# Patient Record
Sex: Male | Born: 1950 | Race: White | Hispanic: No | Marital: Married | Smoking: Never smoker
Health system: Southern US, Community
[De-identification: ages and names within clinical notes are randomized; demographics above are authoritative.]

## PROBLEM LIST (undated history)

## (undated) DIAGNOSIS — I480 Paroxysmal atrial fibrillation: Secondary | ICD-10-CM

## (undated) DIAGNOSIS — E119 Type 2 diabetes mellitus without complications: Secondary | ICD-10-CM

## (undated) DIAGNOSIS — Z9889 Other specified postprocedural states: Secondary | ICD-10-CM

## (undated) DIAGNOSIS — M545 Low back pain, unspecified: Secondary | ICD-10-CM

## (undated) DIAGNOSIS — G4700 Insomnia, unspecified: Secondary | ICD-10-CM

## (undated) DIAGNOSIS — S83519A Sprain of anterior cruciate ligament of unspecified knee, initial encounter: Secondary | ICD-10-CM

## (undated) DIAGNOSIS — K429 Umbilical hernia without obstruction or gangrene: Secondary | ICD-10-CM

## (undated) DIAGNOSIS — E669 Obesity, unspecified: Secondary | ICD-10-CM

## (undated) DIAGNOSIS — E7800 Pure hypercholesterolemia, unspecified: Secondary | ICD-10-CM

## (undated) DIAGNOSIS — E785 Hyperlipidemia, unspecified: Secondary | ICD-10-CM

## (undated) DIAGNOSIS — M25511 Pain in right shoulder: Secondary | ICD-10-CM

## (undated) DIAGNOSIS — Z9989 Dependence on other enabling machines and devices: Secondary | ICD-10-CM

## (undated) DIAGNOSIS — I4892 Unspecified atrial flutter: Secondary | ICD-10-CM

## (undated) DIAGNOSIS — S8290XA Unspecified fracture of unspecified lower leg, initial encounter for closed fracture: Secondary | ICD-10-CM

## (undated) DIAGNOSIS — I10 Essential (primary) hypertension: Secondary | ICD-10-CM

## (undated) DIAGNOSIS — I82409 Acute embolism and thrombosis of unspecified deep veins of unspecified lower extremity: Secondary | ICD-10-CM

## (undated) DIAGNOSIS — Z8679 Personal history of other diseases of the circulatory system: Secondary | ICD-10-CM

## (undated) DIAGNOSIS — G4733 Obstructive sleep apnea (adult) (pediatric): Secondary | ICD-10-CM

## (undated) DIAGNOSIS — I4891 Unspecified atrial fibrillation: Secondary | ICD-10-CM

## (undated) HISTORY — DX: Dependence on other enabling machines and devices: Z99.89

## (undated) HISTORY — DX: Obstructive sleep apnea (adult) (pediatric): G47.33

## (undated) HISTORY — DX: Acute embolism and thrombosis of unspecified deep veins of unspecified lower extremity: I82.409

## (undated) HISTORY — DX: Low back pain: M54.5

## (undated) HISTORY — DX: Hyperlipidemia, unspecified: E78.5

## (undated) HISTORY — DX: Paroxysmal atrial fibrillation: I48.0

## (undated) HISTORY — DX: Sprain of anterior cruciate ligament of unspecified knee, initial encounter: S83.519A

## (undated) HISTORY — DX: Pain in right shoulder: M25.511

## (undated) HISTORY — DX: Unspecified fracture of unspecified lower leg, initial encounter for closed fracture: S82.90XA

## (undated) HISTORY — DX: Pure hypercholesterolemia, unspecified: E78.00

## (undated) HISTORY — DX: Unspecified atrial fibrillation: I48.91

## (undated) HISTORY — DX: Personal history of other diseases of the circulatory system: Z86.79

## (undated) HISTORY — DX: Insomnia, unspecified: G47.00

## (undated) HISTORY — DX: Essential (primary) hypertension: I10

## (undated) HISTORY — DX: Other specified postprocedural states: Z98.890

## (undated) HISTORY — DX: Obesity, unspecified: E66.9

## (undated) HISTORY — DX: Low back pain, unspecified: M54.50

## (undated) HISTORY — DX: Umbilical hernia without obstruction or gangrene: K42.9

## (undated) HISTORY — DX: Unspecified atrial flutter: I48.92

---
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
## 1994-03-15 HISTORY — PX: OTHER SURGICAL HISTORY: SHX169

## 1995-03-16 HISTORY — PX: OTHER SURGICAL HISTORY: SHX169

## 2000-09-16 ENCOUNTER — Inpatient Hospital Stay (HOSPITAL_COMMUNITY): Admission: AD | Admit: 2000-09-16 | Discharge: 2000-09-19 | Payer: Self-pay | Admitting: Interventional Cardiology

## 2001-12-26 ENCOUNTER — Ambulatory Visit (HOSPITAL_COMMUNITY): Admission: RE | Admit: 2001-12-26 | Discharge: 2001-12-26 | Payer: Self-pay | Admitting: Interventional Cardiology

## 2001-12-26 HISTORY — PX: CARDIAC CATHETERIZATION: SHX172

## 2002-01-13 HISTORY — PX: OTHER SURGICAL HISTORY: SHX169

## 2002-02-01 ENCOUNTER — Ambulatory Visit (HOSPITAL_COMMUNITY): Admission: RE | Admit: 2002-02-01 | Discharge: 2002-02-02 | Payer: Self-pay | Admitting: Internal Medicine

## 2002-02-04 ENCOUNTER — Emergency Department (HOSPITAL_COMMUNITY): Admission: EM | Admit: 2002-02-04 | Discharge: 2002-02-04 | Payer: Self-pay | Admitting: Emergency Medicine

## 2006-03-15 DIAGNOSIS — Z8679 Personal history of other diseases of the circulatory system: Secondary | ICD-10-CM

## 2006-03-15 DIAGNOSIS — Z9889 Other specified postprocedural states: Secondary | ICD-10-CM

## 2006-03-15 HISTORY — DX: Personal history of other diseases of the circulatory system: Z86.79

## 2006-03-15 HISTORY — DX: Other specified postprocedural states: Z98.890

## 2010-03-15 DIAGNOSIS — I4891 Unspecified atrial fibrillation: Secondary | ICD-10-CM

## 2010-03-15 HISTORY — DX: Unspecified atrial fibrillation: I48.91

## 2010-04-04 ENCOUNTER — Encounter: Payer: Self-pay | Admitting: Interventional Cardiology

## 2010-12-21 ENCOUNTER — Emergency Department (HOSPITAL_COMMUNITY): Payer: BC Managed Care – PPO

## 2010-12-21 ENCOUNTER — Emergency Department (HOSPITAL_COMMUNITY)
Admission: EM | Admit: 2010-12-21 | Discharge: 2010-12-21 | Disposition: A | Discharge status: Home / Self Care | Payer: BC Managed Care – PPO | Attending: Emergency Medicine | Admitting: Emergency Medicine

## 2010-12-21 DIAGNOSIS — I10 Essential (primary) hypertension: Secondary | ICD-10-CM | POA: Insufficient documentation

## 2010-12-21 DIAGNOSIS — I4891 Unspecified atrial fibrillation: Secondary | ICD-10-CM | POA: Insufficient documentation

## 2010-12-21 DIAGNOSIS — E7800 Pure hypercholesterolemia, unspecified: Secondary | ICD-10-CM | POA: Insufficient documentation

## 2010-12-21 DIAGNOSIS — R002 Palpitations: Secondary | ICD-10-CM | POA: Insufficient documentation

## 2010-12-21 LAB — CBC
HCT: 42.8 % (ref 39.0–52.0)
MCHC: 35.3 g/dL (ref 30.0–36.0)
Platelets: 180 10*3/uL (ref 150–400)
RDW: 13.4 % (ref 11.5–15.5)
WBC: 6.9 10*3/uL (ref 4.0–10.5)

## 2010-12-21 LAB — DIFFERENTIAL
Basophils Absolute: 0.1 10*3/uL (ref 0.0–0.1)
Basophils Relative: 1 % (ref 0–1)
Eosinophils Absolute: 0.7 10*3/uL (ref 0.0–0.7)
Eosinophils Relative: 10 % — ABNORMAL HIGH (ref 0–5)
Monocytes Absolute: 0.6 10*3/uL (ref 0.1–1.0)

## 2010-12-21 LAB — PROTIME-INR: INR: 1 (ref 0.00–1.49)

## 2010-12-21 LAB — D-DIMER, QUANTITATIVE: D-Dimer, Quant: 0.28 ug/mL-FEU (ref 0.00–0.48)

## 2010-12-21 LAB — APTT: aPTT: 27 seconds (ref 24–37)

## 2010-12-21 LAB — BASIC METABOLIC PANEL
Calcium: 9.6 mg/dL (ref 8.4–10.5)
Chloride: 103 mEq/L (ref 96–112)
Creatinine, Ser: 1 mg/dL (ref 0.50–1.35)
GFR calc Af Amer: >90 mL/min (ref >90)
GFR calc non Af Amer: 81 mL/min — ABNORMAL LOW (ref >90)

## 2010-12-21 LAB — POCT I-STAT TROPONIN I

## 2010-12-30 ENCOUNTER — Institutional Professional Consult (permissible substitution): Payer: BC Managed Care – PPO | Admitting: Internal Medicine

## 2011-01-20 ENCOUNTER — Other Ambulatory Visit: Payer: Self-pay

## 2011-01-21 ENCOUNTER — Ambulatory Visit (INDEPENDENT_AMBULATORY_CARE_PROVIDER_SITE_OTHER): Payer: BC Managed Care – PPO | Admitting: Internal Medicine

## 2011-01-21 ENCOUNTER — Encounter: Payer: Self-pay | Admitting: Internal Medicine

## 2011-01-21 DIAGNOSIS — I4891 Unspecified atrial fibrillation: Secondary | ICD-10-CM | POA: Insufficient documentation

## 2011-01-21 DIAGNOSIS — I10 Essential (primary) hypertension: Secondary | ICD-10-CM

## 2011-01-21 MED ORDER — FLECAINIDE ACETATE 100 MG PO TABS: ORAL_TABLET | ORAL | Status: DC

## 2011-01-21 NOTE — Assessment & Plan Note (Signed)
Today we discussed the treatment options regarding his atrial fibrillation. One option will be just to continue his current medical therapy. A second option would be to try flecainide. This could be delivered either on a daily basis taking 100 mg twice a day or as a pill in the pocket taking 200 mg when he goes into atrial fibrillation. We discussed the advantages and disadvantages of each approach. For now he will try the pill in the pocket treatment with flecainide 200 mg taken as needed and the patient has gone into atrial fibrillation for more than 30 minutes. Ultimately, if he ends up taking flecainide several times a week, and I will recommend regular twice daily therapy.

## 2011-01-21 NOTE — Patient Instructions (Signed)
Your physician has recommended you make the following change in your medication:  1) Take Flecainide 100mg  two tablets as needed.  Your physician recommends that you schedule a follow-up appointment in: 3-4 months with Dr. Ladona Ridgel.

## 2011-01-21 NOTE — Progress Notes (Signed)
HPI Carlos Gutierrez is referred today by Dr. Katrinka Blazing for evaluation of atrial fibrillation. The patient is a 60 year old man who has a remote history of atrial flutter. In 2003, he underwent catheter ablation of typical atrial flutter restoring sinus rhythm. He had done well since then until approximately 2 months ago when he noted recurrent palpitations and was found to have atrial fibrillation. The patient has had 4 episodes. These typically last about a half a day. There is no syncope. No chest pain or shortness of breath. When I asked the patient how he feels and how he knows whether he is in atrial fibrillation or not, he states that he just does not feel quite right.  No Known Allergies   Current Outpatient Prescriptions  Medication Sig Dispense Refill  . aspirin 325 MG tablet Take 325 mg by mouth daily.        [name] atorvastatin (LIPITOR) 20 MG tablet Take 20 mg by mouth daily.        [name] loratadine (CLARITIN) 10 MG tablet Take 10 mg by mouth as needed.        . Misc Natural Products (ENERGY SUPPORT) CAPS Take 1 capsule by mouth daily.        . Multiple Vitamins-Minerals (MULTIVITAMIN WITH MINERALS) tablet Take 1 tablet by mouth daily.        . NON FORMULARY CPAP-as directed       . olmesartan-hydrochlorothiazide (BENICAR HCT) 20-12.5 MG per tablet Take 1 tablet by mouth daily.        [name] zolpidem (AMBIEN CR) 12.5 MG CR tablet Take 12.5 mg by mouth at bedtime as needed.        . flecainide (TAMBOCOR) 100 MG tablet Take two tablets as needed  60 tablet  1     Past Medical History  Diagnosis Date  . Dyslipidemia   . Concussion 1966    History of 4  . Broken leg 1968  . HTN (hypertension)   . Anxiety   . Status post ablation of atrial flutter 2008  . Lower back pain   . Obstructive sleep apnea on CPAP   . Atrial fibrillation 2012    ROS:   All systems reviewed and negative except as noted in the HPI.   Past Surgical History  Procedure Date  . Right knee surgery 1996    ACL, meniscus  problems  . Coronary angiography 1997    40% stenosis  . Ablation for atrial flutter 01/2002     Family History  Problem Relation Age of Onset  . Heart disease Father 2  . Hypertension Mother 52  . Diabetes Brother 42  . Hypertension Father   . Heart failure Father   . Diabetes Mother   . Hypertension Brother 2     History   Social History  . Marital Status: Married    Spouse Name: N/A    Number of Children: 2  . Years of Education: N/A   Occupational History  . product manager Other   Social History Main Topics  . Smoking status: Never Smoker   . Smokeless tobacco: Not on file  . Alcohol Use: 3.5 - 5.0 oz/week    7-10 drink(s) per week     Glasses of wine per week  . Drug Use: No  . Sexually Active: Not on file   Other Topics Concern  . Not on file   Social History Narrative   Caffeine: yes, coffee 2-3 cups daily, tea-rare. Exercise-yes, 2-3X weekly, jog/run. 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 Marital Status: Married. Children: 2 children-grown        BP 132/72  Pulse 54  Ht 6\' 1"  (1.854 m)  Wt 229 lb 12.8 oz (104.237 kg)  BMI 30.32 kg/m2  Physical Exam:  Well appearing NAD HEENT: Unremarkable Neck:  No JVD, no thyromegally Lymphatics:  No adenopathy Back:  No CVA tenderness Lungs:  Clear with no wheezes, rales, or rhonchi. HEART:  Regular bradycardic rhythm, no murmurs, no rubs, no clicks Abd:  soft, positive bowel sounds, no organomegally, no rebound, no guarding Ext:  2 plus pulses, no edema, no cyanosis, no clubbing Skin:  No rashes no nodules Neuro:  CN II through XII intact, motor grossly intact  EKG Sinus bradycardia.  Assess/Plan:

## 2011-01-21 NOTE — Assessment & Plan Note (Signed)
His blood pressure appears to be well-controlled. I've asked that he refrain from drinking more than one alcoholic beverage per day. He will maintain a low-sodium diet.

## 2011-04-16 ENCOUNTER — Ambulatory Visit (INDEPENDENT_AMBULATORY_CARE_PROVIDER_SITE_OTHER): Payer: BC Managed Care – PPO | Admitting: Internal Medicine

## 2011-04-16 ENCOUNTER — Encounter: Payer: Self-pay | Admitting: Internal Medicine

## 2011-04-16 DIAGNOSIS — I10 Essential (primary) hypertension: Secondary | ICD-10-CM

## 2011-04-16 DIAGNOSIS — I4891 Unspecified atrial fibrillation: Secondary | ICD-10-CM

## 2011-04-16 NOTE — Progress Notes (Signed)
HPI [name] returns today for followup. He is a very pleasant middle-age man with a history of atrial flutter status post catheter ablation. 10 years after ablation, he has developed paroxysmal atrial fibrillation. I saw the patient last several months ago at that time his episodes of A. fib that were short lived and infrequent. I recommended that he take flecainide as needed. Since then he has taken flecainide 3 times. He has got a rhythm for 6-8 hours but always returns. No syncope, chest pain, or shortness of breath. No Known Allergies   Current Outpatient Prescriptions  Medication Sig Dispense Refill  . aspirin 325 MG tablet Take 325 mg by mouth daily.        [name] atorvastatin (LIPITOR) 20 MG tablet Take 20 mg by mouth daily.        . flecainide (TAMBOCOR) 100 MG tablet Take two tablets as needed  60 tablet  1  . loratadine (CLARITIN) 10 MG tablet Take 10 mg by mouth as needed.        . Misc Natural Products (ENERGY SUPPORT) CAPS Take 1 capsule by mouth daily.        . Multiple Vitamins-Minerals (MULTIVITAMIN WITH MINERALS) tablet Take 1 tablet by mouth daily.        . NON FORMULARY CPAP-as directed       . olmesartan-hydrochlorothiazide (BENICAR HCT) 20-12.5 MG per tablet Take 1 tablet by mouth daily.        [name] zolpidem (AMBIEN CR) 12.5 MG CR tablet Take 12.5 mg by mouth at bedtime as needed.           Past Medical History  Diagnosis Date  . Dyslipidemia   . Concussion 1966    History of 4  . Broken leg 1968  . HTN (hypertension)   . Anxiety   . Status post ablation of atrial flutter 2008  . Lower back pain   . Obstructive sleep apnea on CPAP   . Atrial fibrillation 2012    ROS:   All systems reviewed and negative except as noted in the HPI.   Past Surgical History  Procedure Date  . Right knee surgery 1996    ACL, meniscus problems  . Coronary angiography 1997    40% stenosis  . Ablation for atrial flutter 01/2002  . Cardiac catheterization 12/26/01     Family  History  Problem Relation Age of Onset  . Heart disease Father 45  . Hypertension Mother 12  . Diabetes Brother 90  . Hypertension Father   . Heart failure Father   . Diabetes Mother   . Hypertension Brother 76     History   Social History  . Marital Status: Married    Spouse Name: N/A    Number of Children: 2  . Years of Education: N/A   Occupational History  . product manager Other   Social History Main Topics  . Smoking status: Never Smoker   . Smokeless tobacco: Not on file  . Alcohol Use: 3.5 - 5.0 oz/week    7-10 drink(s) per week     Glasses of wine per week  . Drug Use: No  . Sexually Active: Not on file   Other Topics Concern  . Not on file   Social History Narrative   Caffeine: yes, coffee 2-3 cups daily, tea-rare. Exercise-yes, 2-3X weekly, jog/run. 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 Marital Status: Married. Children: 2 children-grown  BP 114/74  Pulse 52  Ht 6\' 1"  (1.854 m)  Wt 105.235 kg (232 lb)  BMI 30.61 kg/m2  Physical Exam:  Well appearing middle-aged man, NAD HEENT: Unremarkable Neck:  No JVD, no thyromegally Lungs:  Clear with no wheezes, rales, or rhonchi. HEART:  Regular rate rhythm, no murmurs, no rubs, no clicks Abd:  soft, positive bowel sounds, no organomegally, no rebound, no guarding Ext:  2 plus pulses, no edema, no cyanosis, no clubbing Skin:  No rashes no nodules Neuro:  CN II through XII intact, motor grossly intact  Assess/Plan:

## 2011-04-16 NOTE — Patient Instructions (Signed)
Your physician wants you to follow-up in: 6 months with Dr Taylor You will receive a reminder letter in the mail two months in advance. If you don't receive a letter, please call our office to schedule the follow-up appointment.  

## 2011-04-16 NOTE — Assessment & Plan Note (Signed)
His atrial fibrillation appears to be well-controlled. He will continue with his approach using flecainide and a pill in the pocket. I'll see him back in several months.

## 2011-04-16 NOTE — Assessment & Plan Note (Signed)
His blood pressure is well controlled. He will continue his current medical therapy. 

## 2012-07-25 ENCOUNTER — Other Ambulatory Visit: Payer: Self-pay | Admitting: *Deleted

## 2012-07-25 DIAGNOSIS — I4891 Unspecified atrial fibrillation: Secondary | ICD-10-CM

## 2012-07-25 MED ORDER — FLECAINIDE ACETATE 100 MG PO TABS: ORAL_TABLET | ORAL | Status: DC

## 2012-07-25 NOTE — Telephone Encounter (Signed)
Fax Received. Refill Completed. Carlos Gutierrez (R.M.A)   

## 2012-08-23 ENCOUNTER — Other Ambulatory Visit: Payer: Self-pay

## 2012-08-23 DIAGNOSIS — R229 Localized swelling, mass and lump, unspecified: Secondary | ICD-10-CM

## 2012-08-25 ENCOUNTER — Ambulatory Visit
Admission: RE | Admit: 2012-08-25 | Discharge: 2012-08-25 | Disposition: A | Discharge status: Home / Self Care | Payer: BC Managed Care – PPO | Source: Ambulatory Visit

## 2012-08-25 DIAGNOSIS — R229 Localized swelling, mass and lump, unspecified: Secondary | ICD-10-CM

## 2012-09-21 ENCOUNTER — Ambulatory Visit: Payer: BC Managed Care – PPO | Admitting: Internal Medicine

## 2012-09-27 ENCOUNTER — Encounter: Payer: Self-pay | Admitting: Internal Medicine

## 2013-01-18 ENCOUNTER — Other Ambulatory Visit: Payer: Self-pay | Admitting: Interventional Cardiology

## 2013-03-23 ENCOUNTER — Encounter: Payer: Self-pay | Admitting: Internal Medicine

## 2013-03-23 ENCOUNTER — Ambulatory Visit (INDEPENDENT_AMBULATORY_CARE_PROVIDER_SITE_OTHER): Payer: BC Managed Care – PPO | Admitting: Internal Medicine

## 2013-03-23 VITALS — BP 132/84 | HR 66 | Ht 73.0 in | Wt 233.1 lb

## 2013-03-23 DIAGNOSIS — I4891 Unspecified atrial fibrillation: Secondary | ICD-10-CM

## 2013-03-23 MED ORDER — FLECAINIDE ACETATE 100 MG PO TABS: ORAL_TABLET | ORAL | Status: DC

## 2013-03-23 NOTE — Progress Notes (Signed)
HPI Carlos Gutierrez returns today for followup. He is a very pleasant middle-age man with a history of atrial flutter, status post catheter ablation, who then developed atrial fibrillation approximately 10 years after his ablation. Over the last couple of years, he has taken flecainide on an as-needed basis. He's had no episodes of symptomatic atrial fibrillation for the last 2-3 months. Prior to that he had a couple episodes per week, which would always resolve with 200 mg of flecainide. He is otherwise been stable. He denies chest pain, shortness of breath, or syncope. He exercises a regular basis. No peripheral edema. He does admit to occasional indiscretion with alcohol. This usually results in recurrent atrial fibrillation.  No Known Allergies   Current Outpatient Prescriptions  Medication Sig Dispense Refill  . aspirin 325 MG tablet Take 325 mg by mouth daily.        [name] atorvastatin (LIPITOR) 20 MG tablet Take 20 mg by mouth daily.        [name] BENICAR HCT 20-12.5 MG per tablet TAKE 1 TABLET DAILY  90 tablet  2  . flecainide (TAMBOCOR) 100 MG tablet Take two tablets as needed  60 tablet  0  . loratadine (CLARITIN) 10 MG tablet Take 10 mg by mouth as needed.        . Misc Natural Products (ENERGY SUPPORT) CAPS Take 1 capsule by mouth daily.        . Multiple Vitamins-Minerals (MULTIVITAMIN WITH MINERALS) tablet Take 1 tablet by mouth daily.        . NON FORMULARY CPAP-as directed        No current facility-administered medications for this visit.     Past Medical History  Diagnosis Date  . Dyslipidemia   . Concussion 1966    History of 4  . Broken leg 1968  . HTN (hypertension)   . Anxiety   . Status post ablation of atrial flutter 2008  . Lower back pain   . Obstructive sleep apnea on CPAP   . Atrial fibrillation 2012    ROS:   All systems reviewed and negative except as noted in the HPI.   Past Surgical History  Procedure Laterality Date  . Right knee surgery  1996   ACL, meniscus problems  . Coronary angiography  1997    40% stenosis  . Ablation for atrial flutter  01/2002  . Cardiac catheterization  12/26/01     Family History  Problem Relation Age of Onset  . Heart disease Father 38  . Hypertension Mother 9  . Diabetes Brother 44  . Hypertension Father   . Heart failure Father   . Diabetes Mother   . Hypertension Brother 24     History   Social History  . Marital Status: Married    Spouse Name: N/A    Number of Children: 2  . Years of Education: N/A   Occupational History  . product manager Other   Social History Main Topics  . Smoking status: Never Smoker   . Smokeless tobacco: Not on file  . Alcohol Use: 3.5 - 5 oz/week    7-10 drink(s) per week     Comment: Glasses of wine per week  . Drug Use: No  . Sexual Activity: Not on file   Other Topics Concern  . Not on file   Social History Narrative   Caffeine: yes, coffee 2-3 cups daily, tea-rare.    Exercise-yes, 2-3X weekly, jog/run.    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    Marital Status: Married.    Children: 2 children-grown                 BP 132/84  Pulse 66  Ht 6\' 1"  (1.854 m)  Wt 233 lb 1.9 oz (105.743 kg)  BMI 30.76 kg/m2  Physical Exam:  Well appearing 63 year old man, NAD HEENT: Unremarkable Neck:  No JVD, no thyromegally Back:  No CVA tenderness Lungs:  Clear with no wheezes, rales, or rhonchi. HEART:  Regular rate rhythm, no murmurs, no rubs, no clicks Abd:  soft, positive bowel sounds, no organomegally, no rebound, no guarding Ext:  2 plus pulses, no edema, no cyanosis, no clubbing Skin:  No rashes no nodules Neuro:  CN II through XII intact, motor grossly intact  EKG - normal sinus rhythm   Assess/Plan:

## 2013-03-23 NOTE — Patient Instructions (Signed)
Your physician wants you to follow-up in: [place] will receive a reminder letter in the mail two months in advance. If you don't receive a letter, please call our office to schedule the follow-up appointment.

## 2013-03-23 NOTE — Assessment & Plan Note (Signed)
He appears to be maintaining sinus rhythm fairly nicely. He is encouraged to reduce his alcohol consumption. He'll continue taking flecainide as a pill in the pocket. If his symptoms of atrial fibrillation increase in frequency, I would expect that we will need to change our strategy so that he is taking flecainide twice daily and I discussed this with the patient.

## 2013-03-28 ENCOUNTER — Ambulatory Visit: Payer: BC Managed Care – PPO | Admitting: Cardiology

## 2013-03-30 ENCOUNTER — Ambulatory Visit: Payer: BC Managed Care – PPO | Admitting: Cardiology

## 2013-04-13 ENCOUNTER — Ambulatory Visit (INDEPENDENT_AMBULATORY_CARE_PROVIDER_SITE_OTHER): Payer: BC Managed Care – PPO | Admitting: Cardiology

## 2013-04-13 ENCOUNTER — Encounter: Payer: Self-pay | Admitting: Cardiology

## 2013-04-13 VITALS — BP 134/80 | Ht 73.0 in | Wt 229.8 lb

## 2013-04-13 DIAGNOSIS — G4700 Insomnia, unspecified: Secondary | ICD-10-CM

## 2013-04-13 DIAGNOSIS — Z9989 Dependence on other enabling machines and devices: Principal | ICD-10-CM

## 2013-04-13 DIAGNOSIS — G4733 Obstructive sleep apnea (adult) (pediatric): Secondary | ICD-10-CM | POA: Insufficient documentation

## 2013-04-13 DIAGNOSIS — E669 Obesity, unspecified: Secondary | ICD-10-CM

## 2013-04-13 DIAGNOSIS — I10 Essential (primary) hypertension: Secondary | ICD-10-CM

## 2013-04-13 MED ORDER — ESZOPICLONE 3 MG PO TABS: 3.0000 mg | ORAL_TABLET | ORAL | Status: DC | PRN

## 2013-04-13 MED ORDER — ESZOPICLONE 3 MG PO TABS: 3.0000 mg | ORAL_TABLET | Freq: Every day | ORAL | Status: DC

## 2013-04-13 NOTE — Patient Instructions (Signed)
Your physician has recommended you make the following change in your medication: 1. Start Lunesta 3 MG QHS PRN  Your physician wants you to follow-up in: 6 Months with Dr Mallie Snooks will receive a reminder letter in the mail two months in advance. If you don't receive a letter, please call our office to schedule the follow-up appointment.

## 2013-04-13 NOTE — Progress Notes (Signed)
[place], [address] [place], [place]  [address] Phone: [phone] Fax:  [phone]  Date:  04/13/2013   ID:  Carlos Gutierrez, Carlos Gutierrez Jul 16, 1950, MRN [id number]  PCP:  No primary provider on file.  Cardiologist:  Fransico Him, MD     History of Present Illness: Carlos Gutierrez is a 63 y.o. male with a history of OSA, obesity and HTN presents today for followup.  He is doing well.  He tolerates his CPAP therapy without problems.  He does not feel rested when he gets up in the am.  He wakes up every night at 3 and then at 4 and has problems going back to sleep.  He thinks it is due to having a lot on his mind.  He has no daytime sleepiness.  He tolerates his nasal mask and feels the pressure is adequate.   Wt Readings from Last 3 Encounters:  04/13/13 229 lb 12.8 oz (104.237 kg)  03/23/13 233 lb 1.9 oz (105.743 kg)  04/16/11 232 lb (105.235 kg)     Past Medical History  Diagnosis Date  . Dyslipidemia   . Concussion 1966    History of 4  . Broken leg 1968  . Anxiety   . Status post ablation of atrial flutter 2008  . Lower back pain   . Obstructive sleep apnea on CPAP   . Atrial fibrillation 2012    s/p ablation  . HTN (hypertension)   . Obesity (BMI 30-39.9)     Current Outpatient Prescriptions  Medication Sig Dispense Refill  . aspirin 325 MG tablet Take 325 mg by mouth daily.        [name] atorvastatin (LIPITOR) 20 MG tablet Take 20 mg by mouth daily.        [name] BENICAR HCT 20-12.5 MG per tablet TAKE 1 TABLET DAILY  90 tablet  2  . flecainide (TAMBOCOR) 100 MG tablet Take two tablets as needed  60 tablet  12  . loratadine (CLARITIN) 10 MG tablet Take 10 mg by mouth as needed.        . Misc Natural Products (ENERGY SUPPORT) CAPS Take 1 capsule by mouth daily.        . Multiple Vitamins-Minerals (MULTIVITAMIN WITH MINERALS) tablet Take 1 tablet by mouth daily.        . NON FORMULARY CPAP-as directed        No current facility-administered medications for this visit.     Allergies:   No Known Allergies  Social History:  The patient  reports that he has never smoked. He does not have any smokeless tobacco history on file. He reports that he drinks about 3.5 ounces of alcohol per week. He reports that he does not use illicit drugs.   Family History:  The patient's family history includes Diabetes in his mother; Diabetes (age of onset: 76) in his brother; Heart disease (age of onset: 46) in his father; Heart failure in his father; Hypertension in his father; Hypertension (age of onset: 28) in his brother; Hypertension (age of onset: 74) in his mother.   ROS:  Please see the history of present illness.      All other systems reviewed and negative.   PHYSICAL EXAM: VS:  BP 134/80  Ht 6\' 1"  (1.854 m)  Wt 229 lb 12.8 oz (104.237 kg)  BMI 30.33 kg/m2 Well nourished, well developed, in no acute distress HEENT: normal Neck: no JVD Cardiac:  normal S1, S2; RRR; no murmur Lungs:  clear to auscultation bilaterally, no wheezing, rhonchi or rales Abd: soft, nontender, no hepatomegaly Ext: no edema Skin: warm and dry Neuro:  CNs 2-12 intact, no focal abnormalities noted       ASSESSMENT AND PLAN:  1. OSA on CPAP and tolerating well - his download today showed an AHI of 2/hr on 0cm H2O and 73% compliance in using more than 4 hours nightly. 2. HTN - well controlled  - continue Benicar HCT 3. Obesity 4. Insomnia - he has problems at night awakening in the middle of  The night.  He has tried [place] and it does not work very well for him and feels hung over.  I have recommended that we try Lunesta 3mg  qhs PRN.  I will start out with dispensing 5 tablets and if it helps will call in another prescription.   He is going on a trip to [place] soon so I have recommended that he try taking the [place] before he goes and if it helps I will give him a script for more tablets.    Followup with me in 6 months  Signed, Fransico Him, MD 04/13/2013 8:46 AM

## 2013-04-30 ENCOUNTER — Encounter: Payer: Self-pay | Admitting: Cardiology

## 2013-08-21 ENCOUNTER — Encounter: Payer: Self-pay | Admitting: Interventional Cardiology

## 2013-09-12 ENCOUNTER — Telehealth: Payer: Self-pay | Admitting: Interventional Cardiology

## 2013-09-12 NOTE — Telephone Encounter (Signed)
New message    Wife calling stating they found a deer tick on him yesterday . They called the PCP on yesterday they said risk was low will not prescribe  antbiotic  until patient feels bad. Wife is very upset .   Wife aware that Dr Tamala Julian is on vacation.

## 2013-09-17 NOTE — Telephone Encounter (Signed)
[email or web address] if further assist needed.

## 2013-09-24 ENCOUNTER — Ambulatory Visit: Payer: BC Managed Care – PPO | Admitting: Interventional Cardiology

## 2013-09-28 ENCOUNTER — Ambulatory Visit (INDEPENDENT_AMBULATORY_CARE_PROVIDER_SITE_OTHER): Payer: BC Managed Care – PPO | Admitting: Interventional Cardiology

## 2013-09-28 ENCOUNTER — Encounter: Payer: Self-pay | Admitting: Interventional Cardiology

## 2013-09-28 ENCOUNTER — Encounter: Payer: Self-pay | Admitting: *Deleted

## 2013-09-28 VITALS — BP 129/69 | HR 60 | Ht 73.0 in | Wt 233.0 lb

## 2013-09-28 DIAGNOSIS — I10 Essential (primary) hypertension: Secondary | ICD-10-CM

## 2013-09-28 DIAGNOSIS — I480 Paroxysmal atrial fibrillation: Secondary | ICD-10-CM

## 2013-09-28 DIAGNOSIS — Z79899 Other long term (current) drug therapy: Secondary | ICD-10-CM

## 2013-09-28 DIAGNOSIS — I483 Typical atrial flutter: Secondary | ICD-10-CM

## 2013-09-28 DIAGNOSIS — G4733 Obstructive sleep apnea (adult) (pediatric): Secondary | ICD-10-CM

## 2013-09-28 DIAGNOSIS — I4892 Unspecified atrial flutter: Secondary | ICD-10-CM

## 2013-09-28 DIAGNOSIS — Z9989 Dependence on other enabling machines and devices: Secondary | ICD-10-CM

## 2013-09-28 DIAGNOSIS — I4891 Unspecified atrial fibrillation: Secondary | ICD-10-CM

## 2013-09-28 NOTE — Patient Instructions (Signed)
Your physician recommends that you continue on your current medications as directed. Please refer to the Current Medication list given to you today.  Your physician wants you to follow-up in: 1 year. You will receive a reminder letter in the mail two months in advance. If you don't receive a letter, please call our office to schedule the follow-up appointment.  

## 2013-09-28 NOTE — Progress Notes (Signed)
Patient ID: DENNIE MOLTZ, male   DOB: 07/01/1950, 63 y.o.   MRN: [id number]    [address], [address] [place], [place]  [address] Phone: [phone] Fax:  [phone]  Date:  09/28/2013   ID:  Ean, Gettel 05-29-1950, MRN [id number]  PCP:  No primary provider on file.   ASSESSMENT:  1. Atrial fib/atrial flutter, stable without recurrences or complaint 2. Blood pressure is under control 3. Obstructive sleep apnea, treated 4. Flecainide therapy without complications and no recent administration  PLAN:  1. Encouraged him increase in aerobic activity 2. Weight loss 3. Clinical followup in one year   SUBJECTIVE: PRANIT OWENSBY is a 63 y.o. male who has had no specific cardiac events since the last office visit. He denies neurological complaints. Continues to take an aspirin daily. No orthopnea or PND. There is no peripheral edema or vascular complaints.   Wt Readings from Last 3 Encounters:  09/28/13 233 lb (105.688 kg)  04/13/13 229 lb 12.8 oz (104.237 kg)  03/23/13 233 lb 1.9 oz (105.743 kg)     Past Medical History  Diagnosis Date  . Dyslipidemia   . Concussion 1966    History of 4  . Broken leg 1968  . Anxiety   . Status post ablation of atrial flutter 2008  . Low back pain   . Obstructive sleep apnea on CPAP     Mild  . Atrial fibrillation 2012    Rare occurrences. On ASA & Flecainide prn  . Essential hypertension, benign   . Obesity (BMI 30-39.9)   . Atrial flutter     s/p ablation 2008. On Flecainide prn.  . Right shoulder pain     Intermittent  . Pure hypercholesterolemia     On Lipitor   . PAF (paroxysmal atrial fibrillation)     Rare occurrences. On ASA & Flecainide prn    Current Outpatient Prescriptions  Medication Sig Dispense Refill  . aspirin 325 MG tablet Take 325 mg by mouth daily.        [name] atorvastatin (LIPITOR) 20 MG tablet Take 20 mg by mouth daily.        [name] BENICAR HCT 20-12.5 MG per tablet TAKE 1 TABLET DAILY  90  tablet  2  . Eszopiclone 3 MG TABS Take 1 tablet (3 mg total) by mouth as needed. Take immediately before bedtime  5 tablet  0  . flecainide (TAMBOCOR) 100 MG tablet Take two tablets as needed  60 tablet  12  . loratadine (CLARITIN) 10 MG tablet Take 10 mg by mouth as needed.        . Misc Natural Products (ENERGY SUPPORT) CAPS Take 1 capsule by mouth daily.        . Multiple Vitamins-Minerals (MULTIVITAMIN WITH MINERALS) tablet Take 1 tablet by mouth daily.        . NON FORMULARY CPAP-as directed        No current facility-administered medications for this visit.    Allergies:   No Known Allergies  Social History:  The patient  reports that he has never smoked. He does not have any smokeless tobacco history on file. He reports that he drinks about 4.2 - 6 ounces of alcohol per week. He reports that he does not use illicit drugs.   ROS:  Please see the history of present illness. He denies palpitations. No blood in his urine or stool.   All other systems reviewed and negative.   OBJECTIVE:  VS:  BP 129/69  Pulse 60  Ht 6\' 1"  (1.854 m)  Wt 233 lb (105.688 kg)  BMI 30.75 kg/m2 Well nourished, well developed, in no acute distress, has gained weight since the last office visit HEENT: normal Neck: JVD flat. Carotid bruit absent  Cardiac:  normal S1, S2; RRR; no murmur Lungs:  clear to auscultation bilaterally, no wheezing, rhonchi or rales Abd: soft, nontender, no hepatomegaly Ext: Edema absent. Pulses 2+ and symmetric Skin: warm and dry Neuro:  CNs 2-12 intact, no focal abnormalities noted  EKG:  None       Signed, Illene Labrador III, MD 09/28/2013 8:16 AM

## 2013-10-22 ENCOUNTER — Other Ambulatory Visit: Payer: Self-pay | Admitting: Interventional Cardiology

## 2014-04-23 ENCOUNTER — Other Ambulatory Visit: Payer: Self-pay | Admitting: Interventional Cardiology

## 2014-08-25 ENCOUNTER — Emergency Department (HOSPITAL_COMMUNITY): Payer: BLUE CROSS/BLUE SHIELD

## 2014-08-25 ENCOUNTER — Encounter (HOSPITAL_COMMUNITY): Payer: Self-pay

## 2014-08-25 ENCOUNTER — Emergency Department (HOSPITAL_COMMUNITY)
Admission: EM | Admit: 2014-08-25 | Discharge: 2014-08-25 | Disposition: A | Discharge status: Home / Self Care | Payer: BLUE CROSS/BLUE SHIELD | Attending: Emergency Medicine | Admitting: Emergency Medicine

## 2014-08-25 DIAGNOSIS — Z8659 Personal history of other mental and behavioral disorders: Secondary | ICD-10-CM | POA: Insufficient documentation

## 2014-08-25 DIAGNOSIS — Z87828 Personal history of other (healed) physical injury and trauma: Secondary | ICD-10-CM | POA: Insufficient documentation

## 2014-08-25 DIAGNOSIS — G4733 Obstructive sleep apnea (adult) (pediatric): Secondary | ICD-10-CM | POA: Insufficient documentation

## 2014-08-25 DIAGNOSIS — R0789 Other chest pain: Secondary | ICD-10-CM | POA: Insufficient documentation

## 2014-08-25 DIAGNOSIS — Z8781 Personal history of (healed) traumatic fracture: Secondary | ICD-10-CM | POA: Insufficient documentation

## 2014-08-25 DIAGNOSIS — I480 Paroxysmal atrial fibrillation: Secondary | ICD-10-CM | POA: Diagnosis not present

## 2014-08-25 DIAGNOSIS — Z79899 Other long term (current) drug therapy: Secondary | ICD-10-CM | POA: Diagnosis not present

## 2014-08-25 DIAGNOSIS — E669 Obesity, unspecified: Secondary | ICD-10-CM | POA: Diagnosis not present

## 2014-08-25 DIAGNOSIS — N289 Disorder of kidney and ureter, unspecified: Secondary | ICD-10-CM | POA: Diagnosis not present

## 2014-08-25 DIAGNOSIS — Z7982 Long term (current) use of aspirin: Secondary | ICD-10-CM | POA: Diagnosis not present

## 2014-08-25 DIAGNOSIS — R079 Chest pain, unspecified: Secondary | ICD-10-CM

## 2014-08-25 DIAGNOSIS — Z9981 Dependence on supplemental oxygen: Secondary | ICD-10-CM | POA: Diagnosis not present

## 2014-08-25 DIAGNOSIS — I10 Essential (primary) hypertension: Secondary | ICD-10-CM | POA: Insufficient documentation

## 2014-08-25 DIAGNOSIS — Z9889 Other specified postprocedural states: Secondary | ICD-10-CM | POA: Diagnosis not present

## 2014-08-25 DIAGNOSIS — E785 Hyperlipidemia, unspecified: Secondary | ICD-10-CM | POA: Insufficient documentation

## 2014-08-25 LAB — BASIC METABOLIC PANEL
Anion gap: 9 (ref 5–15)
BUN: 15 mg/dL (ref 6–20)
CALCIUM: 9.9 mg/dL (ref 8.9–10.3)
CO2: 24 mmol/L (ref 22–32)
Chloride: 106 mmol/L (ref 101–111)
Creatinine, Ser: 1.26 mg/dL — ABNORMAL HIGH (ref 0.61–1.24)
GFR calc Af Amer: >60 mL/min (ref >60)
GFR, EST NON AFRICAN AMERICAN: 59 mL/min — AB (ref >60)
GLUCOSE: 152 mg/dL — AB (ref 65–99)
Potassium: 3.9 mmol/L (ref 3.5–5.1)
Sodium: 139 mmol/L (ref 135–145)

## 2014-08-25 LAB — CBC
HCT: 46.3 % (ref 39.0–52.0)
HEMOGLOBIN: 15.9 g/dL (ref 13.0–17.0)
MCH: 30.3 pg (ref 26.0–34.0)
MCHC: 34.3 g/dL (ref 30.0–36.0)
MCV: 88.2 fL (ref 78.0–100.0)
PLATELETS: 184 10*3/uL (ref 150–400)
RBC: 5.25 MIL/uL (ref 4.22–5.81)
RDW: 13.8 % (ref 11.5–15.5)
WBC: 6.2 10*3/uL (ref 4.0–10.5)

## 2014-08-25 LAB — I-STAT TROPONIN, ED: TROPONIN I, POC: 0 ng/mL (ref 0.00–0.08)

## 2014-08-25 MED ORDER — PANTOPRAZOLE SODIUM 40 MG PO TBEC
40.0000 mg | DELAYED_RELEASE_TABLET | Freq: Once | ORAL | Status: AC
  Administered 2014-08-25: 40 mg via ORAL
  Filled 2014-08-25: qty 1

## 2014-08-25 MED ORDER — PANTOPRAZOLE SODIUM 40 MG PO TBEC: 40.0000 mg | DELAYED_RELEASE_TABLET | Freq: Every day | ORAL | Status: DC

## 2014-08-25 MED ORDER — GI COCKTAIL ~~LOC~~
30.0000 mL | Freq: Once | ORAL | Status: AC
  Administered 2014-08-25: 30 mL via ORAL
  Filled 2014-08-25: qty 30

## 2014-08-25 NOTE — ED Provider Notes (Signed)
CSN: [id number]     Arrival date & time 08/25/14  1435 History   First MD Initiated Contact with Patient 08/25/14 1528     Chief Complaint  Patient presents with  . Chest Pain     (Consider location/radiation/quality/duration/timing/severity/associated sxs/prior Treatment) Patient is a 64 y.o. male presenting with chest pain. The history is provided by the patient.  Chest Pain He states that he took 2 vitamin pills this morning and the knot stuck. He had taken them with orange juice and he did spit up the arm shoes. After that, he had a glass of water which he also spit up. However, as the day has gone on, he was able to eat. However, he has had a sense of a dull achy pain in his chest with some symptoms of indigestion. Nothing makes it better, nothing makes it worse. He rates his pain at 5/10. There is no associated dyspnea or diaphoresis. There is mild nausea but no vomiting. On several occasions, he has had coughing episodes where is brought up phlegm but no actual vomiting. He relates that he sat she had similar episodes of indigestion/chest pain over the last 4 months. They come on without any particular pattern relating to body position or exertion. He has had occasional nocturnal symptoms as well. He relates that he has had his tress test as recently as 2 years ago which was normal. He is being treated for paroxysmal atrial fibrillation for which he takes flecainide on an as-needed basis. Of note, he has taken 2 doses of aspirin 325 mg today.  Past Medical History  Diagnosis Date  . Dyslipidemia   . Concussion 1966    History of 4  . Broken leg 1968  . Anxiety   . Status post ablation of atrial flutter 2008  . Low back pain   . Obstructive sleep apnea on CPAP     Mild  . Atrial fibrillation 2012    Rare occurrences. On ASA & Flecainide prn  . Essential hypertension, benign   . Obesity (BMI 30-39.9)   . Atrial flutter     s/p ablation 2008. On Flecainide prn.  . Right shoulder  pain     Intermittent  . Pure hypercholesterolemia     On Lipitor   . PAF (paroxysmal atrial fibrillation)     Rare occurrences. On ASA & Flecainide prn   Past Surgical History  Procedure Laterality Date  . Right knee surgery  1996    ACL, meniscus problems  . Coronary angiography  1997    40% stenosis  . Ablation for atrial flutter  01/2002  . Cardiac catheterization  12/26/01   Family History  Problem Relation Age of Onset  . Heart disease Father 27  . Hypertension Mother 73  . Diabetes Brother 69  . Hypertension Father   . Heart failure Father   . Diabetes Mother   . Hypertension Brother 54   History  Substance Use Topics  . Smoking status: Never Smoker   . Smokeless tobacco: Not on file  . Alcohol Use: 4.2 - 6.0 oz/week    7-10 Glasses of wine per week    Review of Systems  Cardiovascular: Positive for chest pain.  All other systems reviewed and are negative.     Allergies  Review of patient's allergies indicates no known allergies.  Home Medications   Prior to Admission medications   Medication Sig Start Date End Date Taking? Authorizing Provider  aspirin 325 MG tablet Take 325 mg  by mouth daily.      Historical Provider, MD  atorvastatin (LIPITOR) 20 MG tablet Take 20 mg by mouth daily.      Historical Provider, MD  BENICAR HCT 20-12.5 MG per tablet TAKE 1 TABLET DAILY 04/25/14   Belva Crome, MD  Eszopiclone 3 MG TABS Take 1 tablet (3 mg total) by mouth as needed. Take immediately before bedtime 04/13/13   Sueanne Margarita, MD  flecainide (TAMBOCOR) 100 MG tablet Take two tablets as needed 03/23/13   Evans Lance, MD  loratadine (CLARITIN) 10 MG tablet Take 10 mg by mouth as needed.      Historical Provider, MD  Misc Natural Products (ENERGY SUPPORT) CAPS Take 1 capsule by mouth daily.      Historical Provider, MD  Multiple Vitamins-Minerals (MULTIVITAMIN WITH MINERALS) tablet Take 1 tablet by mouth daily.      Historical Provider, MD  NON FORMULARY CPAP-as  directed     Historical Provider, MD   BP 136/78 mmHg  Pulse 65  Temp(Src) 98.6 F (37 C) (Oral)  Resp 16  Ht 6' (1.829 m)  Wt 233 lb 14.4 oz (106.096 kg)  BMI 31.72 kg/m2  SpO2 96% Physical Exam  Nursing note and vitals reviewed.  64 year old male, resting comfortably and in no acute distress. Vital signs are normal. Oxygen saturation is 96%, which is normal. Head is normocephalic and atraumatic. PERRLA, EOMI. Oropharynx is clear. Neck is nontender and supple without adenopathy or JVD. Back is nontender and there is no CVA tenderness. Lungs are clear without rales, wheezes, or rhonchi. Chest is nontender. Heart has regular rate and rhythm without murmur. Abdomen is soft, flat, with mild epigastric tenderness. There are no masses or hepatosplenomegaly and peristalsis is normoactive. Extremities have no cyanosis or edema, full range of motion is present. Skin is warm and dry without rash. Neurologic: Mental status is normal, cranial nerves are intact, there are no motor or sensory deficits.  ED Course  Procedures (including critical care time) Labs Review Results for orders placed or performed during the hospital encounter of 08/25/14  CBC  Result Value Ref Range   WBC 6.2 4.0 - 10.5 K/uL   RBC 5.25 4.22 - 5.81 MIL/uL   Hemoglobin 15.9 13.0 - 17.0 g/dL   HCT 46.3 39.0 - 52.0 %   MCV 88.2 78.0 - 100.0 fL   MCH 30.3 26.0 - 34.0 pg   MCHC 34.3 30.0 - 36.0 g/dL   RDW 13.8 11.5 - 15.5 %   Platelets 184 150 - 400 K/uL  Basic metabolic panel  Result Value Ref Range   Sodium 139 135 - 145 mmol/L   Potassium 3.9 3.5 - 5.1 mmol/L   Chloride 106 101 - 111 mmol/L   CO2 24 22 - 32 mmol/L   Glucose, Bld 152 (H) 65 - 99 mg/dL   BUN 15 6 - 20 mg/dL   Creatinine, Ser 1.26 (H) 0.61 - 1.24 mg/dL   Calcium 9.9 8.9 - 10.3 mg/dL   GFR calc non Af Amer 59 (L) >60 mL/min   GFR calc Af Amer >60 >60 mL/min   Anion gap 9 5 - 15  I-stat troponin, ED  (not at [facility], [facility])  Result Value Ref  Range   Troponin i, poc 0.00 0.00 - 0.08 ng/mL   Comment 3           Imaging Review Dg Chest 2 View  08/25/2014   CLINICAL DATA:  64 year old male with  left-sided and central chest pain, with some radiation to left arm and tingling in the hand. Nausea and dizziness intermittently over the past 1 month, worsening today.  EXAM: CHEST  2 VIEW  COMPARISON:  Chest x-ray 12/21/2010.  FINDINGS: Lung volumes are normal. No consolidative airspace disease. No pleural effusions. No pneumothorax. No pulmonary nodule or mass noted. Pulmonary vasculature and the cardiomediastinal silhouette are within normal limits.  IMPRESSION: No radiographic evidence of acute cardiopulmonary disease.   Electronically Signed   By: Vinnie Langton M.D.   On: 08/25/2014 15:18     EKG Interpretation   Date/Time:  Sunday August 25 2014 14:42:25 EDT Ventricular Rate:  62 PR Interval:  222 QRS Duration: 114 QT Interval:  420 QTC Calculation: 426 R Axis:   57 Text Interpretation:  Sinus rhythm with 1st degree A-V block Otherwise  normal ECG When compared with ECG of 12/21/2010, Sinus rhythm with first  degree AV block has replaced Atrial fibrillation Confirmed by [facility]  MD,  Kerrion Kemppainen ([id number]) on 08/25/2014 3:30:05 PM      MDM   Final diagnoses:  Chest pain, unspecified chest pain type  Renal insufficiency    Chest discomfort of uncertain cause. I suspect that he has some degree of acid reflux. Old records are reviewed and he is being followed by cardiology for his paroxysmal atrial fibrillation and also obstructive sleep apnea. Creatinine is slightly increased over baseline but there has been 3-1/2 years since the last recorded creatinine. Borderline hyperglycemia is also noted. He probably should have a repeat stress test at some point, but today I will give him a therapeutic trial of a GI cocktail.  He had good relief of symptoms with GI cocktail. He is discharged with a prescription for pantoprazole. Is to follow up with  his cardiologist in the next week. Advised GI evaluation if cardiologist feels he has no underlying heart problems. Follow-up with PCP regarding borderline elevation of blood glucose and creatinine.  Delora Fuel, MD 71/69/67 [id number]

## 2014-08-25 NOTE — ED Notes (Signed)
Pt reports took 2 large vitamins this morning, felt like they got stuck, vomited phlegm.  After that started having left sided chest and arm pain.  Lasting 20 min to 1 hr.  Pt reports has had intermittant chest pain x 1 1/2 months but today worse.  Pt reports headache and indigestion.  Took ASA 325 mg @ 7am and another one at 2pm.

## 2014-08-25 NOTE — Discharge Instructions (Signed)
Make an appointment with your cardiologist. If you get a clean bill of health from him, then follow up with a gastroenterologist.  Pantoprazole tablets What is this medicine? PANTOPRAZOLE (pan TOE pra zole) prevents the production of acid in the stomach. It is used to treat gastroesophageal reflux disease (GERD), inflammation of the esophagus, and Zollinger-Ellison syndrome. This medicine may be used for other purposes; ask your health care provider or pharmacist if you have questions. COMMON BRAND NAME(S): Protonix What should I tell my health care provider before I take this medicine? They need to know if you have any of these conditions: -liver disease -low levels of magnesium in the blood -an unusual or allergic reaction to omeprazole, lansoprazole, pantoprazole, rabeprazole, other medicines, foods, dyes, or preservatives -pregnant or trying to get pregnant -breast-feeding How should I use this medicine? Take this medicine by mouth. Swallow the tablets whole with a drink of water. Follow the directions on the prescription label. Do not crush, break, or chew. Take your medicine at regular intervals. Do not take your medicine more often than directed. Talk to your pediatrician regarding the use of this medicine in children. While this drug may be prescribed for children as young as 5 years for selected conditions, precautions do apply. Overdosage: If you think you have taken too much of this medicine contact a poison control center or emergency room at once. NOTE: This medicine is only for you. Do not share this medicine with others. What if I miss a dose? If you miss a dose, take it as soon as you can. If it is almost time for your next dose, take only that dose. Do not take double or extra doses. What may interact with this medicine? Do not take this medicine with any of the following medications: -atazanavir -nelfinavir This medicine may also interact with the following  medications: -ampicillin -delavirdine -digoxin -diuretics -iron salts -medicines for fungal infections like ketoconazole, itraconazole and voriconazole -warfarin This list may not describe all possible interactions. Give your health care provider a list of all the medicines, herbs, non-prescription drugs, or dietary supplements you use. Also tell them if you smoke, drink alcohol, or use illegal drugs. Some items may interact with your medicine. What should I watch for while using this medicine? It can take several days before your stomach pain gets better. Check with your doctor or health care professional if your condition does not start to get better, or if it gets worse. You may need blood work done while you are taking this medicine. What side effects may I notice from receiving this medicine? Side effects that you should report to your doctor or health care professional as soon as possible: -allergic reactions like skin rash, itching or hives, swelling of the face, lips, or tongue -bone, muscle or joint pain -breathing problems -chest pain or chest tightness -dark yellow or brown urine -dizziness -fast, irregular heartbeat -feeling faint or lightheaded -fever or sore throat -muscle spasm -palpitations -redness, blistering, peeling or loosening of the skin, including inside the mouth -seizures -tremors -unusual bleeding or bruising -unusually weak or tired -yellowing of the eyes or skin Side effects that usually do not require medical attention (Report these to your doctor or health care professional if they continue or are bothersome.): -constipation -diarrhea -dry mouth -headache -nausea This list may not describe all possible side effects. Call your doctor for medical advice about side effects. You may report side effects to FDA at 1-800-FDA-1088. Where should I keep my medicine?  Keep out of the reach of children. Store at room temperature between 15 and 30 degrees C  (59 and 86 degrees F). Protect from light and moisture. Throw away any unused medicine after the expiration date. NOTE: This sheet is a summary. It may not cover all possible information. If you have questions about this medicine, talk to your doctor, pharmacist, or health care provider.  2015, Elsevier/Gold Standard. (2011-12-29 16:40:16)

## 2014-08-28 ENCOUNTER — Ambulatory Visit (INDEPENDENT_AMBULATORY_CARE_PROVIDER_SITE_OTHER): Payer: BLUE CROSS/BLUE SHIELD | Admitting: Internal Medicine

## 2014-08-28 ENCOUNTER — Encounter: Payer: Self-pay | Admitting: Internal Medicine

## 2014-08-28 VITALS — BP 124/80 | HR 60 | Ht 73.0 in | Wt 236.0 lb

## 2014-08-28 DIAGNOSIS — Z9989 Dependence on other enabling machines and devices: Secondary | ICD-10-CM

## 2014-08-28 DIAGNOSIS — I480 Paroxysmal atrial fibrillation: Secondary | ICD-10-CM | POA: Diagnosis not present

## 2014-08-28 DIAGNOSIS — G4733 Obstructive sleep apnea (adult) (pediatric): Secondary | ICD-10-CM | POA: Diagnosis not present

## 2014-08-28 DIAGNOSIS — I10 Essential (primary) hypertension: Secondary | ICD-10-CM

## 2014-08-28 DIAGNOSIS — R079 Chest pain, unspecified: Secondary | ICD-10-CM | POA: Diagnosis not present

## 2014-08-28 MED ORDER — FLECAINIDE ACETATE 100 MG PO TABS: ORAL_TABLET | ORAL | Status: DC

## 2014-08-28 NOTE — Progress Notes (Signed)
HPI Carlos Gutierrez returns today for followup. He is a very pleasant middle-age man with a history of atrial flutter, status post catheter ablation, who then developed atrial fibrillation approximately 10 years after his ablation. Over the last couple of years, he has taken flecainide on an as-needed basis. He has one or two episodes of atrial fib a month. They last for 2-4 hours after he has taken his flecainide. He is otherwise been stable. He denies chest pain, shortness of breath, or syncope. He exercises a regular basis. No peripheral edema. No Known Allergies   Current Outpatient Prescriptions  Medication Sig Dispense Refill  . aspirin 325 MG tablet Take 325 mg by mouth daily.      [name] atorvastatin (LIPITOR) 20 MG tablet Take 20 mg by mouth daily with supper.     [name] BENICAR HCT 20-12.5 MG per tablet TAKE 1 TABLET DAILY 90 tablet 1  . cetirizine (ZYRTEC) 10 MG tablet Take 10 mg by mouth at bedtime as needed for allergies.    . flecainide (TAMBOCOR) 100 MG tablet Take two tablets (200 mg) as directed 60 tablet 6  . Multiple Vitamins-Minerals (MULTIVITAMIN WITH MINERALS) tablet Take 1 tablet by mouth daily.      . pantoprazole (PROTONIX) 40 MG tablet Take 1 tablet (40 mg total) by mouth daily. 30 tablet 0  . NON FORMULARY CPAP-as directed      No current facility-administered medications for this visit.     Past Medical History  Diagnosis Date  . Dyslipidemia   . Concussion 1966    History of 4  . Broken leg 1968  . Anxiety   . Status post ablation of atrial flutter 2008  . Low back pain   . Obstructive sleep apnea on CPAP     Mild  . Atrial fibrillation 2012    Rare occurrences. On ASA & Flecainide prn  . Essential hypertension, benign   . Obesity (BMI 30-39.9)   . Atrial flutter     s/p ablation 2008. On Flecainide prn.  . Right shoulder pain     Intermittent  . Pure hypercholesterolemia     On Lipitor   . PAF (paroxysmal atrial fibrillation)     Rare occurrences. On  ASA & Flecainide prn    ROS:   All systems reviewed and negative except as noted in the HPI.   Past Surgical History  Procedure Laterality Date  . Right knee surgery  1996    ACL, meniscus problems  . Coronary angiography  1997    40% stenosis  . Ablation for atrial flutter  01/2002  . Cardiac catheterization  12/26/01     Family History  Problem Relation Age of Onset  . Heart disease Father 94  . Hypertension Mother 80  . Diabetes Brother 42  . Hypertension Father   . Heart failure Father   . Diabetes Mother   . Hypertension Brother 50     History   Social History  . Marital Status: Married    Spouse Name: N/A  . Number of Children: 2  . Years of Education: N/A   Occupational History  . product manager Other   Social History Main Topics  . Smoking status: Never Smoker   . Smokeless tobacco: Not on file  . Alcohol Use: 4.2 - 6.0 oz/week    7-10 Glasses of wine per week  . Drug Use: No  . Sexual Activity: Not on file   Other Topics Concern  .  Not on file   Social History Narrative   Caffeine: yes, coffee 2-3 cups daily, tea-rare.    Exercise-yes, 2-3X weekly, jog/run.    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    Marital Status: Married.    Children: 2 children-grown                 BP 124/80 mmHg  Pulse 60  Ht 6\' 1"  (1.854 m)  Wt 236 lb (107.049 kg)  BMI 31.14 kg/m2  Physical Exam:  Well appearing 64 year old man, NAD HEENT: Unremarkable Neck:  6 cm JVD, no thyromegally Back:  No CVA tenderness Lungs:  Clear with no wheezes, rales, or rhonchi. HEART:  Regular rate rhythm, no murmurs, no rubs, no clicks Abd:  soft, positive bowel sounds, no organomegally, no rebound, no guarding Ext:  2 plus pulses, no edema, no cyanosis, no clubbing Skin:  No rashes no nodules Neuro:  CN II through XII intact, motor grossly intact  EKG - normal sinus rhythm   Assess/Plan:

## 2014-08-28 NOTE — Assessment & Plan Note (Signed)
His blood pressure is well controlled. No change in meds.  

## 2014-08-28 NOTE — Assessment & Plan Note (Signed)
Today we discussed treatment options. He has inquired about ablation of atrial fib and PPM. I reviewed the indications for both. He will undergo watchful waiting for now. If his atrial fib worsens, then catheter ablation would be a consideration.

## 2014-08-28 NOTE — Assessment & Plan Note (Signed)
His symptoms occurred after coughing and have resolved. He also has acid indigestion. We discussed the symptoms of angina. He will undergo watchful waiting. With strenuous exertion, he has no chest pressure whatsover.

## 2014-08-28 NOTE — Assessment & Plan Note (Signed)
He is wearing his cpap nightly. No change for now. He has followup with Dr. Radford Pax.

## 2014-08-28 NOTE — Patient Instructions (Signed)
Medication Instructions: - no changes - a refill for your flecainide has been sent to [facility] today  Labwork: - none  Procedures/Testing: - none  Follow-Up: - Your physician wants you to follow-up in: 1 year with Dr. Lovena Le. You will receive a reminder letter in the mail two months in advance. If you don't receive a letter, please call our office to schedule the follow-up appointment.  Any Additional Special Instructions Will Be Listed Below (If Applicable). - none

## 2014-10-03 ENCOUNTER — Other Ambulatory Visit: Payer: Self-pay | Admitting: Interventional Cardiology

## 2014-10-07 ENCOUNTER — Encounter: Payer: Self-pay | Admitting: Physician Assistant

## 2014-11-11 ENCOUNTER — Encounter: Payer: Self-pay | Admitting: Physician Assistant

## 2014-12-09 ENCOUNTER — Encounter: Payer: Self-pay | Admitting: Cardiology

## 2014-12-16 ENCOUNTER — Encounter: Payer: Self-pay | Admitting: Physician Assistant

## 2014-12-16 ENCOUNTER — Ambulatory Visit (INDEPENDENT_AMBULATORY_CARE_PROVIDER_SITE_OTHER): Payer: BLUE CROSS/BLUE SHIELD | Admitting: Physician Assistant

## 2014-12-16 VITALS — BP 130/80 | HR 64 | Ht 73.0 in | Wt 236.0 lb

## 2014-12-16 DIAGNOSIS — I4892 Unspecified atrial flutter: Secondary | ICD-10-CM

## 2014-12-16 DIAGNOSIS — R079 Chest pain, unspecified: Secondary | ICD-10-CM

## 2014-12-16 DIAGNOSIS — G4733 Obstructive sleep apnea (adult) (pediatric): Secondary | ICD-10-CM

## 2014-12-16 DIAGNOSIS — Z9989 Dependence on other enabling machines and devices: Secondary | ICD-10-CM

## 2014-12-16 DIAGNOSIS — I10 Essential (primary) hypertension: Secondary | ICD-10-CM

## 2014-12-16 DIAGNOSIS — I480 Paroxysmal atrial fibrillation: Secondary | ICD-10-CM

## 2014-12-16 NOTE — Assessment & Plan Note (Signed)
Patient was in the ER with chest pain in June and felt to be GI related. Training for a half marathon down has no symptoms with running. Call if any further symptoms.

## 2014-12-16 NOTE — Progress Notes (Signed)
Cardiology Office Note   Date:  12/16/2014   ID:  Norrin, Shreffler 11/25/1950, MRN [id number]  PCP:  London Pepper, MD  Cardiologist: Dr. Daneen Schick EPS: Dr. Lovena Le Sleep Apnea: Dr. Radford Pax  Chief Complaint: Atrial fibrillation    History of Present Illness: Carlos Gutierrez is a 64 y.o. male who presents for yearly follow-up. He has history of atrial flutter status post catheter ablation then developed atrial fibrillation 10 years later. He takes flecainide on an as-needed basis. He has episodes of atrial fibrillation 2-3 times a month that last 4-6 hours. He can usually continue his activities during this time. Occasionally he'll have some dizziness and laid down. He did have an emergency room visit 08/25/14 with chest pain that was felt to be coming from acid reflux. He got good relief from a GI cocktail. He was placed on Protonix and took it for month and had no problems.  He occasionally has sharp shooting chest pains that are short lived and yesterday while driving his tractor he had a dull ache that was short-lived. He has no associated dyspnea, palpitations, chest pressure, or presyncope. He is currently training for a half marathon and running 4 miles daily without any symptoms. His father died of an MI at 61 and grandfather also had an MI in his 14s. He just had full blood work including lipids done by his company that he will forward to [place]. Patient had  A stress test approximately 4 years ago that was normal.    Past Medical History  Diagnosis Date  . Dyslipidemia   . Concussion 1966    History of 4  . Broken leg 1968  . Anxiety   . Status post ablation of atrial flutter 2008  . Low back pain   . Obstructive sleep apnea on CPAP     Mild  . Atrial fibrillation ([place]) 2012    Rare occurrences. On ASA & Flecainide prn  . Essential hypertension, benign   . Obesity (BMI 30-39.9)   . Atrial flutter ([place])     s/p ablation 2008. On Flecainide prn.  . Right shoulder pain    Intermittent  . Pure hypercholesterolemia     On Lipitor   . PAF (paroxysmal atrial fibrillation) ([place])     Rare occurrences. On ASA & Flecainide prn    Past Surgical History  Procedure Laterality Date  . Right knee surgery  1996    ACL, meniscus problems  . Coronary angiography  1997    40% stenosis  . Ablation for atrial flutter  01/2002  . Cardiac catheterization  12/26/01     Current Outpatient Prescriptions  Medication Sig Dispense Refill  . aspirin 325 MG tablet Take 325 mg by mouth daily.      [name] atorvastatin (LIPITOR) 20 MG tablet Take 20 mg by mouth daily with supper.     [name] BENICAR HCT 20-12.5 MG per tablet TAKE 1 TABLET DAILY 90 tablet 3  . cetirizine (ZYRTEC) 10 MG tablet Take 10 mg by mouth at bedtime as needed for allergies.    . flecainide (TAMBOCOR) 100 MG tablet Take two tablets (200 mg) as directed 60 tablet 6  . Multiple Vitamins-Minerals (MULTIVITAMIN WITH MINERALS) tablet Take 1 tablet by mouth daily.      . NON FORMULARY CPAP-as directed      No current facility-administered medications for this visit.    Allergies:   Review of patient's allergies indicates no known allergies.    Social History:  The patient  reports that he has never smoked. He does not have any smokeless tobacco history on file. He reports that he drinks about 4.2 - 6.0 oz of alcohol per week. He reports that he does not use illicit drugs.   Family History:  The patient's   family history includes Diabetes in his mother; Diabetes (age of onset: 46) in his brother; Heart attack in his father and paternal grandfather; Heart disease (age of onset: 79) in his father; Heart failure in his father; Hypertension in his father; Hypertension (age of onset: 44) in his brother; Hypertension (age of onset: 32) in his mother. There is no history of Stroke.    ROS:  Please see the history of present illness.   Otherwise, review of systems are positive for none.   All other systems are reviewed and  negative.    PHYSICAL EXAM: VS:  BP 130/80 mmHg  Pulse 64  Ht 6\' 1"  (1.854 m)  Wt 236 lb (107.049 kg)  BMI 31.14 kg/m2 , BMI Body mass index is 31.14 kg/(m^2). GEN: Well nourished, well developed, in no acute distress Neck: no JVD, HJR, carotid bruits, or masses Cardiac:  RRR; no murmurs,gallop, rubs, thrill or heave,  Respiratory:  clear to auscultation bilaterally, normal work of breathing GI: soft, nontender, nondistended, + BS MS: no deformity or atrophy Extremities: without cyanosis, clubbing, edema, good distal pulses bilaterally.  Skin: warm and dry, no rash Neuro:  Strength and sensation are intact    EKG:  EKG is not ordered today. EKG was just done in June when he saw Dr. Lovena Le and was stable   Recent Labs: 08/25/2014: BUN 15; Creatinine, Ser 1.26*; Hemoglobin 15.9; Platelets 184; Potassium 3.9; Sodium 139    Lipid Panel No results found for: CHOL, TRIG, HDL, CHOLHDL, VLDL, LDLCALC, LDLDIRECT    Wt Readings from Last 3 Encounters:  12/16/14 236 lb (107.049 kg)  08/28/14 236 lb (107.049 kg)  08/25/14 233 lb 14.4 oz (106.096 kg)      Other studies Reviewed: Additional studies/ records that were reviewed today include and review of the records demonstrates:  Labs reviewed from his ER visit in June 2016 and were stable.  ASSESSMENT AND PLAN:  Atrial fibrillation Patient has 2-3 episodes a month that last 4-6 hours and go away with when necessary flecainide. He is spoken to Dr. Lovena Le about ablation but wants to continue when necessary flecainide at this time. Follow-up with Dr. Lovena Le in June 2017, Dr. Tamala Julian in 6 months.  Atrial flutter Status post ablation  HTN (hypertension) Blood pressure stable  Chest pain with low risk for cardiac etiology Patient was in the ER with chest pain in June and felt to be GI related. Training for a half marathon down has no symptoms with running. Call if any further symptoms.  Obstructive sleep apnea on CPAP Doing well  on CPap. Follow-up with Dr. Radford Pax.    Sumner Boast, PA-C  12/16/2014 12:54 PM    [facility] [address], [place], [place]  [address] Phone: [phone]; Fax: [phone]

## 2014-12-16 NOTE — Patient Instructions (Signed)
Medication Instructions:   Your physician recommends that you continue on your current medications as directed. Please refer to the Current Medication list given to you today.    Labwork: NONE ORDER TODAY      Testing/Procedures:  NONE ORDER TODAY    Follow-Up:  Your physician wants you to follow-up in:  IN [place] will receive a reminder letter in the mail two months in advance. If you don't receive a letter, please call our office to schedule the follow-up appointment.      Any Other Special Instructions Will Be Listed Below (If Applicable).

## 2014-12-16 NOTE — Assessment & Plan Note (Signed)
Status post ablation. 

## 2014-12-16 NOTE — Assessment & Plan Note (Signed)
Blood pressure stable ? ?

## 2014-12-16 NOTE — Assessment & Plan Note (Signed)
Doing well on CPap. Follow-up with Dr. Radford Pax.

## 2014-12-16 NOTE — Assessment & Plan Note (Signed)
Patient has 2-3 episodes a month that last 4-6 hours and go away with when necessary flecainide. He is spoken to Dr. Lovena Le about ablation but wants to continue when necessary flecainide at this time. Follow-up with Dr. Lovena Le in June 2017, Dr. Tamala Julian in 6 months.

## 2015-01-15 ENCOUNTER — Other Ambulatory Visit: Payer: Self-pay | Admitting: Cardiology

## 2015-01-17 ENCOUNTER — Telehealth: Payer: Self-pay | Admitting: Cardiology

## 2015-01-17 NOTE — Telephone Encounter (Signed)
Pt calling again requesting a refill on Eszopiclone (Lunesta)  3 mg take 1 tablet as needed at bedtime. Medication was D/C on 04/13/2013. I explained to the pt that the medication was no longer on his med list. Pt wanted to know if Dr. Radford Pax would prescribe this medication again. Please advise

## 2015-01-22 MED ORDER — ESZOPICLONE 3 MG PO TABS: 3.0000 mg | ORAL_TABLET | Freq: Every day | ORAL | Status: DC | PRN

## 2015-01-22 MED ORDER — ESZOPICLONE 3 MG PO TABS: 3.0000 mg | ORAL_TABLET | Freq: Every day | ORAL | Status: DC

## 2015-01-22 NOTE — Telephone Encounter (Signed)
Pt's medication for Eszopiclone 3 mg tablets was phone in to [facility], spoke with Maudie Mercury, Pharmacist and gave a verbal order to dispense 15 tablets, with 2 refills, taking 1 tablet by mouth at bedtime as needed, take immediately before bedtime, Per Dr. Theodosia Blender phone note on 01/17/15. Called pt to inform him as well. I advised the pt that if he has any other problems, questions or concerns to call the office. Pt verbalized understanding.

## 2015-01-22 NOTE — Telephone Encounter (Signed)
OK to refill with #15 tablets with 2 refills

## 2015-04-07 ENCOUNTER — Other Ambulatory Visit: Payer: Self-pay | Admitting: Surgery

## 2015-04-07 DIAGNOSIS — K429 Umbilical hernia without obstruction or gangrene: Secondary | ICD-10-CM | POA: Insufficient documentation

## 2015-04-07 NOTE — H&P (Signed)
Carlos Gutierrez. Carlos Gutierrez 04/07/2015 8:31 AM Location: [facility] Patient #: [id number] DOB: 06-23-1950 Married / Language: English / Race: White Male  History of Present Illness Carlos Hector MD; 04/07/2015 9:31 AM) The patient is a 65 year old male who presents with an umbilical hernia. Note for "Umbilical hernia": Patient sent by Dr. London Pepper for surgical consultation for concern of umbilical hernia.  65 year old male. History of atrial fibrillation controlled with antiarrhythmics. On aspirin only. Found to have umbilical hernia on annual exam. Surgical consultation requested. Patient noticed a lump a few months ago. Wife concerned as well. It is not terribly bothersome but he feels it often. Is has not gone larger. However he is concerned given his travel schedule and in physical activity that it will get worse. He like to have repaired while it is small and before he starts traveling again. He runs rather regularly. He's never abdominal surgeries. No history of skin infections. No history of fall or trauma. Movement a few times a day. Normal colonoscopy 10 years ago. Last seen by [facility] cardiology in the fall with pretty encouraging evaluation.   Other Problems Carlos Gutierrez, CMA; 04/07/2015 8:32 AM) Atrial Fibrillation High blood pressure Sleep Apnea Umbilical Hernia Repair  Past Surgical History Carlos Gutierrez, CMA; 04/07/2015 8:32 AM) Colon Polyp Removal - Colonoscopy Knee Surgery Right. Oral Surgery  Diagnostic Studies History Carlos Gutierrez, [place]; 04/07/2015 8:32 AM) Colonoscopy 5-10 years ago  Allergies Carlos Gutierrez, CMA; 04/07/2015 8:32 AM) No Known Drug Allergies 04/07/2015  Medication History Carlos Gutierrez, CMA; 04/07/2015 8:35 AM) Flecainide Acetate (100MG  Tablet, Oral) Active. Benicar HCT (20-12.5MG  Tablet, Oral) Active. Atorvastatin Calcium (20MG  Tablet, Oral) Active. Lunesta (3MG  Tablet, Oral) Active. Pantoprazole  Sodium (40MG  Tablet DR, Oral) Active. Claritin (10MG  Tablet, Oral) Active. Aspirin (81MG  Tablet, Oral) Active. Medications Reconciled  Social History Carlos Gutierrez, [place]; 04/07/2015 8:32 AM) Alcohol use Moderate alcohol use. Caffeine use Coffee, Tea. No drug use Tobacco use Never smoker.  Family History Carlos Gutierrez, [place]; 04/07/2015 8:32 AM) Alcohol Abuse Brother. Arthritis Mother. Colon Cancer Family Members In General. Diabetes Mellitus Brother, Father. Heart Disease Family Members In General, Father. Heart disease in male family member before age 47 Hypertension Brother, Father, Mother. Respiratory Condition Mother.     Review of Systems Carlos Gutierrez CMA; 04/07/2015 8:32 AM) General Not Present- Appetite Loss, Chills, Fatigue, Fever, Night Sweats, Weight Gain and Weight Loss. Skin Not Present- Change in Wart/Mole, Dryness, Hives, Jaundice, New Lesions, Non-Healing Wounds, Rash and Ulcer. HEENT Present- Wears glasses/contact lenses. Not Present- Earache, Hearing Loss, Hoarseness, Nose Bleed, Oral Ulcers, Ringing in the Ears, Seasonal Allergies, Sinus Pain, Sore Throat, Visual Disturbances and Yellow Eyes. Respiratory Not Present- Bloody sputum, Chronic Cough, Difficulty Breathing, Snoring and Wheezing. Breast Not Present- Breast Mass, Breast Pain, Nipple Discharge and Skin Changes. Cardiovascular Not Present- Chest Pain, Difficulty Breathing Lying Down, Leg Cramps, Palpitations, Rapid Heart Rate, Shortness of Breath and Swelling of Extremities. Gastrointestinal Not Present- Abdominal Pain, Bloating, Bloody Stool, Change in Bowel Habits, Chronic diarrhea, Constipation, Difficulty Swallowing, Excessive gas, Gets full quickly at meals, Hemorrhoids, Indigestion, Nausea, Rectal Pain and Vomiting. Male Genitourinary Not Present- Blood in Urine, Change in Urinary Stream, Frequency, Impotence, Nocturia, Painful Urination, Urgency and Urine Leakage. Musculoskeletal Not Present-  Back Pain, Joint Pain, Joint Stiffness, Muscle Pain, Muscle Weakness and Swelling of Extremities. Neurological Not Present- Decreased Memory, Fainting, Headaches, Numbness, Seizures, Tingling, Tremor, Trouble walking and Weakness. Psychiatric Not Present- Anxiety, Bipolar, Change in Sleep Pattern, Depression,  Fearful and Frequent crying. Endocrine Not Present- Cold Intolerance, Excessive Hunger, Hair Changes, Heat Intolerance, Hot flashes and New Diabetes. Hematology Not Present- Easy Bruising, Excessive bleeding, Gland problems, HIV and Persistent Infections.  Vitals Carlos Gutierrez CMA; 04/07/2015 8:36 AM) 04/07/2015 8:35 AM Weight: 230 lb Height: 73in Body Surface Area: 2.28 m Body Mass Index: 30.34 kg/m  Temp.: 98.39F(Temporal)  Pulse: 64 (Regular)  BP: 130/70 (Sitting, Left Arm, Standard)      Physical Exam Carlos Hector MD; 04/07/2015 9:31 AM)  General Mental Status-Alert. General Appearance-Not in acute distress, Not Sickly. Orientation-Oriented X3. Hydration-Well hydrated. Voice-Normal.  Integumentary Global Assessment Upon inspection and palpation of skin surfaces of the - Axillae: non-tender, no inflammation or ulceration, no drainage. and Distribution of scalp and body hair is normal. General Characteristics Temperature - normal warmth is noted.  Head and Neck Head-normocephalic, atraumatic with no lesions or palpable masses. Face Global Assessment - atraumatic, no absence of expression. Neck Global Assessment - no abnormal movements, no bruit auscultated on the right, no bruit auscultated on the left, no decreased range of motion, non-tender. Trachea-midline. Thyroid Gland Characteristics - non-tender.  Eye Eyeball - Left-Extraocular movements intact, No Nystagmus. Eyeball - Right-Extraocular movements intact, No Nystagmus. Cornea - Left-No Hazy. Cornea - Right-No Hazy. Sclera/Conjunctiva - Left-No scleral icterus, No  Discharge. Sclera/Conjunctiva - Right-No scleral icterus, No Discharge. Pupil - Left-Direct reaction to light normal. Pupil - Right-Direct reaction to light normal.  ENMT Ears Pinna - Left - no drainage observed, no generalized tenderness observed. Right - no drainage observed, no generalized tenderness observed. Nose and Sinuses External Inspection of the Nose - no destructive lesion observed. Inspection of the nares - Left - quiet respiration. Right - quiet respiration. Mouth and Throat Lips - Upper Lip - no fissures observed, no pallor noted. Lower Lip - no fissures observed, no pallor noted. Nasopharynx - no discharge present. Oral Cavity/Oropharynx - Tongue - no dryness observed. Oral Mucosa - no cyanosis observed. Hypopharynx - no evidence of airway distress observed.  Chest and Lung Exam Inspection Movements - Normal and Symmetrical. Accessory muscles - No use of accessory muscles in breathing. Palpation Palpation of the chest reveals - Non-tender. Auscultation Breath sounds - Normal and Clear.  Cardiovascular Auscultation Rhythm - Regular. Murmurs & Other Heart Sounds - Auscultation of the heart reveals - No Murmurs and No Systolic Clicks.  Abdomen Inspection Inspection of the abdomen reveals - No Visible peristalsis and No Abnormal pulsations. Umbilicus - No Bleeding, No Urine drainage. Palpation/Percussion Palpation and Percussion of the abdomen reveal - Soft, Non Tender, No Rebound tenderness, No Rigidity (guarding) and No Cutaneous hyperesthesia. Note: Moderate supra umbilical diastases recti. 2 x 1.5 cm hernia in the upper end of the umbilical stalk. Soft and reducible.  Male Genitourinary Sexual Maturity Tanner 5 - Adult hair pattern and Adult penile size and shape. Note: Normal external genitalia. Epididymi, testes, and spermatic cords normal without any masses. No inguinal hernias.  Peripheral Vascular Upper Extremity Inspection - Left - No Cyanotic  nailbeds, Not Ischemic. Right - No Cyanotic nailbeds, Not Ischemic.  Neurologic Neurologic evaluation reveals -normal attention span and ability to concentrate, able to name objects and repeat phrases. Appropriate fund of knowledge , normal sensation and normal coordination. Mental Status Affect - not angry, not paranoid. Cranial Nerves-Normal Bilaterally. Gait-Normal.  Neuropsychiatric Mental status exam performed with findings of-able to articulate well with normal speech/language, rate, volume and coherence, thought content normal with ability to perform basic computations and apply abstract  reasoning and no evidence of hallucinations, delusions, obsessions or homicidal/suicidal ideation.  Musculoskeletal Global Assessment Spine, Ribs and Pelvis - no instability, subluxation or laxity. Right Upper Extremity - no instability, subluxation or laxity.  Lymphatic Head & Neck  General Head & Neck Lymphatics: Bilateral - Description - No Localized lymphadenopathy. Axillary  General Axillary Region: Bilateral - Description - No Localized lymphadenopathy. Femoral & Inguinal  Generalized Femoral & Inguinal Lymphatics: Left - Description - No Localized lymphadenopathy. Right - Description - No Localized lymphadenopathy.    Assessment & Plan Carlos Hector MD; 04/07/2015 9:27 AM)  DIASTASIS RECTI (M62.08) Impression: Moderate sized but not symptomatic.  UMBILICAL HERNIA WITHOUT OBSTRUCTION AND WITHOUT GANGRENE (K42.9) Impression: Small but symptomatic umbilical hernia and a very active large male. He wishes to be aggressive and have it repaired. He travels a lot and also is very active. Given his active state, odds are it will become more bothersome in the future.  Risks of surgery to be minimal if we do this while it is small and he is active. Given the presence of diastases recti in his size, think a mesh underlay repair was performed more durable repair. He is interested in  that.  There are issues potentially of going back into atrial fibrillation so cardiac clearance would be a good idea. Hopefully just a letter since he was seen in October. He wishes to be aggressive and proceed prior to doing his 10K on April 1 and traveling to [place] in May. We will try to fit him in soon  [place] [id number]) Impression: Patient interested in surgical repair. Laparoscopic under repair with mesh given diastases, intense physical activity, above-average size.  Current Plans You are being scheduled for surgery - Our schedulers will call you.  You should hear from our office's scheduling department within 5 working days about the location, date, and time of surgery. We try to make accommodations for patient's preferences in scheduling surgery, but sometimes the OR schedule or the surgeon's schedule prevents [place] from making those accommodations.  If you have not heard from our office [phone]) in 5 working days, call the office and ask for your surgeon's nurse.  If you have other questions about your diagnosis, plan, or surgery, call the office and ask for your surgeon's nurse.  Written instructions provided Pt Education - Pamphlet Given - Laparoscopic Hernia Repair: discussed with patient and provided information. The anatomy & physiology of the abdominal wall was discussed. The pathophysiology of hernias was discussed. Natural history risks without surgery including progeressive enlargement, pain, incarceration, & strangulation was discussed. Contributors to complications such as smoking, obesity, diabetes, prior surgery, etc were discussed.  I feel the risks of no intervention will lead to serious problems that outweigh the operative risks; therefore, I recommended surgery to reduce and repair the hernia. I explained laparoscopic techniques with possible need for an open approach. I noted the probable use of mesh to patch and/or buttress the  hernia repair  Risks such as bleeding, infection, abscess, need for further treatment, heart attack, death, and other risks were discussed. I noted a good likelihood this will help address the problem. Goals of post-operative recovery were discussed as well. Possibility that this will not correct all symptoms was explained. I stressed the importance of low-impact activity, aggressive pain control, avoiding constipation, & not pushing through pain to minimize risk of post-operative chronic pain or injury. Possibility of reherniation especially with smoking, obesity, diabetes, immunosuppression, and other health conditions  was discussed. We will work to minimize complications.  An educational handout further explaining the pathology & treatment options was given as well. Questions were answered. The patient expresses understanding & wishes to proceed with surgery.  Active male with paroxysmal atrial flutter status post ablation. Still's gets intermittent episodes control with flecainide. Would like cardiac clearance to make sure to minimize his perioperative risks  I recommended obtaining preoperative cardiac clearance. I am concerned about the health of the patient and the ability to tolerate the operation. Therefore, we will request clearance by cardiology to better assess operative risk & see if a reevaluation, further workup, etc is needed. Also recommendations on how medications such as for anticoagulation and blood pressure should be managed/held/restarted after surgery.

## 2015-04-16 HISTORY — PX: UMBILICAL HERNIA REPAIR: SHX196

## 2015-04-24 ENCOUNTER — Telehealth: Payer: Self-pay

## 2015-04-24 NOTE — Telephone Encounter (Signed)
Requesting surgical clearance:   1. Type of surgery: laparoscopic ventral wall hernia repair  2. Surgeon: Dr. Johney Maine  3. Surgical date: pending clearance - wanting to do as soon as possible  4. Medications that need to be held: none  5. CAD: No     6. I will defer to: [facility] Attn: Illene Regulus, [place] Fax [phone] phone

## 2015-04-24 NOTE — Telephone Encounter (Signed)
Cleared for upcoming Lap Ventral Hernia repair. Has chronic atrial flutter with moderate rate control. Stop coumadin 5 days prior to procedure.

## 2015-04-24 NOTE — Telephone Encounter (Signed)
Pt clearance paperwork faxed to [facility].

## 2015-05-01 ENCOUNTER — Telehealth: Payer: Self-pay

## 2015-05-01 NOTE — Telephone Encounter (Signed)
Cardiac clearance placed in MR nurse fax box to be faxed to [place]

## 2015-05-19 ENCOUNTER — Encounter: Payer: Self-pay | Admitting: Internal Medicine

## 2015-10-16 ENCOUNTER — Other Ambulatory Visit: Payer: Self-pay | Admitting: Interventional Cardiology

## 2016-01-05 ENCOUNTER — Other Ambulatory Visit: Payer: Self-pay | Admitting: Interventional Cardiology

## 2016-01-06 ENCOUNTER — Other Ambulatory Visit: Payer: Self-pay

## 2016-01-06 MED ORDER — OLMESARTAN MEDOXOMIL-HCTZ 20-12.5 MG PO TABS: 1.0000 | ORAL_TABLET | Freq: Every day | ORAL | 0 refills | Status: DC

## 2016-02-03 DIAGNOSIS — Z1211 Encounter for screening for malignant neoplasm of colon: Secondary | ICD-10-CM | POA: Diagnosis not present

## 2016-02-03 DIAGNOSIS — Z800 Family history of malignant neoplasm of digestive organs: Secondary | ICD-10-CM | POA: Diagnosis not present

## 2016-02-25 ENCOUNTER — Encounter: Payer: Self-pay | Admitting: Physician Assistant

## 2016-02-25 ENCOUNTER — Ambulatory Visit (INDEPENDENT_AMBULATORY_CARE_PROVIDER_SITE_OTHER): Payer: BLUE CROSS/BLUE SHIELD | Admitting: Physician Assistant

## 2016-02-25 ENCOUNTER — Other Ambulatory Visit: Payer: Self-pay | Admitting: Gastroenterology

## 2016-02-25 ENCOUNTER — Telehealth: Payer: Self-pay | Admitting: Physician Assistant

## 2016-02-25 VITALS — BP 130/80 | HR 62 | Ht 73.0 in | Wt 238.4 lb

## 2016-02-25 DIAGNOSIS — Z9989 Dependence on other enabling machines and devices: Secondary | ICD-10-CM

## 2016-02-25 DIAGNOSIS — I10 Essential (primary) hypertension: Secondary | ICD-10-CM | POA: Diagnosis not present

## 2016-02-25 DIAGNOSIS — E7800 Pure hypercholesterolemia, unspecified: Secondary | ICD-10-CM

## 2016-02-25 DIAGNOSIS — G4733 Obstructive sleep apnea (adult) (pediatric): Secondary | ICD-10-CM | POA: Diagnosis not present

## 2016-02-25 DIAGNOSIS — I480 Paroxysmal atrial fibrillation: Secondary | ICD-10-CM

## 2016-02-25 MED ORDER — FLECAINIDE ACETATE 100 MG PO TABS: ORAL_TABLET | ORAL | 6 refills | Status: DC

## 2016-02-25 NOTE — Patient Instructions (Addendum)
Medication Instructions:  A REFILL WAS SENT IN FOR FLECAINIDE 100 MG TABLET WITH THE DIRECTIONS TO TAKE 2 TABS AS DIRECTED  Labwork: NONE  Testing/Procedures: NONE  Follow-Up: DR. Lovena Le 6 MONTHS ; OUR OFFICE WILL SEND OUT A REMINDER LETTER A COUPLE OF MONTHS EARLIER TO HAVE YOU CALL AND MAKE AN APPT WITH DR. Lovena Le.   Any Other Special Instructions Will Be Listed Below (If Applicable).  If you need a refill on your cardiac medications before your next appointment, please call your pharmacy.

## 2016-02-25 NOTE — Progress Notes (Signed)
Why does he need to see Lovena Le in 6 months if not interested in ablation? He jus needs general cardiology f/u.

## 2016-02-25 NOTE — Telephone Encounter (Signed)
Please tell Christen Butter that I spoke with Dr. Daneen Schick and he wants Mr. Azeez to see him in 6 mos for routine Cardiology follow up. Please change 6 mos follow up to be with Dr. Daneen Schick. Thanks, Richardson Dopp, PA-C   02/25/2016 7:57 PM

## 2016-02-25 NOTE — Progress Notes (Signed)
Cardiology Office Note:    Date:  02/25/2016   ID:  Carlos, Gutierrez 1950-08-28, MRN [id number]  PCP:  London Pepper, MD  Cardiologist:  Dr. Daneen Schick   Electrophysiologist:  Dr. Cristopher Peru  Sleep: Dr. Fransico Him   Referring MD: London Pepper, MD   Chief Complaint  Patient presents with  . Follow-up    AFib    History of Present Illness:    Carlos Gutierrez is a 65 y.o. male with a hx of AFlutter s/p RFCA, paroxysmal AF, HL, HTN, OSA.  He takes Flecainide prn for recurrent AF ("Pill in pocket").  Last seen by Dr. Daneen Schick in 2015.  Last seen by Dr. Cristopher Peru in 6/16.  The plan at that time was to consider PVI ablation if AF worsened.  Last seen here by Ermalinda Barrios, PA-C in 10/16.    He returns for Cardiology follow up.  He is well known to me as we attend the same church ([employer] in [place]).  He has generally been doing well since last seen. 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  He still exercises quite a bit and does calisthenics without chest pain.  He has mild dyspnea on exertion with more extreme activities.  He denies orthopnea, PND.  He sleeps with CPAP.  He has mild L ankle edema that is chronic and unchanged.  He notes an increased amount of AFib episodes over the past 2 months.  He has been under more stress with frequent travel.  He takes Flecainide with resolution of his symptoms in ~ 2-3 hours.     Prior CV studies that were reviewed today include:    Echo 9/12 Mild LAE, borderline conc LVH, EF 60-65, mild aortic root dilatation, normal diastolic function  Past Medical History:  Diagnosis Date  . Anxiety   . Atrial fibrillation ([place]) 2012   Rare occurrences. On ASA & Flecainide prn  . Atrial flutter ([place])    s/p ablation 2008. On Flecainide prn.  . Broken leg 1968  . Concussion 1966   History of 4  . Dyslipidemia   . Essential hypertension, benign    . Low back pain   . Obesity (BMI 30-39.9)   . Obstructive sleep apnea on CPAP    Mild  . PAF (paroxysmal atrial fibrillation) ([place])    Rare occurrences. On ASA & Flecainide prn  . Pure hypercholesterolemia    On Lipitor   . Right shoulder pain    Intermittent  . Status post ablation of atrial flutter 2008    Past Surgical History:  Procedure Laterality Date  . ablation for atrial flutter  01/2002  . CARDIAC CATHETERIZATION  12/26/01  . coronary angiography  1997   40% stenosis  . right knee surgery  1996   ACL, meniscus problems    Current Medications: Current Meds  Medication Sig  . aspirin 325 MG tablet Take 325 mg by mouth daily.    [name] atorvastatin (LIPITOR) 20 MG tablet Take 20 mg by mouth daily with supper.   . cetirizine (ZYRTEC) 10 MG tablet Take 10 mg by mouth at bedtime as needed for allergies.  . Eszopiclone 3 MG TABS Take 3 mg by mouth 3 times/day as needed-between meals & bedtime (sleep). Take immediately before bedtime  . Multiple Vitamins-Minerals (MULTIVITAMIN WITH MINERALS) tablet Take 1 tablet by mouth daily.    . NON FORMULARY  CPAP-as directed   . olmesartan-hydrochlorothiazide (BENICAR HCT) 20-12.5 MG tablet Take 1 tablet by mouth daily.  . [DISCONTINUED] flecainide (TAMBOCOR) 100 MG tablet Take two tablets (200 mg) as directed     Allergies:   Patient has no known allergies.   Social History   Social History  . Marital status: Married    Spouse name: N/A  . Number of children: 2  . Years of education: N/A   Occupational History  . product manager Other   Social History Main Topics  . Smoking status: Never Smoker  . Smokeless tobacco: None  . Alcohol use 4.2 - 6.0 oz/week    7 - 10 Glasses of wine per week  . Drug use: No  . Sexual activity: Not Asked   Other Topics Concern  . None   Social History Narrative   Caffeine: yes, coffee 2-3 cups daily, tea-rare.    Exercise-yes, 2-3X weekly, jog/run.    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    Marital Status: Married.    Children: 2 children-grown                 Family History:  The patient's family history includes Diabetes in his mother; Diabetes (age of onset: 87) in his brother; Heart attack in his father and paternal grandfather; Heart disease (age of onset: 16) in his father; Heart failure in his father; Hypertension in his father; Hypertension (age of onset: 62) in his brother; Hypertension (age of onset: 37) in his mother.   ROS:   Please see the history of present illness.    ROS All other systems reviewed and are negative.   EKGs/Labs/Other Test Reviewed:    EKG:  EKG is  ordered today.  The ekg ordered today demonstrates NSR, HR 60, normal axis, QTc 434 ms, no change from prior tracing.  Recent Labs: No results found for requested labs within last 8760 hours.   Recent Lipid Panel No results found for: CHOL, TRIG, HDL, CHOLHDL, VLDL, LDLCALC, LDLDIRECT   Physical Exam:    VS:  BP 130/80   Pulse 62   Ht 6\' 1"  (1.854 m)   Wt 238 lb 6.4 oz (108.1 kg)   BMI 31.45 kg/m     Wt Readings from Last 3 Encounters:  02/25/16 238 lb 6.4 oz (108.1 kg)  12/16/14 236 lb (107 kg)  08/28/14 236 lb (107 kg)     Physical Exam  Constitutional: He is oriented to person, place, and time. He appears well-developed and well-nourished. No distress.  HENT:  Head: Normocephalic and atraumatic.  Eyes: No scleral icterus.  Neck: No JVD present. Carotid bruit is not present.  Cardiovascular: Normal rate, regular rhythm and normal heart sounds.   No murmur heard. Pulmonary/Chest: Effort normal and breath sounds normal. He has no wheezes. He has no rales.  Abdominal: Soft. There is no tenderness.  Musculoskeletal: He exhibits no edema.  Neurological: He is alert and oriented to person, place, and time.  Skin: Skin is warm and dry.  Psychiatric: He has a normal mood and affect.    ASSESSMENT:      1. Essential hypertension   2. Paroxysmal atrial fibrillation (HCC)   3. Pure hypercholesterolemia   4. Obstructive sleep apnea on CPAP    PLAN:    In order of problems listed above:  1. Paroxysmal AF - He has discussed PVI ablation with Dr. Cristopher Peru in the past.  He has  had more episodes over the past 2 months, but overall does not feel that his AFib has increased in frequency.  He is in NSR today.  He is not yet interested in proceeding with PVI ablation but knows that if his symptoms continue to increase, this would be a good option for him.  CHADS2-VASc=1 (HTN).  Therefore, he continues on ASA only.  Continue Flecainide prn.    2. HTN - BP controlled.  3. HL - LDL in 12/16 was 59.  Continue statin.  Managed by PCP.  4. OSA - Continue CPAP.    Medication Adjustments/Labs and Tests Ordered: Current medicines are reviewed at length with the patient today.  Concerns regarding medicines are outlined above.  Medication changes, Labs and Tests ordered today are outlined in the Patient Instructions noted below. Patient Instructions  Medication Instructions:  A REFILL WAS SENT IN FOR FLECAINIDE 100 MG TABLET WITH THE DIRECTIONS TO TAKE 2 TABS AS DIRECTED  Labwork: NONE  Testing/Procedures: NONE  Follow-Up: DR. Lovena Le 6 MONTHS ; OUR OFFICE WILL SEND OUT A REMINDER LETTER A COUPLE OF MONTHS EARLIER TO HAVE YOU CALL AND MAKE AN APPT WITH DR. Lovena Le.   Any Other Special Instructions Will Be Listed Below (If Applicable).  If you need a refill on your cardiac medications before your next appointment, please call your pharmacy.  Signed, Richardson Dopp, PA-C  02/25/2016 2:37 PM    [address], [place], [place]  [address] Phone: [phone]; Fax: [phone]

## 2016-03-02 ENCOUNTER — Encounter (HOSPITAL_COMMUNITY): Payer: Self-pay

## 2016-03-02 NOTE — Telephone Encounter (Signed)
Lmom per Dr. Tamala Julian and Brynda Rim. PA that Dr. Tamala Julian will f/u pt in 6 months. I asked for ptcb to let me know that he did get my message.

## 2016-03-03 ENCOUNTER — Encounter (HOSPITAL_COMMUNITY): Payer: Self-pay

## 2016-03-03 MED ORDER — OLMESARTAN MEDOXOMIL-HCTZ 20-12.5 MG PO TABS: 1.0000 | ORAL_TABLET | Freq: Every day | ORAL | 3 refills | Status: DC

## 2016-03-03 NOTE — Addendum Note (Signed)
Addended by: Michae Kava on: 03/03/2016 02:25 PM   Modules accepted: Orders

## 2016-03-03 NOTE — Telephone Encounter (Signed)
Pt gave ok to s/w his wife. Pt's wife aware that Dr. Tamala Julian said he will f/u with the pt in 6 months. Wife also said pt needs a refill on his Benicar/HCTZ sent to [facility].

## 2016-03-11 NOTE — H&P (Signed)
Carlos Gutierrez HPI: This 42 year white old male presents to the office for colorectal cancer screening. He has 3-4 BM's per day with no obvious blood or mucus in the stool. He had one episode of BRBPR in May after a bout of constipation. He has good appetite and his weight has been stable. He denies having any complaints of abdominal pain, nausea, vomiting, acid reflux, dysphagia or odynophagia. He denies having a family history of celiac sprue or IBD. His maternal grandmother was diagnosed with colon cancer in her 59's. He had a normal colonoscopy done 10 years ago but he does not recall the name of the gastroenterologist who did his procedure.   Past Medical History:  Diagnosis Date  . Atrial fibrillation ([place]) 2012   Rare occurrences. On ASA & Flecainide prn  . Atrial flutter ([place])    s/p ablation 2008. On Flecainide prn.  . Broken leg 1968  . Concussion 1966   History of 4  . Dyslipidemia   . Essential hypertension, benign   . Low back pain   . Obesity (BMI 30-39.9)   . Obstructive sleep apnea on CPAP    Mild  . PAF (paroxysmal atrial fibrillation) ([place])    Rare occurrences. On ASA & Flecainide prn  . Pure hypercholesterolemia    On Lipitor   . Right shoulder pain    Intermittent  . Status post ablation of atrial flutter 2008    Past Surgical History:  Procedure Laterality Date  . ablation for atrial flutter  01/2002  . CARDIAC CATHETERIZATION  12/26/01  . coronary angiography  1997   40% stenosis  . right knee surgery  1996   ACL, meniscus problems  . UMBILICAL HERNIA REPAIR  04/2015    Family History  Problem Relation Age of Onset  . Hypertension Mother 14  . Diabetes Mother   . Heart disease Father 65  . Hypertension Father   . Heart failure Father   . Heart attack Father   . Diabetes Brother 32  . Hypertension Brother 79  . Heart attack Paternal Grandfather   . Stroke Neg Hx     Social History:  reports that he has never smoked. He has never used  smokeless tobacco. He reports that he drinks about 4.2 - 6.0 oz of alcohol per week . He reports that he does not use drugs.  Allergies: No Known Allergies  Medications: Scheduled: Continuous:  No results found for this or any previous visit (from the past 24 hour(s)).   No results found.  ROS:  As stated above in the HPI otherwise negative.  There were no vitals taken for this visit.    PE: Gen: NAD, Alert and Oriented HEENT:  [place]/AT, EOMI Neck: Supple, no LAD Lungs: CTA Bilaterally CV: RRR without M/G/R ABM: Soft, NTND, +BS Ext: No C/C/E  Assessment/Plan: 1) Screening colonoscopy.  Reymundo Winship D 03/11/2016, 3:05 PM

## 2016-03-12 ENCOUNTER — Ambulatory Visit (HOSPITAL_COMMUNITY): Payer: BLUE CROSS/BLUE SHIELD | Admitting: Anesthesiology

## 2016-03-12 ENCOUNTER — Encounter (HOSPITAL_COMMUNITY): Payer: Self-pay

## 2016-03-12 ENCOUNTER — Ambulatory Visit (HOSPITAL_COMMUNITY)
Admission: RE | Admit: 2016-03-12 | Discharge: 2016-03-12 | Disposition: A | Discharge status: Home / Self Care | Payer: BLUE CROSS/BLUE SHIELD | Source: Ambulatory Visit | Attending: Gastroenterology | Admitting: Gastroenterology

## 2016-03-12 ENCOUNTER — Encounter (HOSPITAL_COMMUNITY)
Admission: RE | Disposition: A | Discharge status: Home / Self Care | Payer: Self-pay | Source: Ambulatory Visit | Attending: Gastroenterology

## 2016-03-12 DIAGNOSIS — E669 Obesity, unspecified: Secondary | ICD-10-CM | POA: Insufficient documentation

## 2016-03-12 DIAGNOSIS — Z6831 Body mass index (BMI) 31.0-31.9, adult: Secondary | ICD-10-CM | POA: Insufficient documentation

## 2016-03-12 DIAGNOSIS — G4733 Obstructive sleep apnea (adult) (pediatric): Secondary | ICD-10-CM | POA: Insufficient documentation

## 2016-03-12 DIAGNOSIS — Z7982 Long term (current) use of aspirin: Secondary | ICD-10-CM | POA: Diagnosis not present

## 2016-03-12 DIAGNOSIS — Z800 Family history of malignant neoplasm of digestive organs: Secondary | ICD-10-CM | POA: Diagnosis not present

## 2016-03-12 DIAGNOSIS — E785 Hyperlipidemia, unspecified: Secondary | ICD-10-CM | POA: Diagnosis not present

## 2016-03-12 DIAGNOSIS — E7800 Pure hypercholesterolemia, unspecified: Secondary | ICD-10-CM | POA: Insufficient documentation

## 2016-03-12 DIAGNOSIS — I480 Paroxysmal atrial fibrillation: Secondary | ICD-10-CM | POA: Diagnosis not present

## 2016-03-12 DIAGNOSIS — K5730 Diverticulosis of large intestine without perforation or abscess without bleeding: Secondary | ICD-10-CM | POA: Diagnosis not present

## 2016-03-12 DIAGNOSIS — D123 Benign neoplasm of transverse colon: Secondary | ICD-10-CM | POA: Insufficient documentation

## 2016-03-12 DIAGNOSIS — I10 Essential (primary) hypertension: Secondary | ICD-10-CM | POA: Diagnosis not present

## 2016-03-12 DIAGNOSIS — Z1211 Encounter for screening for malignant neoplasm of colon: Secondary | ICD-10-CM | POA: Insufficient documentation

## 2016-03-12 DIAGNOSIS — I4891 Unspecified atrial fibrillation: Secondary | ICD-10-CM | POA: Diagnosis not present

## 2016-03-12 HISTORY — PX: COLONOSCOPY WITH PROPOFOL: SHX5780

## 2016-03-12 SURGERY — COLONOSCOPY WITH PROPOFOL
Anesthesia: Monitor Anesthesia Care

## 2016-03-12 MED ORDER — ONDANSETRON HCL 4 MG/2ML IJ SOLN
INTRAMUSCULAR | Status: AC
  Filled 2016-03-12: qty 2

## 2016-03-12 MED ORDER — SODIUM CHLORIDE 0.9 % IV SOLN: INTRAVENOUS | Status: DC

## 2016-03-12 MED ORDER — PROPOFOL 10 MG/ML IV BOLUS
INTRAVENOUS | Status: DC | PRN
  Administered 2016-03-12: 40 mg via INTRAVENOUS

## 2016-03-12 MED ORDER — PROPOFOL 500 MG/50ML IV EMUL
INTRAVENOUS | Status: DC | PRN
  Administered 2016-03-12: 150 ug/kg/min via INTRAVENOUS

## 2016-03-12 MED ORDER — LIDOCAINE 2% (20 MG/ML) 5 ML SYRINGE
INTRAMUSCULAR | Status: DC | PRN
  Administered 2016-03-12: 40 mg via INTRAVENOUS

## 2016-03-12 MED ORDER — LIDOCAINE 2% (20 MG/ML) 5 ML SYRINGE
INTRAMUSCULAR | Status: AC
  Filled 2016-03-12: qty 5

## 2016-03-12 MED ORDER — ONDANSETRON HCL 4 MG/2ML IJ SOLN
INTRAMUSCULAR | Status: DC | PRN
  Administered 2016-03-12: 4 mg via INTRAVENOUS

## 2016-03-12 MED ORDER — LACTATED RINGERS IV SOLN
INTRAVENOUS | Status: DC
  Administered 2016-03-12: 13:00:00 via INTRAVENOUS

## 2016-03-12 SURGICAL SUPPLY — 22 items

## 2016-03-12 NOTE — Anesthesia Preprocedure Evaluation (Signed)
Anesthesia Evaluation  Patient identified by MRN, date of birth, ID band Patient awake    Reviewed: Allergy & Precautions, NPO status , Patient's Chart, lab work & pertinent test results  Airway Mallampati: I  TM Distance: >3 FB Neck ROM: Full    Dental   Pulmonary sleep apnea ,    Pulmonary exam normal        Cardiovascular hypertension, Pt. on medications Normal cardiovascular exam+ dysrhythmias Atrial Fibrillation      Neuro/Psych    GI/Hepatic   Endo/Other    Renal/GU      Musculoskeletal   Abdominal   Peds  Hematology   Anesthesia Other Findings   Reproductive/Obstetrics                             Anesthesia Physical Anesthesia Plan  ASA: III  Anesthesia Plan: MAC   Post-op Pain Management:    Induction: Intravenous  Airway Management Planned: Simple Face Mask  Additional Equipment:   Intra-op Plan:   Post-operative Plan:   Informed Consent: I have reviewed the patients History and Physical, chart, labs and discussed the procedure including the risks, benefits and alternatives for the proposed anesthesia with the patient or authorized representative who has indicated his/her understanding and acceptance.     Plan Discussed with: CRNA and Surgeon  Anesthesia Plan Comments:         Anesthesia Quick Evaluation

## 2016-03-12 NOTE — Op Note (Signed)
[facility] Patient Name: Carlos Gutierrez Procedure Date: 03/12/2016 MRN: [id number] Attending MD: [name] , MD Date of Birth: 04-Oct-1950 CSN: [id number] Age: 65 Admit Type: Outpatient Procedure:                Colonoscopy Indications:              Screening for colorectal malignant neoplasm Providers:                [name], MD, Hilma Favors, RN, Herron Dalton, Technician Referring MD:              Medicines:                Propofol per Anesthesia Complications:            No immediate complications. Estimated Blood Loss:     Estimated blood loss was minimal. Procedure:                Pre-Anesthesia Assessment:                           - Prior to the procedure, a History and Physical                            was performed, and patient medications and                            allergies were reviewed. The patient's tolerance of                            previous anesthesia was also reviewed. The risks                            and benefits of the procedure and the sedation                            options and risks were discussed with the patient.                            All questions were answered, and informed consent                            was obtained. Prior Anticoagulants: The patient has                            taken no previous anticoagulant or antiplatelet                            agents. ASA Grade Assessment: III - A patient with                            severe systemic disease. After reviewing the risks  and benefits, the patient was deemed in                            satisfactory condition to undergo the procedure.                           - Sedation was administered by an anesthesia                            professional. Deep sedation was attained.                           After obtaining informed consent, the colonoscope                            was passed  under direct vision. Throughout the                            procedure, the patient's blood pressure, pulse, and                            oxygen saturations were monitored continuously. The                            EC-[id number] scope was introduced through                            the anus and advanced to the the cecum, identified                            by appendiceal orifice and ileocecal valve. The                            colonoscopy was performed without difficulty. The                            patient tolerated the procedure well. The quality                            of the bowel preparation was excellent. The                            ileocecal valve, appendiceal orifice, and rectum                            were photographed. Scope In: 1:24:09 PM Scope Out: 1:37:25 PM Scope Withdrawal Time: 0 hours 10 minutes 54 seconds  Total Procedure Duration: 0 hours 13 minutes 16 seconds  Findings:      Two sessile polyps were found in the transverse colon. The polyps were 2       to 3 mm in size. These polyps were removed with a cold snare. Resection       and retrieval were complete.      A few medium-mouthed diverticula were found in the sigmoid colon. Impression:               -  Two 2 to 3 mm polyps in the transverse colon,                            removed with a cold snare. Resected and retrieved.                           - Diverticulosis in the sigmoid colon. Moderate Sedation:      N/A- Per Anesthesia Care Recommendation:           - Patient has a contact number available for                            emergencies. The signs and symptoms of potential                            delayed complications were discussed with the                            patient. Return to normal activities tomorrow.                            Written discharge instructions were provided to the                            patient.                           - Resume previous  diet.                           - Continue present medications.                           - Await pathology results.                           - Repeat colonoscopy in 5 years for surveillance. Procedure Code(s):        --- Professional ---                           [phone], Colonoscopy, flexible; with removal of                            tumor(s), polyp(s), or other lesion(s) by snare                            technique Diagnosis Code(s):        --- Professional ---                           Z12.11, Encounter for screening for malignant                            neoplasm of colon  D12.3, Benign neoplasm of transverse colon (hepatic                            flexure or splenic flexure)                           K57.30, Diverticulosis of large intestine without                            perforation or abscess without bleeding CPT copyright 2016 American Medical Association. All rights reserved. The codes documented in this report are preliminary and upon coder review may  be revised to meet current compliance requirements. [name], MD [name], MD 03/12/2016 1:41:14 PM This report has been signed electronically. Number of Addenda: 0

## 2016-03-12 NOTE — Transfer of Care (Signed)
Immediate Anesthesia Transfer of Care Note  Patient: Carlos Gutierrez  Procedure(s) Performed: Procedure(s): COLONOSCOPY WITH PROPOFOL (N/A)  Patient Location: PACU  Anesthesia Type:MAC  Level of Consciousness:  sedated, patient cooperative and responds to stimulation  Airway & Oxygen Therapy:Patient Spontanous Breathing and Patient connected to face mask oxgen  Post-op Assessment:  Report given to PACU RN and Post -op Vital signs reviewed and stable  Post vital signs:  Reviewed and stable  Last Vitals:  Vitals:   03/12/16 1220  BP: 132/78  Pulse: 63  Temp: 123XX123 C    Complications: No apparent anesthesia complications

## 2016-03-12 NOTE — Discharge Instructions (Signed)

## 2016-03-12 NOTE — Anesthesia Postprocedure Evaluation (Signed)
Anesthesia Post Note  Patient: Carlos Gutierrez  Procedure(s) Performed: Procedure(s) (LRB): COLONOSCOPY WITH PROPOFOL (N/A)  Patient location during evaluation: PACU Anesthesia Type: MAC Level of consciousness: awake and alert Pain management: pain level controlled Vital Signs Assessment: post-procedure vital signs reviewed and stable Respiratory status: spontaneous breathing, nonlabored ventilation, respiratory function stable and patient connected to nasal cannula oxygen Cardiovascular status: stable and blood pressure returned to baseline Anesthetic complications: no       Last Vitals:  Vitals:   03/12/16 1220  BP: 132/78  Pulse: 63  Temp: 36.6 C    Last Pain:  Vitals:   03/12/16 1220  TempSrc: Oral                 Gaius Ishaq DAVID

## 2016-03-16 ENCOUNTER — Encounter (HOSPITAL_COMMUNITY): Payer: Self-pay | Admitting: Gastroenterology

## 2016-03-26 DIAGNOSIS — E785 Hyperlipidemia, unspecified: Secondary | ICD-10-CM | POA: Diagnosis not present

## 2016-03-26 DIAGNOSIS — R7301 Impaired fasting glucose: Secondary | ICD-10-CM | POA: Diagnosis not present

## 2016-03-26 DIAGNOSIS — Z125 Encounter for screening for malignant neoplasm of prostate: Secondary | ICD-10-CM | POA: Diagnosis not present

## 2016-03-26 DIAGNOSIS — Z0000 Encounter for general adult medical examination without abnormal findings: Secondary | ICD-10-CM | POA: Diagnosis not present

## 2016-05-27 DIAGNOSIS — L814 Other melanin hyperpigmentation: Secondary | ICD-10-CM | POA: Diagnosis not present

## 2016-05-27 DIAGNOSIS — D1801 Hemangioma of skin and subcutaneous tissue: Secondary | ICD-10-CM | POA: Diagnosis not present

## 2016-05-27 DIAGNOSIS — L570 Actinic keratosis: Secondary | ICD-10-CM | POA: Diagnosis not present

## 2016-05-27 DIAGNOSIS — Z85828 Personal history of other malignant neoplasm of skin: Secondary | ICD-10-CM | POA: Diagnosis not present

## 2016-05-27 DIAGNOSIS — L821 Other seborrheic keratosis: Secondary | ICD-10-CM | POA: Diagnosis not present

## 2016-09-21 ENCOUNTER — Observation Stay (HOSPITAL_COMMUNITY)
Admission: EM | Admit: 2016-09-21 | Discharge: 2016-09-22 | Disposition: A | Discharge status: Home / Self Care | Payer: BLUE CROSS/BLUE SHIELD | Attending: Internal Medicine | Admitting: Internal Medicine

## 2016-09-21 ENCOUNTER — Observation Stay (HOSPITAL_BASED_OUTPATIENT_CLINIC_OR_DEPARTMENT_OTHER)
Admit: 2016-09-21 | Discharge: 2016-09-21 | Disposition: A | Discharge status: Home / Self Care | Payer: BLUE CROSS/BLUE SHIELD | Attending: Emergency Medicine | Admitting: Emergency Medicine

## 2016-09-21 ENCOUNTER — Encounter (HOSPITAL_COMMUNITY): Payer: Self-pay | Admitting: Emergency Medicine

## 2016-09-21 ENCOUNTER — Emergency Department (HOSPITAL_COMMUNITY): Payer: BLUE CROSS/BLUE SHIELD

## 2016-09-21 ENCOUNTER — Other Ambulatory Visit: Payer: Self-pay

## 2016-09-21 ENCOUNTER — Observation Stay (HOSPITAL_BASED_OUTPATIENT_CLINIC_OR_DEPARTMENT_OTHER): Payer: BLUE CROSS/BLUE SHIELD

## 2016-09-21 ENCOUNTER — Observation Stay (HOSPITAL_COMMUNITY): Payer: BLUE CROSS/BLUE SHIELD

## 2016-09-21 DIAGNOSIS — I4891 Unspecified atrial fibrillation: Secondary | ICD-10-CM | POA: Diagnosis present

## 2016-09-21 DIAGNOSIS — Z79899 Other long term (current) drug therapy: Secondary | ICD-10-CM | POA: Insufficient documentation

## 2016-09-21 DIAGNOSIS — Z6831 Body mass index (BMI) 31.0-31.9, adult: Secondary | ICD-10-CM | POA: Insufficient documentation

## 2016-09-21 DIAGNOSIS — E669 Obesity, unspecified: Secondary | ICD-10-CM | POA: Diagnosis present

## 2016-09-21 DIAGNOSIS — R0789 Other chest pain: Secondary | ICD-10-CM | POA: Diagnosis not present

## 2016-09-21 DIAGNOSIS — M7989 Other specified soft tissue disorders: Secondary | ICD-10-CM | POA: Diagnosis not present

## 2016-09-21 DIAGNOSIS — I209 Angina pectoris, unspecified: Secondary | ICD-10-CM | POA: Diagnosis not present

## 2016-09-21 DIAGNOSIS — I2699 Other pulmonary embolism without acute cor pulmonale: Secondary | ICD-10-CM

## 2016-09-21 DIAGNOSIS — E7800 Pure hypercholesterolemia, unspecified: Secondary | ICD-10-CM | POA: Diagnosis not present

## 2016-09-21 DIAGNOSIS — Z9989 Dependence on other enabling machines and devices: Secondary | ICD-10-CM

## 2016-09-21 DIAGNOSIS — I10 Essential (primary) hypertension: Secondary | ICD-10-CM | POA: Insufficient documentation

## 2016-09-21 DIAGNOSIS — I480 Paroxysmal atrial fibrillation: Secondary | ICD-10-CM | POA: Diagnosis not present

## 2016-09-21 DIAGNOSIS — G4733 Obstructive sleep apnea (adult) (pediatric): Secondary | ICD-10-CM | POA: Diagnosis not present

## 2016-09-21 DIAGNOSIS — G4700 Insomnia, unspecified: Secondary | ICD-10-CM | POA: Diagnosis present

## 2016-09-21 DIAGNOSIS — E785 Hyperlipidemia, unspecified: Secondary | ICD-10-CM | POA: Insufficient documentation

## 2016-09-21 DIAGNOSIS — Z7982 Long term (current) use of aspirin: Secondary | ICD-10-CM | POA: Insufficient documentation

## 2016-09-21 DIAGNOSIS — Z7901 Long term (current) use of anticoagulants: Secondary | ICD-10-CM | POA: Diagnosis not present

## 2016-09-21 DIAGNOSIS — I2510 Atherosclerotic heart disease of native coronary artery without angina pectoris: Secondary | ICD-10-CM | POA: Insufficient documentation

## 2016-09-21 DIAGNOSIS — R079 Chest pain, unspecified: Secondary | ICD-10-CM

## 2016-09-21 DIAGNOSIS — R072 Precordial pain: Secondary | ICD-10-CM | POA: Diagnosis not present

## 2016-09-21 DIAGNOSIS — R52 Pain, unspecified: Secondary | ICD-10-CM

## 2016-09-21 DIAGNOSIS — J90 Pleural effusion, not elsewhere classified: Secondary | ICD-10-CM | POA: Insufficient documentation

## 2016-09-21 DIAGNOSIS — I4892 Unspecified atrial flutter: Secondary | ICD-10-CM

## 2016-09-21 LAB — BASIC METABOLIC PANEL
Anion gap: 10 (ref 5–15)
BUN: 20 mg/dL (ref 6–20)
CO2: 22 mmol/L (ref 22–32)
CREATININE: 1.14 mg/dL (ref 0.61–1.24)
Calcium: 9.6 mg/dL (ref 8.9–10.3)
Chloride: 104 mmol/L (ref 101–111)
GFR calc Af Amer: >60 mL/min (ref >60)
Glucose, Bld: 185 mg/dL — ABNORMAL HIGH (ref 65–99)
POTASSIUM: 3.8 mmol/L (ref 3.5–5.1)
SODIUM: 136 mmol/L (ref 135–145)

## 2016-09-21 LAB — CBC
HCT: 45.9 % (ref 39.0–52.0)
Hemoglobin: 15.7 g/dL (ref 13.0–17.0)
MCH: 30.9 pg (ref 26.0–34.0)
MCHC: 34.2 g/dL (ref 30.0–36.0)
MCV: 90.4 fL (ref 78.0–100.0)
Platelets: 177 10*3/uL (ref 150–400)
RBC: 5.08 MIL/uL (ref 4.22–5.81)
RDW: 14.2 % (ref 11.5–15.5)
WBC: 10.4 10*3/uL (ref 4.0–10.5)

## 2016-09-21 LAB — TROPONIN I
Troponin I: <0.03 ng/mL (ref <0.03)
Troponin I: <0.03 ng/mL (ref <0.03)

## 2016-09-21 LAB — I-STAT TROPONIN, ED
TROPONIN I, POC: 0 ng/mL (ref 0.00–0.08)
Troponin i, poc: 0 ng/mL (ref 0.00–0.08)

## 2016-09-21 LAB — GLUCOSE, CAPILLARY
GLUCOSE-CAPILLARY: 110 mg/dL — AB (ref 65–99)
Glucose-Capillary: 138 mg/dL — ABNORMAL HIGH (ref 65–99)

## 2016-09-21 LAB — ECHOCARDIOGRAM COMPLETE
HEIGHTINCHES: 73 in
WEIGHTICAEL: 3747.36 [oz_av]

## 2016-09-21 LAB — MAGNESIUM: Magnesium: 2 mg/dL (ref 1.7–2.4)

## 2016-09-21 LAB — D-DIMER, QUANTITATIVE (NOT AT ARMC): D DIMER QUANT: 0.57 ug{FEU}/mL — AB (ref 0.00–0.50)

## 2016-09-21 MED ORDER — HEPARIN SODIUM (PORCINE) 5000 UNIT/ML IJ SOLN: 5000.0000 [IU] | Freq: Three times a day (TID) | INTRAMUSCULAR | Status: DC

## 2016-09-21 MED ORDER — SODIUM CHLORIDE 0.9 % IV SOLN
INTRAVENOUS | Status: DC
  Administered 2016-09-21 (×2): via INTRAVENOUS

## 2016-09-21 MED ORDER — FLECAINIDE ACETATE 100 MG PO TABS: 200.0000 mg | ORAL_TABLET | Freq: Every day | ORAL | Status: DC | PRN

## 2016-09-21 MED ORDER — MORPHINE SULFATE (PF) 4 MG/ML IV SOLN: 1.0000 mg | INTRAVENOUS | Status: DC | PRN

## 2016-09-21 MED ORDER — PANTOPRAZOLE SODIUM 40 MG PO TBEC
40.0000 mg | DELAYED_RELEASE_TABLET | Freq: Two times a day (BID) | ORAL | Status: DC
  Administered 2016-09-21 – 2016-09-22 (×2): 40 mg via ORAL
  Filled 2016-09-21 (×2): qty 1

## 2016-09-21 MED ORDER — ZOLPIDEM TARTRATE 5 MG PO TABS
5.0000 mg | ORAL_TABLET | Freq: Every day | ORAL | Status: DC
  Administered 2016-09-21: 5 mg via ORAL
  Filled 2016-09-21: qty 1

## 2016-09-21 MED ORDER — MORPHINE SULFATE (PF) 4 MG/ML IV SOLN: 2.0000 mg | INTRAVENOUS | Status: DC | PRN

## 2016-09-21 MED ORDER — ASPIRIN 325 MG PO TABS
325.0000 mg | ORAL_TABLET | Freq: Every day | ORAL | Status: DC
  Administered 2016-09-22: 325 mg via ORAL
  Filled 2016-09-21: qty 1

## 2016-09-21 MED ORDER — GI COCKTAIL ~~LOC~~: 30.0000 mL | Freq: Four times a day (QID) | ORAL | Status: DC | PRN

## 2016-09-21 MED ORDER — RIVAROXABAN 15 MG PO TABS
15.0000 mg | ORAL_TABLET | Freq: Two times a day (BID) | ORAL | Status: DC
  Administered 2016-09-21 – 2016-09-22 (×3): 15 mg via ORAL
  Filled 2016-09-21 (×3): qty 1

## 2016-09-21 MED ORDER — ASPIRIN 81 MG PO CHEW
324.0000 mg | CHEWABLE_TABLET | Freq: Once | ORAL | Status: AC
  Administered 2016-09-21: 324 mg via ORAL
  Filled 2016-09-21: qty 4

## 2016-09-21 MED ORDER — ATORVASTATIN CALCIUM 20 MG PO TABS
20.0000 mg | ORAL_TABLET | Freq: Every day | ORAL | Status: DC
  Administered 2016-09-21: 20 mg via ORAL
  Filled 2016-09-21: qty 1

## 2016-09-21 MED ORDER — HYDROCHLOROTHIAZIDE 12.5 MG PO CAPS
12.5000 mg | ORAL_CAPSULE | Freq: Every day | ORAL | Status: DC
  Administered 2016-09-22: 12.5 mg via ORAL
  Filled 2016-09-21: qty 1

## 2016-09-21 MED ORDER — PERFLUTREN LIPID MICROSPHERE
1.0000 mL | INTRAVENOUS | Status: AC | PRN
  Administered 2016-09-21: 5 mL via INTRAVENOUS
  Filled 2016-09-21: qty 10

## 2016-09-21 MED ORDER — NITROGLYCERIN IN D5W 200-5 MCG/ML-% IV SOLN
2.0000 ug/min | INTRAVENOUS | Status: DC
  Filled 2016-09-21: qty 250

## 2016-09-21 MED ORDER — HEPARIN BOLUS VIA INFUSION
6000.0000 [IU] | Freq: Once | INTRAVENOUS | Status: AC
  Administered 2016-09-21: 6000 [IU] via INTRAVENOUS
  Filled 2016-09-21: qty 6000

## 2016-09-21 MED ORDER — HEPARIN (PORCINE) IN NACL 100-0.45 UNIT/ML-% IJ SOLN
1550.0000 [IU]/h | INTRAMUSCULAR | Status: AC
  Administered 2016-09-21: 1550 [IU]/h via INTRAVENOUS
  Filled 2016-09-21 (×2): qty 250

## 2016-09-21 MED ORDER — ONDANSETRON HCL 4 MG/2ML IJ SOLN: 4.0000 mg | Freq: Four times a day (QID) | INTRAMUSCULAR | Status: DC | PRN

## 2016-09-21 MED ORDER — NITROGLYCERIN 0.4 MG SL SUBL
0.4000 mg | SUBLINGUAL_TABLET | SUBLINGUAL | Status: DC | PRN
  Administered 2016-09-21 (×2): 0.4 mg via SUBLINGUAL
  Filled 2016-09-21: qty 1

## 2016-09-21 MED ORDER — ACETAMINOPHEN 325 MG PO TABS: 650.0000 mg | ORAL_TABLET | ORAL | Status: DC | PRN

## 2016-09-21 MED ORDER — IRBESARTAN 300 MG PO TABS
150.0000 mg | ORAL_TABLET | Freq: Every day | ORAL | Status: DC
  Administered 2016-09-22: 150 mg via ORAL
  Filled 2016-09-21: qty 1

## 2016-09-21 MED ORDER — MORPHINE SULFATE (PF) 4 MG/ML IV SOLN
4.0000 mg | Freq: Once | INTRAVENOUS | Status: AC
  Administered 2016-09-21: 4 mg via INTRAVENOUS
  Filled 2016-09-21: qty 1

## 2016-09-21 MED ORDER — IOPAMIDOL (ISOVUE-370) INJECTION 76%
INTRAVENOUS | Status: AC
  Administered 2016-09-21: 100 mL
  Filled 2016-09-21: qty 100

## 2016-09-21 MED ORDER — OLMESARTAN MEDOXOMIL-HCTZ 20-12.5 MG PO TABS: 1.0000 | ORAL_TABLET | Freq: Every day | ORAL | Status: DC

## 2016-09-21 NOTE — ED Provider Notes (Addendum)
[facility] DEPT Provider Note   CSN: [id number] Arrival date & time: 09/21/16  0218     History   Chief Complaint Chief Complaint  Patient presents with  . Chest Pain  . Shortness of Breath    HPI Carlos Gutierrez is a 66 y.o. male.  HPI Pt comes in with cc of chest pain. Pt has hx of AF, non obstructive coronary disease, OSA, obesity. He reports that she stared having chest pain at 11 pm. Pt tried to go rest, but woke up with severe central chest pain with radiation towards the L side - and so he decided to come to the ER. Pt has PAF hx, and he noted that he was in AF, and took his pill in the pocket flecainide. Pt reports that when he get AF, he never has chest pain. Pt had associated dib. He also reports that he has had some dib with exertion over the past 3 months and ankle swelling. Pt has no hx of PE, DVT - but he travels extensively for work.  Pt also not on blood thinners for PAF, he is on aspirin full dose.  Past Medical History:  Diagnosis Date  . Atrial fibrillation ([place]) 2012   Rare occurrences. On ASA & Flecainide prn  . Atrial flutter ([place])    s/p ablation 2008. On Flecainide prn.  . Broken leg 1968  . Concussion 1966   History of 4  . Dyslipidemia   . Essential hypertension, benign   . Low back pain   . Obesity (BMI 30-39.9)   . Obstructive sleep apnea on CPAP    Mild  . PAF (paroxysmal atrial fibrillation) ([place])    Rare occurrences. On ASA & Flecainide prn  . Pure hypercholesterolemia    On Lipitor   . Right shoulder pain    Intermittent  . Status post ablation of atrial flutter 2008    Patient Active Problem List   Diagnosis Date Noted  . Chest pain with moderate risk for cardiac etiology 09/21/2016  . Chest pain 09/21/2016  . Pulmonary embolus ([place])   . Umbilical hernia 54/62/7035  . Chest pain with low risk for cardiac etiology 08/28/2014  . Atrial flutter ([place]) 09/28/2013  . Encounter for long-term (current) use of other medications  09/28/2013  . Insomnia 04/13/2013  . Obstructive sleep apnea on CPAP   . HTN (hypertension)   . Obesity (BMI 30-39.9)   . Atrial fibrillation ([place]) 01/21/2011    Past Surgical History:  Procedure Laterality Date  . ablation for atrial flutter  01/2002  . CARDIAC CATHETERIZATION  12/26/01  . COLONOSCOPY WITH PROPOFOL N/A 03/12/2016   Procedure: COLONOSCOPY WITH PROPOFOL;  Surgeon: [name], MD;  Location: [facility] ENDOSCOPY;  Service: Endoscopy;  Laterality: N/A;  . coronary angiography  1997   40% stenosis  . right knee surgery  1996   ACL, meniscus problems  . UMBILICAL HERNIA REPAIR  04/2015       Home Medications    Prior to Admission medications   Medication Sig Start Date End Date Taking? Authorizing Provider  aspirin 325 MG tablet Take 325 mg by mouth daily.     Yes [provider]  atorvastatin (LIPITOR) 20 MG tablet Take 20 mg by mouth daily with supper.    Yes [provider]  cetirizine (ZYRTEC) 10 MG tablet Take 10 mg by mouth at bedtime as needed for allergies.   Yes [provider]  Eszopiclone 3 MG TABS Take 1.5-3 mg by  mouth at bedtime as needed (sleep). Take immediately before bedtime    Yes [provider]  flecainide (TAMBOCOR) 100 MG tablet Take two tablets (200 mg) as directed Patient taking differently: Take 200 mg by mouth daily as needed (heart rythm). Take two tablets (200 mg) as directed 02/25/16  Yes Weaver, [name] T, PA-C  ibuprofen (ADVIL,MOTRIN) 200 MG tablet Take 200 mg by mouth every 6 (six) hours as needed for headache or moderate pain.   Yes [provider]  loratadine (CLARITIN) 10 MG tablet Take 10 mg by mouth daily as needed for allergies.   Yes [provider]  Multiple Vitamins-Minerals (MULTIVITAMIN WITH MINERALS) tablet Take 1 tablet by mouth daily.     Yes [provider]  olmesartan-hydrochlorothiazide (BENICAR HCT) 20-12.5 MG tablet Take 1 tablet by mouth daily. 03/03/16  Yes  Richardson Dopp T, PA-C    Family History Family History  Problem Relation Age of Onset  . Hypertension Mother 51  . Diabetes Mother   . Heart disease Father 61  . Hypertension Father   . Heart failure Father   . Heart attack Father   . Diabetes Brother 42  . Hypertension Brother 48  . Heart attack Paternal Grandfather   . Stroke Neg Hx     Social History Social History  Substance Use Topics  . Smoking status: Never Smoker  . Smokeless tobacco: Never Used  . Alcohol use 4.2 - 6.0 oz/week    7 - 10 Glasses of wine per week     Allergies   Patient has no known allergies.   Review of Systems Review of Systems   Physical Exam Updated Vital Signs BP 126/67 (BP Location: Left Arm)   Pulse (!) 54   Temp 98.5 F (36.9 C) (Oral)   Resp 20   Ht 6\' 1"  (1.854 m)   Wt 106.2 kg (234 lb 3.4 oz)   SpO2 98%   BMI 30.90 kg/m   Physical Exam   ED Treatments / Results  Labs (all labs ordered are listed, but only abnormal results are displayed) Labs Reviewed  BASIC METABOLIC PANEL - Abnormal; Notable for the following:       Result Value   Glucose, Bld 185 (*)    All other components within normal limits  D-DIMER, QUANTITATIVE (NOT AT [facility]) - Abnormal; Notable for the following:    D-Dimer, Quant 0.57 (*)    All other components within normal limits  GLUCOSE, CAPILLARY - Abnormal; Notable for the following:    Glucose-Capillary 110 (*)    All other components within normal limits  GLUCOSE, CAPILLARY - Abnormal; Notable for the following:    Glucose-Capillary 138 (*)    All other components within normal limits  CBC  MAGNESIUM  TROPONIN I  TROPONIN I  TROPONIN I  I-STAT TROPOININ, ED  I-STAT TROPOININ, ED    EKG  EKG Interpretation  Date/Time:  Tuesday September 21 2016 03:44:35 EDT Ventricular Rate:  66 PR Interval:    QRS Duration: 102 QT Interval:  400 QTC Calculation: 419 R Axis:   53 Text Interpretation:  Atrial fibrillation Abnormal ECG Nonspecific ST  and T wave abnormality Confirmed by Varney Biles ([id number]) on 09/21/2016 6:16:17 AM       Radiology Dg Chest 2 View  Result Date: 09/21/2016 CLINICAL DATA:  66 year old male with chest pain. EXAM: CHEST  2 VIEW COMPARISON:  Chest radiograph dated 08/25/2014 FINDINGS: Bibasilar streaky atelectatic changes. No focal consolidation, pleural effusion, or pneumothorax. The  cardiac silhouette is within normal limits. No acute osseous pathology. IMPRESSION: No active cardiopulmonary disease. Electronically Signed   By: Anner Crete M.D.   On: 09/21/2016 02:55   Ct Angio Chest Pe W Or Wo Contrast  Result Date: 09/21/2016 CLINICAL DATA:  Chest pain EXAM: CT ANGIOGRAPHY CHEST WITH CONTRAST TECHNIQUE: Multidetector CT imaging of the chest was performed using the standard protocol during bolus administration of intravenous contrast. Multiplanar CT image reconstructions and MIPs were obtained to evaluate the vascular anatomy. CONTRAST:  100 mL Isovue 370 COMPARISON:  None. FINDINGS: Cardiovascular: Satisfactory opacification of the pulmonary arteries to the segmental level. No evidence of pulmonary embolism. Normal heart size. No pericardial effusion. Thoracic aorta is normal in caliber. No thoracic aortic dissection. 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 Mediastinum/Nodes: No enlarged mediastinal, hilar, or axillary lymph nodes. Thyroid gland, trachea, and esophagus demonstrate no significant findings. Lungs/Pleura: Lungs are clear. No pleural effusion or pneumothorax. Upper Abdomen: No acute abnormality. Musculoskeletal: No chest wall abnormality. No acute or significant osseous findings. Review of the MIP images confirms the above findings. IMPRESSION: 1. No evidence of pulmonary embolus. 2. No thoracic aortic aneurysm or dissection. Electronically Signed   By: Kathreen Devoid   On: 09/21/2016 11:39    Procedures Procedures (including critical care time)  Medications Ordered in  ED Medications  nitroGLYCERIN (NITROSTAT) SL tablet 0.4 mg (0.4 mg Sublingual Given 09/21/16 0635)  zolpidem (AMBIEN) tablet 5 mg (5 mg Oral Given 09/21/16 2121)  atorvastatin (LIPITOR) tablet 20 mg (20 mg Oral Given 09/21/16 1706)  aspirin tablet 325 mg (not administered)  0.9 %  sodium chloride infusion ( Intravenous New Bag/Given 09/21/16 2127)  gi cocktail (Maalox,Lidocaine,Donnatal) (not administered)  acetaminophen (TYLENOL) tablet 650 mg (not administered)  ondansetron (ZOFRAN) injection 4 mg (not administered)  morphine 4 MG/ML injection 2-4 mg (not administered)  heparin ADULT infusion 100 units/mL (25000 units/246mL sodium chloride 0.45%) (0 Units/hr Intravenous Stopped 09/21/16 1316)  irbesartan (AVAPRO) tablet 150 mg (not administered)    And  hydrochlorothiazide (MICROZIDE) capsule 12.5 mg (not administered)  Rivaroxaban (XARELTO) tablet 15 mg (15 mg Oral Given 09/21/16 2121)  flecainide (TAMBOCOR) tablet 200 mg (not administered)  perflutren lipid microspheres (DEFINITY) IV suspension (5 mLs Intravenous Given 09/21/16 1549)  pantoprazole (PROTONIX) EC tablet 40 mg (40 mg Oral Not Given 09/21/16 2119)  aspirin chewable tablet 324 mg (324 mg Oral Given 09/21/16 0625)  morphine 4 MG/ML injection 4 mg (4 mg Intravenous Given 09/21/16 0753)  heparin bolus via infusion 6,000 Units (6,000 Units Intravenous Bolus from Bag 09/21/16 1049)  iopamidol (ISOVUE-370) 76 % injection (100 mLs  Contrast Given 09/21/16 1124)     Initial Impression / Assessment and Plan / ED Course  I have reviewed the triage vital signs and the nursing notes.  Pertinent labs & imaging results that were available during my care of the patient were reviewed by me and considered in my medical decision making (see chart for details).     Pt comes in with cc of chest pain. Pt has multiple cardiac risk factors. Trops ordered. EKG ordered. Atypical pain, not pleuritic. Concerns for ACS highest.   Pt also has L ankle  swelling. Has long distance travel hx. DVT study ordered.  Medicine consulted for admission. Cards on board.  Pt also has AF hx. On aspirin only. He took flecainide prior to ER arrival (pill in the pocket). Will have cards address anticoagulation..  CHA2DS2/VAS Stroke Risk Points  2 >= 2 Points: High Risk  1 - 1.99 Points: Medium Risk  0 Points: Low Risk    The previous score was 1 on 10/08/2015.:  Change:         Details    Note: External data might be a factor in metrics not marked with    Points Metrics   This score determines the patient's risk of having a stroke if the  patient has atrial fibrillation.       0 Has Congestive Heart Failure:  No   0 Has Vascular Disease:  No   1 Has Hypertension:  Yes   0 Age:  9   1 Has Diabetes:  Yes   0 Had Stroke:  No Had TIA:  No Had thromboembolism:  No   0 Male:  No          Final Clinical Impressions(s) / ED Diagnoses   Final diagnoses:  PAF (paroxysmal atrial fibrillation) ([place])  Angina pectoris [facility])    New Prescriptions Current Discharge Medication List       Varney Biles, MD 09/22/16 Alena Bills    Varney Biles, MD 10/11/16 2054

## 2016-09-21 NOTE — Progress Notes (Signed)
Lower extremity US returned positive  for a partial, nonobstructing DVT involving the left peroneal veins. All other veins are patent without evidence of thrombus. Continue Heparin per Pharmacy full does.  CT angio chest pending Also, recommend that there patient is on full oral anticoagulation/ NOAC upon discharge In regards to Afib, appreciate Cards following, will need to continue on [place] in view of risk of developing recurrent clots.

## 2016-09-21 NOTE — ED Notes (Signed)
Pt Dr. Kathrynn Humble, pt in a afib and my take his own "pill in a pocket" flecainide and repeat EKG in one hour.

## 2016-09-21 NOTE — ED Triage Notes (Signed)
Pt c/o left cp that stated last night at 2300 getting worse. Pt has a hx of afib takes 324 mg ASA daily. Is having SOB and dizziness.

## 2016-09-21 NOTE — Progress Notes (Signed)
  Echocardiogram 2D Echocardiogram has been performed.  Darlina Sicilian M 09/21/2016, 3:47 PM

## 2016-09-21 NOTE — ED Notes (Signed)
Attempted report to bedside RN and charge RN

## 2016-09-21 NOTE — Progress Notes (Signed)
ANTICOAGULATION CONSULT NOTE - Initial Consult  Pharmacy Consult for Heparin Indication: atrial fibrillation  No Known Allergies  Patient Measurements: Height: 6\' 1"  (185.4 cm) Weight: 238 lb (108 kg) IBW/kg (Calculated) : 79.9 Heparin Dosing Weight: 102.3  Vital Signs: Temp: 98.2 F (36.8 C) (07/10 0227) Temp Source: Oral (07/10 0227) BP: 118/74 (07/10 0745) Pulse Rate: 58 (07/10 0745)  Labs:  Recent Labs  09/21/16 0229 09/21/16 0628  HGB 15.7  --   HCT 45.9  --   PLT 177  --   CREATININE 1.14  --   TROPONINI  --  <0.03    Estimated Creatinine Clearance: 84.4 mL/min (by C-G formula based on SCr of 1.14 mg/dL).   Medical History: Past Medical History:  Diagnosis Date  . Atrial fibrillation ([place]) 2012   Rare occurrences. On ASA & Flecainide prn  . Atrial flutter ([place])    s/p ablation 2008. On Flecainide prn.  . Broken leg 1968  . Concussion 1966   History of 4  . Dyslipidemia   . Essential hypertension, benign   . Low back pain   . Obesity (BMI 30-39.9)   . Obstructive sleep apnea on CPAP    Mild  . PAF (paroxysmal atrial fibrillation) ([place])    Rare occurrences. On ASA & Flecainide prn  . Pure hypercholesterolemia    On Lipitor   . Right shoulder pain    Intermittent  . Status post ablation of atrial flutter 2008    Assessment: 73 yom presented with chest pain. Pharmacy is consulted for heparin dosing for A. Fib. PTA patient is not on anticoagulation. CBC WNL. D-Dimer mildly elevated, cardio workup underway. No bleeding documented.   Goal of Therapy:  Heparin level 0.3-0.7 units/ml Monitor platelets by anticoagulation protocol: Yes   Plan:  Give 6000 units bolus x 1 Start heparin infusion at 1550 units/hr Check anti-Xa level in 6 hours and daily while on heparin Continue to monitor H&H and platelets  Monitor for s/sx of bleeding   Jalene Mullet, Pharm.D. PGY1 Pharmacy Resident 09/21/2016 8:38 AM Main Pharmacy: [phone]

## 2016-09-21 NOTE — Consult Note (Signed)
CARDIOLOGY CONSULT   Patient ID: Carlos Gutierrez MRN: [id number] DOB/AGE: 64-May-1952 64 y.o.  Admit date: 09/21/2016  Primary Physician   London Pepper, MD //Primary Cardiologist   Dr Tamala Julian 09/28/2013, EP: Dr Lovena Le 08/28/2014 Reason for Consultation   Chest pain, rapid atrial fib Requesting MD: Dr Mariane Masters  HPI: [name] is a 66 y.o. male with hx of AFlutter s/p RFCA, paroxysmal AF, HL, HTN, OSA.  He takes Flecainide prn for recurrent AF ("Pill in pocket"). Consider PVI ablation if AF worsened.  Pt is being seen today for the evaluation of chest pain and atrial fib at the request of Dr Mariane Masters.  Pt had onset of chest pain last pm at 11 pm, with minimal activity. He developed SSCP with minor radiation to the L. It worsened to and 8 1/2 out of 10. No N&V, +SOB and + diaphoresis. Sx worsened by movement. When sx did not improve, he came to the ER. He was in rapid afib>>Flecainide. The chest pain was rx'd w/ SL NTG x 2 and ASA. Pain improved to a 5/10.   He went into atrial fib at 11 pm also. He does not usually get CP w/ the fib. He could feel his heart skip. His HR was not fast. He got a bit of a HA with it. In the ER, he took his Flecainide and converted to SR in about 2 hours.   The last episode of atrial fib was 09/18/2016. It is unusual to have 2 episodes this close together. The last episode before 07/07 was 2-3 weeks ago. He gets episodes 2-3 x month routinely, they go away about 2 hours after he takes the Flecainide. He has been on ASA 325 mg qd.   His last overseas trip ended on June 15th. He has traveled since then but domestically, no plane rides over 2 hours. He went to [facility] 07/03-07/05, 5 hours in a car each way. He stopped 2-3 times per trip.   Past Medical History:  Diagnosis Date  . Atrial fibrillation ([place]) 2012   Rare occurrences. On ASA & Flecainide prn  . Atrial flutter ([place])    s/p ablation 2008. On Flecainide prn.  . Broken leg 1968  .  Concussion 1966   History of 4  . Dyslipidemia   . Essential hypertension, benign   . Low back pain   . Obesity (BMI 30-39.9)   . Obstructive sleep apnea on CPAP    Mild  . PAF (paroxysmal atrial fibrillation) ([place])    Rare occurrences. On ASA & Flecainide prn  . Pure hypercholesterolemia    On Lipitor   . Right shoulder pain    Intermittent  . Status post ablation of atrial flutter 2008     Past Surgical History:  Procedure Laterality Date  . ablation for atrial flutter  01/2002  . CARDIAC CATHETERIZATION  12/26/01  . COLONOSCOPY WITH PROPOFOL N/A 03/12/2016   Procedure: COLONOSCOPY WITH PROPOFOL;  Surgeon: [name], MD;  Location: [facility];  Service: Endoscopy;  Laterality: N/A;  . coronary angiography  1997   40% stenosis  . right knee surgery  1996   ACL, meniscus problems  . UMBILICAL HERNIA REPAIR  04/2015    No Known Allergies  I have reviewed the patient's current medications . [START ON 09/22/2016] aspirin  325 mg Oral Daily  . atorvastatin  20 mg Oral Q supper  . heparin  5,000 Units Subcutaneous Q8H  . [START ON 09/22/2016] olmesartan-hydrochlorothiazide  1 tablet Oral Daily  . zolpidem  5 mg Oral QHS   . sodium chloride     acetaminophen, [START ON 09/22/2016] flecainide, gi cocktail, morphine injection, nitroGLYCERIN, ondansetron (ZOFRAN) IV  Prior to Admission medications   Medication Sig Start Date End Date Taking? Authorizing Provider  aspirin 325 MG tablet Take 325 mg by mouth daily.     Yes [provider]  atorvastatin (LIPITOR) 20 MG tablet Take 20 mg by mouth daily with supper.    Yes [provider]  cetirizine (ZYRTEC) 10 MG tablet Take 10 mg by mouth at bedtime as needed for allergies.   Yes [provider]  Eszopiclone 3 MG TABS Take 1.5-3 mg by mouth at bedtime as needed (sleep). Take immediately before bedtime    Yes [provider]  flecainide (TAMBOCOR) 100 MG tablet Take two tablets (200 mg) as  directed Patient taking differently: Take 200 mg by mouth daily as needed (heart rythm). Take two tablets (200 mg) as directed 02/25/16  Yes Weaver, [name] T, PA-C  ibuprofen (ADVIL,MOTRIN) 200 MG tablet Take 200 mg by mouth every 6 (six) hours as needed for headache or moderate pain.   Yes [provider]  loratadine (CLARITIN) 10 MG tablet Take 10 mg by mouth daily as needed for allergies.   Yes [provider]  Multiple Vitamins-Minerals (MULTIVITAMIN WITH MINERALS) tablet Take 1 tablet by mouth daily.     Yes [provider]  olmesartan-hydrochlorothiazide (BENICAR HCT) 20-12.5 MG tablet Take 1 tablet by mouth daily. 03/03/16  Yes Liliane Shi, PA-C     Social History   Social History  . Marital status: Married    Spouse name: N/A  . Number of children: 2  . Years of education: N/A   Occupational History  . product manager Other   Social History Main Topics  . Smoking status: Never Smoker  . Smokeless tobacco: Never Used  . Alcohol use 4.2 - 6.0 oz/week    7 - 10 Glasses of wine per week  . Drug use: No  . Sexual activity: Not on file   Other Topics Concern  . Not on file   Social History Narrative   Caffeine: yes, coffee 2-3 cups daily, tea-rare.    Exercise-yes, 2-3X weekly, jog/run.    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    Marital Status: Married.    Children: 2 children-grown                Family Status  Relation Status  . Mother Deceased  . Father Deceased  . Sister Alive  . Brother Alive  . Brother (Not Specified)  . Brother (Not Specified)  . PGF (Not Specified)  . Neg Hx (Not Specified)   Family History  Problem Relation Age of Onset  . Hypertension Mother 72  . Diabetes Mother   . Heart disease Father 31  . Hypertension Father   . Heart failure Father   . Heart attack Father   . Diabetes Brother 43  . Hypertension Brother 71  . Heart attack  Paternal Grandfather   . Stroke Neg Hx      ROS:  Full 14 point review of systems complete and found to be negative unless listed above.  Physical Exam: Blood pressure 118/74, pulse (!) 58, temperature 98.2 F (36.8 C), temperature source Oral, resp. rate 11, height 6\' 1"  (1.854 m), weight 238 lb (108 kg), SpO2 99 %.  General: Well developed, well nourished, male in no acute distress Head: Eyes PERRLA, No xanthomas.   Normocephalic and atraumatic, oropharynx without edema or exudate. Dentition: good Lungs: clear bilaterally Heart: HRRR S1 S2, no rub/gallop, No murmur. pulses are 2+ all 4 extrem.   Neck: No carotid bruits. No lymphadenopathy.  JVD not elevated Abdomen: Bowel sounds present, abdomen soft and non-tender without masses or hernias noted. Msk:  No spine or cva tenderness. No weakness, no joint deformities or effusions. Extremities: No clubbing or cyanosis. No edema.  Neuro: Alert and oriented X 3. No focal deficits noted. Psych:  Good affect, responds appropriately Skin: No rashes or lesions noted.  Labs:   Lab Results  Component Value Date   WBC 10.4 09/21/2016   HGB 15.7 09/21/2016   HCT 45.9 09/21/2016   MCV 90.4 09/21/2016   PLT 177 09/21/2016     Recent Labs Lab 09/21/16 0229  NA 136  K 3.8  CL 104  CO2 22  BUN 20  CREATININE 1.14  CALCIUM 9.6  GLUCOSE 185*   Magnesium  Date Value Ref Range Status  09/21/2016 2.0 1.7 - 2.4 mg/dL Final   No results for input(s): CKTOTAL, CKMB, TROPONINI in the last 72 hours.  Recent Labs  09/21/16 0239 09/21/16 0725  TROPIPOC 0.00 0.00   Lab Results  Component Value Date   DDIMER 0.57 (H) 09/21/2016    Echo:  2012 at [facility] Mild LAE EF 60-65%, mild LVH  ECG:  07/10 2:20 am Atrial fib, HR 72, no acute ischemic changes, ?early repol  ECG 08:00 AM SR, no change in QRS morphology (no change from 02/2016)  Radiology:  Dg Chest 2 View  Result Date: 09/21/2016 CLINICAL DATA:  66 year old male with chest  pain. EXAM: CHEST  2 VIEW COMPARISON:  Chest radiograph dated 08/25/2014 FINDINGS: Bibasilar streaky atelectatic changes. No focal consolidation, pleural effusion, or pneumothorax. The cardiac silhouette is within normal limits. No acute osseous pathology. IMPRESSION: No active cardiopulmonary disease. Electronically Signed   By: Anner Crete M.D.   On: 09/21/2016 02:55    ASSESSMENT AND PLAN:   The patient was seen today by Dr Marlou Porch, the patient evaluated and the data reviewed.   Principal Problem:   Chest pain with moderate risk for cardiac etiology - admit, r/o MI - rx pain w/ nitrates and morphine. - need ischemic eval because of the Flecainide - MD advise on MV vs cath - agree w/ ck echo - if ez elevate>>cath - if +PE, defer ischemic eval for now - with mildly elevated d-dimer, afib and CP>>heparin   Active Problems:   Atrial fibrillation ([place]) - converted to SR w/ Flecainide - has been getting episodes 2-3 x month - CHADS2VASC=1 (HTN), will be 2 when he turns 67 - briefly discussed anticoagulation  Otherwise, per IM   Obstructive sleep apnea on CPAP   HTN (hypertension)   Obesity (BMI 30-39.9)   Insomnia   Atrial flutter ([place])   SignedRosaria Ferries, PA-C 09/21/2016 8:01 AM Beeper [phone]  Personally seen and examined. Agree with above.  66 year old male with paroxysmal atrial fibrillation not on anticoagulation currently CHADSVASC 1 (will be 2 next January when he turns 72) here with atypical chest pain 2 negative troponins, nonischemic EKG.  Chest pain began at approximately 11 PM, fairly constant, noted changes when standing for chest x-ray. D-dimer was mildly abnormal. He is an international traveler. Currently chest pain-free. Currently in sinus rhythm after initially being in atrial fibrillation. Took 200  mg of flecainide before coming to emergency room.  Exam, alert and oriented, pleasant, heart is regular rate and rhythm, no significant murmurs, no  edema, lungs are clear.  Atypical chest pain  - Both troponins are normal. Constant somewhat atypical discomfort. Did augment with positional change. Given his mildly elevated d-dimer, I think it is reasonable to check a CT scan to exclude pulmonary embolism especially given his extensive air travel. This has been ordered. If this is normal, given his nonischemic EKG and negative troponins, I think it is reasonable to set him up as an outpatient for an exercise treadmill test. Prior stress test approximally 4 years ago was low risk.  - He has been in the emergency room previously proximally 3 years ago with chest discomfort, thought to be GERD related.  - I do not think that he needs IV nitroglycerin at this time.  Paroxysmal atrial fibrillation  - Pill in the pocket flecainide approach 200 mg.  - Currently converted.  - As discussed in the past with Dr. Lovena Le.  - At age 53 will need anticoagulation.  Once again a CT of the chest is negative for PE, I think would be reasonable to discharge him home from observation given his negative troponins and nonischemic EKG and set him up for outpatient treadmill test. I've discussed this with Rosaria Ferries, PA.  Candee Furbish, MD

## 2016-09-21 NOTE — Progress Notes (Signed)
Patient has active chest pain at 5 after intervention. Reporting RN made aware that a patient with active chest pain is not appropriate and we will facilitate getting the patient to the appropriate department.  Carlos Harps, MSN, BSN, Nature conservation officer of Nursing 3 [place].

## 2016-09-21 NOTE — H&P (Signed)
History and Physical    Carlos Gutierrez UUE:[id number] DOB: 03-Feb-1951 DOA: 09/21/2016   PCP: London Pepper, MD   Patient coming from:  Home    Chief Complaint: Chest Pain   HPI: Carlos Gutierrez is a 66 y.o. male with medical history significant for HTN, HLD, OSA, PAF on Flecainide, presenting for evaluation of chest pain, starting around 11 PM last night, with minimal activity. Chest pain is central, with minimal radiation to the left, described as 8 out of 10. He denies any nausea or vomiting. He does have shortness of breath and diaphoresis. The symptoms are worsen by movement. We he came to the emergency department, symptoms did not improve despite the use of aspirin in and sublingual nitroglycerin. He does report palpitations as well. In the ER, he 2 flecainide, and return to sinus free them in about 2 hours. Of note, the patient just returned from overseas in June 15, but he did travel domestically, last on 7/3- 09/16/2016 to [place], in a 5 hour work car ride each way .He reports LLE  lower extremity swelling, denies calf pain, no erythema. Denies pre-syncopal episodes, palpitations or hemoptysis. Denies history of cancer. Denies tobacco use, alcohol or recreational drug use. Patient denies taking hormone replacement therapy.  Never had a hematological evaluation prior to this admission  Denies any recent infections or sick contacts. Denies history of stroke.   . ED Course:  BP 118/74   Pulse (!) 58   Temp 98.2 F (36.8 C) (Oral)   Resp 11   Ht 6' 1" (1.854 m)   Wt 108 kg (238 lb)   SpO2 99%   BMI 31.40 kg/m   glucose 185  troponin 0 EKGs morning shows now sinus rhythm CBC and C met normal D dimer 0.57  Review of Systems:  As per HPI otherwise all other systems reviewed and are negative  Past Medical History:  Diagnosis Date  . Atrial fibrillation ([place]) 2012   Rare occurrences. On ASA & Flecainide prn  . Atrial flutter ([place])    s/p ablation 2008. On Flecainide prn.    . Broken leg 1968  . Concussion 1966   History of 4  . Dyslipidemia   . Essential hypertension, benign   . Low back pain   . Obesity (BMI 30-39.9)   . Obstructive sleep apnea on CPAP    Mild  . PAF (paroxysmal atrial fibrillation) ([place])    Rare occurrences. On ASA & Flecainide prn  . Pure hypercholesterolemia    On Lipitor   . Right shoulder pain    Intermittent  . Status post ablation of atrial flutter 2008    Past Surgical History:  Procedure Laterality Date  . ablation for atrial flutter  01/2002  . CARDIAC CATHETERIZATION  12/26/01  . COLONOSCOPY WITH PROPOFOL N/A 03/12/2016   Procedure: COLONOSCOPY WITH PROPOFOL;  Surgeon: [name], MD;  Location: [facility];  Service: Endoscopy;  Laterality: N/A;  . coronary angiography  1997   40% stenosis  . right knee surgery  1996   ACL, meniscus problems  . UMBILICAL HERNIA REPAIR  04/2015    Social History Social History   Social History  . Marital status: Married    Spouse name: N/A  . Number of children: 2  . Years of education: N/A   Occupational History  . product manager Other   Social History Main Topics  . Smoking status: Never Smoker  . Smokeless tobacco: Never Used  . Alcohol use 4.2 -  6.0 oz/week    7 - 10 Glasses of wine per week  . Drug use: No  . Sexual activity: Not on file   Other Topics Concern  . Not on file   Social History Narrative   Caffeine: yes, coffee 2-3 cups daily, tea-rare.    Exercise-yes, 2-3X weekly, jog/run.    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    Marital Status: Married.    Children: 2 children-grown                 No Known Allergies  Family History  Problem Relation Age of Onset  . Hypertension Mother 35  . Diabetes Mother   . Heart disease Father 70  . Hypertension Father   . Heart failure Father   . Heart attack Father   . Diabetes Brother 24  . Hypertension Brother 7  . Heart  attack Paternal Grandfather   . Stroke Neg Hx       Prior to Admission medications   Medication Sig Start Date End Date Taking? Authorizing Provider  aspirin 325 MG tablet Take 325 mg by mouth daily.     Yes [provider]  atorvastatin (LIPITOR) 20 MG tablet Take 20 mg by mouth daily with supper.    Yes [provider]  cetirizine (ZYRTEC) 10 MG tablet Take 10 mg by mouth at bedtime as needed for allergies.   Yes [provider]  Eszopiclone 3 MG TABS Take 1.5-3 mg by mouth at bedtime as needed (sleep). Take immediately before bedtime    Yes [provider]  flecainide (TAMBOCOR) 100 MG tablet Take two tablets (200 mg) as directed Patient taking differently: Take 200 mg by mouth daily as needed (heart rythm). Take two tablets (200 mg) as directed 02/25/16  Yes Weaver, [name] T, PA-C  ibuprofen (ADVIL,MOTRIN) 200 MG tablet Take 200 mg by mouth every 6 (six) hours as needed for headache or moderate pain.   Yes [provider]  loratadine (CLARITIN) 10 MG tablet Take 10 mg by mouth daily as needed for allergies.   Yes [provider]  Multiple Vitamins-Minerals (MULTIVITAMIN WITH MINERALS) tablet Take 1 tablet by mouth daily.     Yes [provider]  olmesartan-hydrochlorothiazide (BENICAR HCT) 20-12.5 MG tablet Take 1 tablet by mouth daily. 03/03/16  Yes Richardson Dopp T, PA-C    Physical Exam:  Vitals:   09/21/16 0500 09/21/16 0530 09/21/16 0700 09/21/16 0745  BP: 118/79 138/82 129/79 118/74  Pulse: 71 71 70 (!) 58  Resp: 16 14 (!) 27 11  Temp:      TempSrc:      SpO2: 95% 98% 94% 99%  Weight:      Height:       Constitutional: NAD, calm, comfortable   Eyes: PERRL, lids and conjunctivae normal ENMT: Mucous membranes are moist, without exudate or lesions  Neck: normal, supple, no masses, no thyromegaly Respiratory: clear to auscultation bilaterally, no wheezing, no crackles. Normal respiratory effort  Cardiovascular:  Regular rate and rhythm, no murmurs, rubs or gallops. LLE 1+  extremity edema. 2+ pedal pulses. No carotid bruits.  Abdomen: Soft, non tender, No hepatosplenomegaly. Bowel sounds positive.  Musculoskeletal: no clubbing / cyanosis. Moves all extremities Skin: no jaundice, No lesions.  Neurologic: Sensation intact  Strength equal in all extremities Psychiatric:   Alert and oriented x 3. Normal mood.     Labs on Admission: I have personally reviewed following labs and  imaging studies  CBC:  Recent Labs Lab 09/21/16 0229  WBC 10.4  HGB 15.7  HCT 45.9  MCV 90.4  PLT 299    Basic Metabolic Panel:  Recent Labs Lab 09/21/16 0229  NA 136  K 3.8  CL 104  CO2 22  GLUCOSE 185*  BUN 20  CREATININE 1.14  CALCIUM 9.6  MG 2.0    GFR: Estimated Creatinine Clearance: 84.4 mL/min (by C-G formula based on SCr of 1.14 mg/dL).  Liver Function Tests: No results for input(s): AST, ALT, ALKPHOS, BILITOT, PROT, ALBUMIN in the last 168 hours. No results for input(s): LIPASE, AMYLASE in the last 168 hours. No results for input(s): AMMONIA in the last 168 hours.  Coagulation Profile: No results for input(s): INR, PROTIME in the last 168 hours.  Cardiac Enzymes:  Recent Labs Lab 09/21/16 0628  TROPONINI <0.03    BNP (last 3 results) No results for input(s): PROBNP in the last 8760 hours.  HbA1C: No results for input(s): HGBA1C in the last 72 hours.  CBG: No results for input(s): GLUCAP in the last 168 hours.  Lipid Profile: No results for input(s): CHOL, HDL, LDLCALC, TRIG, CHOLHDL, LDLDIRECT in the last 72 hours.  Thyroid Function Tests: No results for input(s): TSH, T4TOTAL, FREET4, T3FREE, THYROIDAB in the last 72 hours.  Anemia Panel: No results for input(s): VITAMINB12, FOLATE, FERRITIN, TIBC, IRON, RETICCTPCT in the last 72 hours.  Urine analysis: No results found for: COLORURINE, APPEARANCEUR, LABSPEC, PHURINE, GLUCOSEU, HGBUR, BILIRUBINUR, KETONESUR, PROTEINUR,  UROBILINOGEN, NITRITE, LEUKOCYTESUR  Sepsis Labs: _0 (procalcitonin:4,lacticidven:4) )No results found for this or any previous visit (from the past 240 hour(s)).   Radiological Exams on Admission: Dg Chest 2 View  Result Date: 09/21/2016 CLINICAL DATA:  66 year old male with chest pain. EXAM: CHEST  2 VIEW COMPARISON:  Chest radiograph dated 08/25/2014 FINDINGS: Bibasilar streaky atelectatic changes. No focal consolidation, pleural effusion, or pneumothorax. The cardiac silhouette is within normal limits. No acute osseous pathology. IMPRESSION: No active cardiopulmonary disease. Electronically Signed   By: Anner Crete M.D.   On: 09/21/2016 02:55    EKG: Independently reviewed.  Assessment/Plan Principal Problem:   Chest pain with moderate risk for cardiac etiology Active Problems:   Atrial fibrillation (HCC)   Obstructive sleep apnea on CPAP   HTN (hypertension)   Obesity (BMI 30-39.9)   Insomnia   Atrial flutter (HCC)   Chest pain      Chest Pain syndrome versus pleuritic pain. Risk factors include PAF, OSA, HLD, frequent long distance trips, last one to [place] 1 week ago (5 hr trip) PESI  74, Heart score 2-3  Tn neg, EKG normal DDmer 0.57  Afebrile, VSS Admit to SDU  2 D echo CT angio Lower extremity ultrasound  Heparin per pharmacy full dose initiated  and transition to [facility]  Serial troponin EKG Pain meds  NTG and ASA prn  Atrial Fibrillation CHA2DS2-VASc score 1-2 , on anticoagulation with ASA   Rate controlled Continue meds including Flecainide    Hypertension BP 125/77   Pulse   57   Controlled Continue home anti-hypertensive medications    Hyperlipidemia Continue home statins  OSA Continue CPAP nightly  Insomnia Ambien prn  Elevated blood sugar, Glu 185, denies history of DM  A1C  Monitor CBG   DVT prophylaxis:  Heparin  Code Status:   Full      Family Communication:  Discussed with patient Disposition Plan: Expect patient to be  discharged to home after condition improves Consults called:  Cardiology Admission status: SDU obs    Rondel Jumbo, PA-C [facility]   09/21/2016, 8:16 AM

## 2016-09-21 NOTE — Progress Notes (Addendum)
ANTICOAGULATION CONSULT NOTE - Follow Up Consult  Pharmacy Consult for Heparin > Xarelto Indication: atrial fibrillation and DVT  No Known Allergies  Patient Measurements: Height: 6\' 1"  (185.4 cm) Weight: 234 lb 3.4 oz (106.2 kg) IBW/kg (Calculated) : 79.9 Heparin Dosing Weight: 102 kg  Vital Signs: Temp: 98.6 F (37 C) (07/10 1211) Temp Source: Oral (07/10 1211) BP: 119/75 (07/10 1211) Pulse Rate: 60 (07/10 1211)  Labs:  Recent Labs  09/21/16 0229 09/21/16 0628  HGB 15.7  --   HCT 45.9  --   PLT 177  --   CREATININE 1.14  --   TROPONINI  --  <0.03    Estimated Creatinine Clearance: 83.7 mL/min (by C-G formula based on SCr of 1.14 mg/dL).  Assessment:   Heparin drip just started ~10:45am, now to transition to Xarelto for new left leg DVT and atrial fibrillation.  CTA negative for PE.  On Aspirin 325 mg daily as prior to admission.   Goal of Therapy:  Heparin level 0.3-0.7 units/ml appropriate Xarelto dose for indication and renal function Monitor platelets by anticoagulation protocol: Yes   Plan:    Xarelto 15 mg BID x 21 days then 20 mg daily with supper.   IV heparin to stop when giving first Xarelto dose.   Please prescribe Xarelto starter pack at discharge, for first month of treatment.   Decrease Aspirin from 325 to 81 mg daily?  Arty Baumgartner, [place] Pager: [phone] 09/21/2016,12:25 PM

## 2016-09-21 NOTE — Progress Notes (Signed)
Dr Marlou Porch reviewed the LE Dopplers and CT chest. Pt has DVT, but no PE. Coronary calcium noted, once DVT rx for 2-3 months, f/u as outpt for stress testing.   With DVT, CHADS2VASC is now 3, he should be anticoagulated.  Will add Xarelto, treatment dosing and d/c heparin.   Rosaria Ferries, Hershal Coria 09/21/2016 11:49 AM Beeper [phone]

## 2016-09-21 NOTE — Progress Notes (Signed)
Preliminary results by tech - Left Lower Ext. Venous Duplex Completed. Positive for a partial, nonobstructing DVT involving the left peroneal veins. All other veins are patent without evidence of thrombus. Results given to patient's nurse, Esmond Harps. Oda Cogan, BS, RDMS, RVT

## 2016-09-22 ENCOUNTER — Observation Stay (HOSPITAL_COMMUNITY): Payer: BLUE CROSS/BLUE SHIELD

## 2016-09-22 DIAGNOSIS — R079 Chest pain, unspecified: Secondary | ICD-10-CM

## 2016-09-22 DIAGNOSIS — S4991XA Unspecified injury of right shoulder and upper arm, initial encounter: Secondary | ICD-10-CM | POA: Diagnosis not present

## 2016-09-22 DIAGNOSIS — M25511 Pain in right shoulder: Secondary | ICD-10-CM | POA: Diagnosis not present

## 2016-09-22 LAB — GLUCOSE, CAPILLARY
GLUCOSE-CAPILLARY: 138 mg/dL — AB (ref 65–99)
Glucose-Capillary: 135 mg/dL — ABNORMAL HIGH (ref 65–99)

## 2016-09-22 MED ORDER — PANTOPRAZOLE SODIUM 40 MG PO TBEC: 40.0000 mg | DELAYED_RELEASE_TABLET | Freq: Two times a day (BID) | ORAL | 0 refills | Status: DC

## 2016-09-22 MED ORDER — RIVAROXABAN (XARELTO) VTE STARTER PACK (15 & 20 MG): ORAL_TABLET | ORAL | 0 refills | Status: DC

## 2016-09-22 MED ORDER — ASPIRIN EC 81 MG PO TBEC: 81.0000 mg | DELAYED_RELEASE_TABLET | Freq: Every day | ORAL | 0 refills | Status: DC

## 2016-09-22 NOTE — Progress Notes (Signed)
RE: Benefit check  Received: Today  Message Contents  Memory Argue CMA        # 3.  Jamelle Haring [facility]  @ [place] # [phone]   1. XARELTO  15 MG BID   COVER- YES  CO-PAY- $ 35.00  PRIOR APPROVAL- NO   2. XARELTO 20 MG DAILY  COVER- YES  CO-PAY- $ 35.00  PRIOR APPROVAL- NO   RIVAROXABAN- NONE FORMULARY    3. ELIQUIS 2.5 MG BID  COVER- YES  CO-PAY- $ 35.00  PRIOR APPROVAL- NO   4. ELIQUIS 5 MG BID  COVER- YES  CO-PAY- $ 35.00  PRIOR APPROVAL- NO    MAIL-ORDER FOR A 90 DAYS SUPPLY $ 105.00   PHARMACY : [place]

## 2016-09-22 NOTE — Progress Notes (Signed)
09/22/2016- Respiratory care note- Pt placed on CPAP AT 2300.  Pt tolerated well.

## 2016-09-22 NOTE — Progress Notes (Signed)
Benefit check in progress for Xarelto; B Filippa Yarbough RN,MHA,BSN [phone] 

## 2016-09-22 NOTE — Progress Notes (Signed)
Progress Note  Patient Name: Carlos Gutierrez Date of Encounter: 09/22/2016  Primary Cardiologist: Dr. Tamala Julian EP: Dr. Lovena Le   Subjective   Feels fine. No complaints.   Inpatient Medications    Scheduled Meds: . aspirin  325 mg Oral Daily  . atorvastatin  20 mg Oral Q supper  . irbesartan  150 mg Oral Daily   And  . hydrochlorothiazide  12.5 mg Oral Daily  . pantoprazole  40 mg Oral BID  . Rivaroxaban  15 mg Oral BID WC  . zolpidem  5 mg Oral QHS   Continuous Infusions: . sodium chloride 75 mL/hr at 09/21/16 2127   PRN Meds: acetaminophen, flecainide, gi cocktail, morphine injection, nitroGLYCERIN, ondansetron (ZOFRAN) IV   Vital Signs    Vitals:   09/21/16 1211 09/21/16 2015 09/22/16 0052 09/22/16 0633  BP: 119/75 126/67 138/87 (!) 141/81  Pulse: 60 (!) 54 (!) 58 61  Resp: 18 20 20 20   Temp: 98.6 F (37 C) 98.5 F (36.9 C) 98.1 F (36.7 C) 97.7 F (36.5 C)  TempSrc: Oral Oral Oral Oral  SpO2: 98% 98% 98% 98%  Weight:    236 lb 11.2 oz (107.4 kg)  Height:        Intake/Output Summary (Last 24 hours) at 09/22/16 1123 Last data filed at 09/22/16 0700  Gross per 24 hour  Intake           1097.5 ml  Output             1075 ml  Net             22.5 ml   Filed Weights   09/21/16 0227 09/21/16 0938 09/22/16 0633  Weight: 238 lb (108 kg) 234 lb 3.4 oz (106.2 kg) 236 lb 11.2 oz (107.4 kg)    Telemetry    Sinus bradycardia 59 bpm - Personally Reviewed  ECG    NSR - Personally Reviewed  Physical Exam   GEN: No acute distress.   Neck: No JVD Cardiac: RRR, no murmurs, rubs, or gallops.  Respiratory: Clear to auscultation bilaterally. GI: Soft, nontender, non-distended  MS: No edema; No deformity. Neuro:  Nonfocal  Psych: Normal affect   Labs    Chemistry Recent Labs Lab 09/21/16 0229  NA 136  K 3.8  CL 104  CO2 22  GLUCOSE 185*  BUN 20  CREATININE 1.14  CALCIUM 9.6  GFRNONAA >60  GFRAA >60  ANIONGAP 10     Hematology Recent  Labs Lab 09/21/16 0229  WBC 10.4  RBC 5.08  HGB 15.7  HCT 45.9  MCV 90.4  MCH 30.9  MCHC 34.2  RDW 14.2  PLT 177    Cardiac Enzymes Recent Labs Lab 09/21/16 0628 09/21/16 1152 09/21/16 1758  TROPONINI <0.03 <0.03 <0.03    Recent Labs Lab 09/21/16 0239 09/21/16 0725  TROPIPOC 0.00 0.00     BNPNo results for input(s): BNP, PROBNP in the last 168 hours.   DDimer  Recent Labs Lab 09/21/16 0725  DDIMER 0.57*     Radiology    Dg Chest 2 View  Result Date: 09/21/2016 CLINICAL DATA:  66 year old male with chest pain. EXAM: CHEST  2 VIEW COMPARISON:  Chest radiograph dated 08/25/2014 FINDINGS: Bibasilar streaky atelectatic changes. No focal consolidation, pleural effusion, or pneumothorax. The cardiac silhouette is within normal limits. No acute osseous pathology. IMPRESSION: No active cardiopulmonary disease. Electronically Signed   By: Anner Crete M.D.   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 Both joints demonstrate bony spurring, sclerosis, and joint space loss. Mild deformity of the humeral head noted. No acute osseous finding, fracture or malalignment. IMPRESSION: Right shoulder moderate degenerative change of the [facility] joint and glenohumeral joint. No acute osseous finding Electronically Signed   By: Jerilynn Mages.  Shick M.D.   On: 09/22/2016 09:12   Ct Angio Chest Pe W Or Wo Contrast  Result Date: 09/21/2016 CLINICAL DATA:  Chest pain EXAM: CT ANGIOGRAPHY CHEST WITH CONTRAST TECHNIQUE: Multidetector CT imaging of the chest was performed using the standard protocol during bolus administration of intravenous contrast. Multiplanar CT image reconstructions and MIPs were obtained to evaluate the vascular anatomy. CONTRAST:  100 mL Isovue 370  COMPARISON:  None. FINDINGS: Cardiovascular: Satisfactory opacification of the pulmonary arteries to the segmental level. No evidence of pulmonary embolism. Normal heart size. No pericardial effusion. Thoracic aorta is normal in caliber. No thoracic aortic dissection. 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 Mediastinum/Nodes: No enlarged mediastinal, hilar, or axillary lymph nodes. Thyroid gland, trachea, and esophagus demonstrate no significant findings. Lungs/Pleura: Lungs are clear. No pleural effusion or pneumothorax. Upper Abdomen: No acute abnormality. Musculoskeletal: No chest wall abnormality. No acute or significant osseous findings. Review of the MIP images confirms the above findings. IMPRESSION: 1. No evidence of pulmonary embolus. 2. No thoracic aortic aneurysm or dissection. Electronically Signed   By: Kathreen Devoid   On: 09/21/2016 11:39    Cardiac Studies   2D Echo 09/21/16 Study Conclusions  - Left ventricle: The cavity size was normal. Wall thickness was   increased in a pattern of mild LVH. Systolic function was normal.   The estimated ejection fraction was in the range of 55% to 60%.   Wall motion was normal; there were no regional wall motion   abnormalities. Features are mostly consistent with a pseudonormal   left ventricular filling pattern, with concomitant abnormal   relaxation and increased filling pressure (grade 2 diastolic   dysfunction). - Left atrium: The atrium was mildly dilated.  Vascular Studies  LE Venous Dopplers 09/21/16 Summary:  - Findings consistent with indeterminate age deep vein thrombosis   involving the left peroneal vein. - All other veins are patent without evidence of deep or   superficial vein thrombosis.  Patient Profile     66 year old male with paroxysmal atrial fibrillation (PRN Pill-in-pocket Flecainide) w/ CHADSVASC score of 1 (will be 2 next January when he turns 77) admitted for atypical chest pain with 3  negative troponin's and nonischemic EKG. D-dimer was mildly elevated. Chest CT ordered to exclude PE given his extensive air travel. CT of chest negative for PE, however LE venous dopplers confirmed DVT.   Assessment & Plan    1. Chest Pain: atypical CP not c/w ischemic CP. He has ruled out for MI with negative CEs and chest CT also excluded PE. EKG is nonischemic. No indication for further inpatient cardiac w/u. His chest CT however did show conary artery atherosclerosis in the LAD, circumflex and RCA. Outpatient NST has been recommended. We will arrange.   2. DVT: LE venous dopplers 09/21/16 showed findings c/w indeterminate age deep vein thrombosis involving the left peroneal vein. All other veins are patent without evidence of deep or superficial vein thrombosis. Pt is a frequent international traveler, which  likely provoked his DVT. Chest CT is negative for PE. He has been started on Xarelto. He will need 15 mg BID x 3 weeks, followed by 20 mg daily. 325 mg of ASA also ordered. Recommend discontinuation of high dose ASA to reduce bleed risk. Will discuss with MD continuation of low dose 81 mg, along with Xarelto, given CT finding of 3V atherosclerosis.   3. PAF: uses PRN flecainide (pill-in-pocket approach for breakthrough afib). CHA2DS2 VASc score currently 1 however will be 2 once he turns 67 in 03/2017. Regardless, he is now on Xarelto for a/c given DVT. If he tolerates Xarelto and can afford cost, can continue for stroke prophylaxis once he completes treatment course for his current active DVT.     Signed, Lyda Jester, PA-C  09/22/2016, 11:23 AM

## 2016-09-22 NOTE — Plan of Care (Signed)
Problem: Safety: Goal: Ability to remain free from injury will improve Outcome: Progressing Steady gait observed.  Fall risk low.  Patient verbalized understanding of calling for assistance when needed.  Patient progressing towards goal.

## 2016-09-22 NOTE — Progress Notes (Signed)
Discharge instructions printed and reviewed with patient and spouse, and copy given for them to take home. All questions addressed at this time. New prescriptions reviewed, and discount card for xarelto given. IV's removed. Room searched for patient belongings, and confirmed with patient that all valuables were accounted for. Spouse assisted patient to dress. Staff to escort patient to discharge via wheelchair, travelling home with wife in private vehicle.

## 2016-09-23 ENCOUNTER — Telehealth: Payer: Self-pay | Admitting: Interventional Cardiology

## 2016-09-23 DIAGNOSIS — I480 Paroxysmal atrial fibrillation: Secondary | ICD-10-CM

## 2016-09-23 MED ORDER — FLECAINIDE ACETATE 100 MG PO TABS: ORAL_TABLET | ORAL | 6 refills | Status: DC

## 2016-09-23 NOTE — Discharge Summary (Signed)
Physician Discharge Summary  Carlos Gutierrez:[id number] DOB: 1950-04-11 DOA: 09/21/2016  PCP: London Pepper, MD  Admit date: 09/21/2016 Discharge date: 09/22/2016  Admitted From: Home,  Dispositiion: Home.   Recommendations for Outpatient Follow-up:  1. Follow up with PCP in less than a weeks 2. Please obtain BMP/CBC in one week 3. Please follow up with cardiology as recommended for stress test as outpateint.     Discharge Condition: stable . ) CODE STATUS: (FULL) Diet recommendation: Heart Healthy   Brief/Interim Summary: 66 year old male with paroxysmal atrial fibrillation (PRN Pill-in-pocket Flecainide) w/ CHADSVASC score of 1 (will be 2 next January when he turns 70) admitted for atypical chest pain with 3 negative troponin's and nonischemic EKG. D-dimer was mildly elevated. Chest CT ordered to exclude PE given his extensive air travel. CT of chest negative for PE, however LE venous dopplers confirmed DVT.   Discharge Diagnoses:  Principal Problem:   Chest pain with moderate risk for cardiac etiology Active Problems:   Atrial fibrillation (HCC)   Obstructive sleep apnea on CPAP   HTN (hypertension)   Obesity (BMI 30-39.9)   Insomnia   Atrial flutter (HCC)   Chest pain   Pulmonary embolus (HCC)  CHEST PAIN;  - atypical, non exertional.  -cardiology consulted and recommendations given. Serial troponins negative.   DVT:  Started on anticoagulation, and ttransitioned oral xarelto.   Hypertension; well controlled.    PAF;  On xarelto. Rate controlled.   Obesity:  Lifestyle modification.  Discharge Instructions  Discharge Instructions    Diet - low sodium heart healthy    Complete by:  As directed    Discharge instructions    Complete by:  As directed    Please follow up with cardiology as recommended for outpatient stress test.  Please follow up with PCP by the end of the week, post hospitalization visit, before going back to work and travelling.      Allergies as of 09/22/2016   No Known Allergies     Medication List    STOP taking these medications   aspirin 325 MG tablet Replaced by:  aspirin EC 81 MG tablet     TAKE these medications   aspirin EC 81 MG tablet Take 1 tablet (81 mg total) by mouth daily. Replaces:  aspirin 325 MG tablet   atorvastatin 20 MG tablet Commonly known as:  LIPITOR Take 20 mg by mouth daily with supper.   cetirizine 10 MG tablet Commonly known as:  ZYRTEC Take 10 mg by mouth at bedtime as needed for allergies.   Eszopiclone 3 MG Tabs Take 1.5-3 mg by mouth at bedtime as needed (sleep). Take immediately before bedtime   flecainide 100 MG tablet Commonly known as:  TAMBOCOR Take two tablets (200 mg) as directed What changed:  how much to take  how to take this  when to take this  reasons to take this  additional instructions   ibuprofen 200 MG tablet Commonly known as:  ADVIL,MOTRIN Take 200 mg by mouth every 6 (six) hours as needed for headache or moderate pain.   loratadine 10 MG tablet Commonly known as:  CLARITIN Take 10 mg by mouth daily as needed for allergies.   multivitamin with minerals tablet Take 1 tablet by mouth daily.   olmesartan-hydrochlorothiazide 20-12.5 MG tablet Commonly known as:  BENICAR HCT Take 1 tablet by mouth daily.   pantoprazole 40 MG tablet Commonly known as:  PROTONIX Take 1 tablet (40 mg total) by mouth 2 (two)  times daily.   Rivaroxaban 15 & 20 MG Tbpk Take as directed on package: Start with one 15mg  tablet by mouth twice a day with food. On Day 22, switch to one 20mg  tablet once a day with food.      Follow-up Information    London Pepper, MD. Schedule an appointment as soon as possible for a visit on 09/24/2016.   Specialty:  Family Medicine Why:  for follow up., prior to getting back to work and travel.  Contact information: [place] [phone]        [facility] [employer]. Go on 10/11/2016.   Specialty:  Cardiology Why:  @ 8:00 with [place] information: [address], [place] [phone]         No Known Allergies  Consultations: Cardiology.   Procedures/Studies: Dg Chest 2 View  Result Date: 09/21/2016 CLINICAL DATA:  66 year old male with chest pain. EXAM: CHEST  2 VIEW COMPARISON:  Chest radiograph dated 08/25/2014 FINDINGS: Bibasilar streaky atelectatic changes. No focal consolidation, pleural effusion, or pneumothorax. The cardiac silhouette is within normal limits. No acute osseous pathology. IMPRESSION: No active cardiopulmonary disease. Electronically Signed   By: Anner Crete M.D.   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 Both joints demonstrate bony spurring, sclerosis, and joint space loss. Mild deformity of the humeral head noted. No acute osseous finding, fracture or malalignment. IMPRESSION: Right shoulder moderate degenerative change of the [facility] joint and glenohumeral joint. No acute osseous finding Electronically Signed   By: Jerilynn Mages.  Shick M.D.   On: 09/22/2016 09:12   Ct Angio Chest Pe W Or Wo Contrast  Result Date: 09/21/2016 CLINICAL DATA:  Chest pain EXAM: CT ANGIOGRAPHY CHEST WITH CONTRAST TECHNIQUE: Multidetector CT imaging of the chest was performed using the standard protocol during bolus administration of intravenous contrast. Multiplanar CT image reconstructions and MIPs were obtained to evaluate the vascular anatomy. CONTRAST:  100 mL Isovue 370 COMPARISON:  None. FINDINGS: Cardiovascular: Satisfactory opacification of the pulmonary arteries to the segmental level. No evidence of pulmonary embolism. Normal heart size. No pericardial effusion.  Thoracic aorta is normal in caliber. No thoracic aortic dissection. 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 Mediastinum/Nodes: No enlarged mediastinal, hilar, or axillary lymph nodes. Thyroid gland, trachea, and esophagus demonstrate no significant findings. Lungs/Pleura: Lungs are clear. No pleural effusion or pneumothorax. Upper Abdomen: No acute abnormality. Musculoskeletal: No chest wall abnormality. No acute or significant osseous findings. Review of the MIP images confirms the above findings. IMPRESSION: 1. No evidence of pulmonary embolus. 2. No thoracic aortic aneurysm or dissection. Electronically Signed   By: Kathreen Devoid   On: 09/21/2016 11:39    (Echo, Carotid, EGD, Colonoscopy, ERCP)    Subjective:   Discharge Exam: Vitals:   09/22/16 0633 09/22/16 1144  BP: (!) 141/81 124/69  Pulse: 61 (!) 52  Resp: 20 20  Temp: 97.7 F (36.5 C) 97.8 F (36.6 C)   Vitals:   09/21/16 2015 09/22/16 0052 09/22/16 0633 09/22/16 1144  BP: 126/67 138/87 (!) 141/81 124/69  Pulse: (!) 54 (!) 58 61 (!) 52  Resp: 20 20 20 20   Temp: 98.5 F (36.9 C) 98.1 F (36.7 C) 97.7 F (  36.5 C) 97.8 F (36.6 C)  TempSrc: Oral Oral Oral Oral  SpO2: 98% 98% 98% 94%  Weight:   107.4 kg (236 lb 11.2 oz)   Height:        General: Pt is alert, awake, not in acute distress Cardiovascular: RRR, S1/S2 +, no rubs, no gallops Respiratory: CTA bilaterally, no wheezing, no rhonchi Abdominal: Soft, NT, ND, bowel sounds + Extremities: no edema, no cyanosis    The results of significant diagnostics from this hospitalization (including imaging, microbiology, ancillary and laboratory) are listed below for reference.     Microbiology: No results found for this or any previous visit (from the past 240 hour(s)).   Labs: BNP (last 3 results) No results for input(s): BNP in the last 8760 hours. Basic Metabolic Panel:  Recent Labs Lab 09/21/16 0229  NA 136  K 3.8  CL 104  CO2 22   GLUCOSE 185*  BUN 20  CREATININE 1.14  CALCIUM 9.6  MG 2.0   Liver Function Tests: No results for input(s): AST, ALT, ALKPHOS, BILITOT, PROT, ALBUMIN in the last 168 hours. No results for input(s): LIPASE, AMYLASE in the last 168 hours. No results for input(s): AMMONIA in the last 168 hours. CBC:  Recent Labs Lab 09/21/16 0229  WBC 10.4  HGB 15.7  HCT 45.9  MCV 90.4  PLT 177   Cardiac Enzymes:  Recent Labs Lab 09/21/16 0628 09/21/16 1152 09/21/16 1758  TROPONINI <0.03 <0.03 <0.03   BNP: Invalid input(s): POCBNP CBG:  Recent Labs Lab 09/21/16 1216 09/21/16 2044 09/22/16 0738 09/22/16 1131  GLUCAP 110* 138* 138* 135*   D-Dimer  Recent Labs  09/21/16 0725  DDIMER 0.57*   Hgb A1c No results for input(s): HGBA1C in the last 72 hours. Lipid Profile No results for input(s): CHOL, HDL, LDLCALC, TRIG, CHOLHDL, LDLDIRECT in the last 72 hours. Thyroid function studies No results for input(s): TSH, T4TOTAL, T3FREE, THYROIDAB in the last 72 hours.  Invalid input(s): FREET3 Anemia work up No results for input(s): VITAMINB12, FOLATE, FERRITIN, TIBC, IRON, RETICCTPCT in the last 72 hours. Urinalysis No results found for: COLORURINE, APPEARANCEUR, LABSPEC, Makena, GLUCOSEU, HGBUR, BILIRUBINUR, KETONESUR, PROTEINUR, UROBILINOGEN, NITRITE, LEUKOCYTESUR Sepsis Labs Invalid input(s): PROCALCITONIN,  WBC,  LACTICIDVEN Microbiology No results found for this or any previous visit (from the past 240 hour(s)).   Time coordinating discharge: Over 30 minutes  SIGNED:   Hosie Poisson, MD  [facility] 09/23/2016, 10:27 AM Pager   If 7PM-7AM, please contact night-coverage [email or web address] Password [email or web address]

## 2016-09-23 NOTE — Telephone Encounter (Signed)
New Message  Pt c/o medication issue:  1. Name of Medication: Flecainide   2. How are you currently taking this medication (dosage and times per day)? 100mg    3. Are you having a reaction (difficulty breathing--STAT)? no  4. What is your medication issue? Per pt states he his been taking the medication as needed. Pt states the direction read morning daily. Pt would like to speak with RN. Please call back to discuss

## 2016-09-23 NOTE — Telephone Encounter (Signed)
Returning call to patient. Patient states that prior to his hospital admission he had been taking flecainide 200 mg daily AS NEEDED for Afib. He states that on his discharge instructions it states to take this daily. Patient asking if how he should be taking this. Per Note from [place], [place] who last saw the patient while in the hospital:  PAF: uses PRN flecainide (pill-in-pocket approach for breakthrough afib). CHA2DS2 VASc score currently 1 however will be 2 once he turns 67 in 03/2017. Regardless, he is now on Xarelto for a/c given DVT. If he tolerates Xarelto and can afford cost, can continue for stroke prophylaxis once he completes treatment course for his current active DVT.   Advised for patient to continue using Flecainide PRN and keep appointment with Lyda Jester, PA on 7/30. Rx in Epic stated to take as directed with no instruction. Rx updated to instruct patient to take daily prn for Afib. Will route to [employer], [place] for review.

## 2016-09-27 DIAGNOSIS — R7301 Impaired fasting glucose: Secondary | ICD-10-CM | POA: Diagnosis not present

## 2016-09-27 DIAGNOSIS — R079 Chest pain, unspecified: Secondary | ICD-10-CM | POA: Diagnosis not present

## 2016-09-27 DIAGNOSIS — Z09 Encounter for follow-up examination after completed treatment for conditions other than malignant neoplasm: Secondary | ICD-10-CM | POA: Diagnosis not present

## 2016-09-27 DIAGNOSIS — I4891 Unspecified atrial fibrillation: Secondary | ICD-10-CM | POA: Diagnosis not present

## 2016-10-05 ENCOUNTER — Encounter: Payer: Self-pay | Admitting: Cardiology

## 2016-10-05 ENCOUNTER — Telehealth: Payer: Self-pay | Admitting: Interventional Cardiology

## 2016-10-05 NOTE — Telephone Encounter (Signed)
New Message  Pt call requesting to speak with RN. Pt states he was recently diagnosed with a blood clot in left leg. Pt would like to know if should be concerned. Please call back to discuss

## 2016-10-05 NOTE — Telephone Encounter (Signed)
Pt diagnosed with blood clot in left leg at hospital.  Was started on Xarelto for tx.  States left ankle was swollen prior to finding out about the clot and continues to swell up throughout the day.  Improves with ice and elevation.  Denies pain to leg but states has slight soreness with touch.  Advised pt to continue to elevate to improve swelling and make sure to take Xarelto.   Will route to Dr. Tamala Julian to see if further recommendations.  Pt appreciative for call back. Pt is scheduled for hospital f/u on 10/11/16 with Ellen Henri, PA-C.

## 2016-10-06 NOTE — Telephone Encounter (Signed)
No worries as long as he take anticoagulant.

## 2016-10-11 ENCOUNTER — Encounter: Payer: Self-pay | Admitting: Cardiology

## 2016-10-11 ENCOUNTER — Ambulatory Visit (INDEPENDENT_AMBULATORY_CARE_PROVIDER_SITE_OTHER): Payer: BLUE CROSS/BLUE SHIELD | Admitting: Cardiology

## 2016-10-11 VITALS — BP 130/70 | HR 60 | Ht 73.0 in | Wt 234.8 lb

## 2016-10-11 DIAGNOSIS — H00024 Hordeolum internum left upper eyelid: Secondary | ICD-10-CM | POA: Diagnosis not present

## 2016-10-11 DIAGNOSIS — K219 Gastro-esophageal reflux disease without esophagitis: Secondary | ICD-10-CM | POA: Diagnosis not present

## 2016-10-11 DIAGNOSIS — I480 Paroxysmal atrial fibrillation: Secondary | ICD-10-CM

## 2016-10-11 DIAGNOSIS — R079 Chest pain, unspecified: Secondary | ICD-10-CM | POA: Diagnosis not present

## 2016-10-11 MED ORDER — RIVAROXABAN 20 MG PO TABS: 20.0000 mg | ORAL_TABLET | Freq: Every day | ORAL | 11 refills | Status: DC

## 2016-10-11 MED ORDER — PANTOPRAZOLE SODIUM 40 MG PO TBEC: 40.0000 mg | DELAYED_RELEASE_TABLET | Freq: Two times a day (BID) | ORAL | 11 refills | Status: DC

## 2016-10-11 NOTE — Progress Notes (Signed)
10/11/2016 Carlos Gutierrez   Jun 29, 1950  [id number]  Primary Physician London Pepper, MD Primary Cardiologist: Dr. Tamala Julian EP: Dr. Lovena Le    Reason for Visit/CC: [facility] F/u for DVT  HPI:  Carlos Gutierrez is a 66 y.o. male who is being seen today for post hospital f/u.   In summary, he is a 66 year old male with paroxysmal atrial fibrillation (PRN Pill-in-pocket Flecainide) w/ CHADSVASC score of 1 (will be 2 next January when he turns 51) recently admitted for atypical chest pain with 3 negative troponin's and nonischemic EKG. D-dimer was mildly elevated. Chest CT ordered to exclude PE given his extensive air travel. CT of chest negative for PE, however LE venous dopplers confirmed DVT. He was started on Xarelto, 15 mg BID x 3 weeks, followed by 20 mg daily thereafter. Dr. Marlou Porch recommended at time of discharge to plan on outpatient NST in 2-3 months, after adequate treatment for DVT.   He presents to clinic today for post hospital f/u. He reports he has done well. No leg pain or swelling. No dyspnea or recurrent CP. He reports full compliance with Xarelto. No abnormal bleeding. No melena or hematochezia. EKG shows NSR. BP and HR both well controlled.   Current Meds  Medication Sig  . aspirin EC 81 MG tablet Take 1 tablet (81 mg total) by mouth daily.  [name] atorvastatin (LIPITOR) 20 MG tablet Take 20 mg by mouth daily with supper.   . cetirizine (ZYRTEC) 10 MG tablet Take 10 mg by mouth at bedtime as needed for allergies.  . Eszopiclone 3 MG TABS Take 1.5-3 mg by mouth at bedtime as needed (sleep). Take immediately before bedtime   . flecainide (TAMBOCOR) 100 MG tablet Take two tablets (200 mg) by mouth daily as needed for Afib  . ibuprofen (ADVIL,MOTRIN) 200 MG tablet Take 200 mg by mouth every 6 (six) hours as needed for headache or moderate pain.  [name] loratadine (CLARITIN) 10 MG tablet Take 10 mg by mouth daily as needed for allergies.  . Multiple Vitamins-Minerals (MULTIVITAMIN WITH  MINERALS) tablet Take 1 tablet by mouth daily.    [name] olmesartan-hydrochlorothiazide (BENICAR HCT) 20-12.5 MG tablet Take 1 tablet by mouth daily.  . pantoprazole (PROTONIX) 40 MG tablet Take 1 tablet (40 mg total) by mouth 2 (two) times daily.  . [DISCONTINUED] pantoprazole (PROTONIX) 40 MG tablet Take 1 tablet (40 mg total) by mouth 2 (two) times daily.  . [DISCONTINUED] Rivaroxaban 15 & 20 MG TBPK Take as directed on package: Start with one 15mg  tablet by mouth twice a day with food. On Day 22, switch to one 20mg  tablet once a day with food.   No Known Allergies Past Medical History:  Diagnosis Date  . Atrial fibrillation ([place]) 2012   Rare occurrences. On ASA & Flecainide prn  . Atrial flutter ([place])    s/p ablation 2008. On Flecainide prn.  . Broken leg 1968  . Concussion 1966   History of 4  . Dyslipidemia   . Essential hypertension, benign   . Low back pain   . Obesity (BMI 30-39.9)   . Obstructive sleep apnea on CPAP    Mild  . PAF (paroxysmal atrial fibrillation) ([place])    Rare occurrences. On ASA & Flecainide prn  . Pure hypercholesterolemia    On Lipitor   . Right shoulder pain    Intermittent  . Status post ablation of atrial flutter 2008   Family History  Problem Relation Age of Onset  .  Hypertension Mother 90  . Diabetes Mother   . Heart disease Father 35  . Hypertension Father   . Heart failure Father   . Heart attack Father   . Diabetes Brother 6  . Hypertension Brother 35  . Heart attack Paternal Grandfather   . Stroke Neg Hx    Past Surgical History:  Procedure Laterality Date  . ablation for atrial flutter  01/2002  . CARDIAC CATHETERIZATION  12/26/01  . COLONOSCOPY WITH PROPOFOL N/A 03/12/2016   Procedure: COLONOSCOPY WITH PROPOFOL;  Surgeon: [name], MD;  Location: [facility];  Service: Endoscopy;  Laterality: N/A;  . coronary angiography  1997   40% stenosis  . right knee surgery  1996   ACL, meniscus problems  . UMBILICAL HERNIA REPAIR   04/2015   Social History   Social History  . Marital status: Married    Spouse name: N/A  . Number of children: 2  . Years of education: N/A   Occupational History  . product manager Other   Social History Main Topics  . Smoking status: Never Smoker  . Smokeless tobacco: Never Used  . Alcohol use 4.2 - 6.0 oz/week    7 - 10 Glasses of wine per week  . Drug use: No  . Sexual activity: Not on file   Other Topics Concern  . Not on file   Social History Narrative   Caffeine: yes, coffee 2-3 cups daily, tea-rare.    Exercise-yes, 2-3X weekly, jog/run.    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    Marital Status: Married.    Children: 2 children-grown                 Review of Systems: General: negative for chills, fever, night sweats or weight changes.  Cardiovascular: negative for chest pain, dyspnea on exertion, edema, orthopnea, palpitations, paroxysmal nocturnal dyspnea or shortness of breath Dermatological: negative for rash Respiratory: negative for cough or wheezing Urologic: negative for hematuria Abdominal: negative for nausea, vomiting, diarrhea, bright red blood per rectum, melena, or hematemesis Neurologic: negative for visual changes, syncope, or dizziness All other systems reviewed and are otherwise negative except as noted above.   Physical Exam:  Blood pressure 130/70, pulse 60, height 6\' 1"  (1.854 m), weight 234 lb 12.8 oz (106.5 kg), SpO2 98 %.  General appearance: alert, cooperative and no distress Neck: no carotid bruit and no JVD Lungs: clear to auscultation bilaterally Heart: regular rate and rhythm, S1, S2 normal, no murmur, click, rub or gallop Extremities: extremities normal, atraumatic, no cyanosis or edema Pulses: 2+ and symmetric Skin: Skin color, texture, turgor normal. No rashes or lesions Neurologic: Grossly normal  EKG SR with 1st degree AVB 60 bpm -- personally  reviewed   ASSESSMENT AND PLAN:   1. DVT: recently diagnosed. Chest CT negative for PE. DVT likely provoked by immobility 2/2 travel (frequent flyer, extensive air travel for work). He is on a/c with Xarelto and tolerating well w/o side effects. Placed on Xarelto 15 mg BID x 3 weeks and will transition to 20 mg daily thereafter.   2. PAF: uses PRN Pill-in-pocket Flecainide. Denies any breakthrough symptoms. EKG shows NSR. HR 60 bpm. CHADSVASC score of 1 (will be 2 next January when he turns 48), however Xarelto initiated for DVT.   3. Atypical Chest Pain: recent chest CT was negative for PE. Cardiac enzymes were negative. Pt seen in hospital by Dr. Marlou Porch, who recommended outpatient NST in  2-3 months, after adequate treatment for DVT. He denies any recurrent CP.   4. OSA: reports full compliance with CPAP. Followed by Dr. Radford Pax.    Follow-Up with Dr. Tamala Julian in 3 months.   Sharlisa Hollifield Ladoris Gene, MHS [facility] 10/11/2016 8:32 AM

## 2016-10-11 NOTE — Patient Instructions (Signed)
Medication Instructions:  Your physician recommends that you continue on your current medications as directed. Please refer to the Current Medication list given to you today.   Labwork: None ordered  Testing/Procedures: None ordered  Follow-Up: Your physician wants you to follow-up in: 3 months with Dr. Tamala Julian ( if Dr. Tamala Julian not available can follow up with Richardson Dopp or Lyda Jester)   Any Other Special Instructions Will Be Listed Below (If Applicable).     If you need a refill on your cardiac medications before your next appointment, please call your pharmacy.

## 2016-10-13 ENCOUNTER — Other Ambulatory Visit: Payer: Self-pay | Admitting: Gastroenterology

## 2016-10-15 ENCOUNTER — Encounter (HOSPITAL_COMMUNITY)
Admission: RE | Disposition: A | Discharge status: Home / Self Care | Payer: Self-pay | Source: Ambulatory Visit | Attending: Gastroenterology

## 2016-10-15 ENCOUNTER — Ambulatory Visit (HOSPITAL_COMMUNITY): Payer: BLUE CROSS/BLUE SHIELD | Admitting: Anesthesiology

## 2016-10-15 ENCOUNTER — Encounter (HOSPITAL_COMMUNITY): Payer: Self-pay | Admitting: Gastroenterology

## 2016-10-15 ENCOUNTER — Ambulatory Visit (HOSPITAL_COMMUNITY)
Admission: RE | Admit: 2016-10-15 | Discharge: 2016-10-15 | Disposition: A | Discharge status: Home / Self Care | Payer: BLUE CROSS/BLUE SHIELD | Source: Ambulatory Visit | Attending: Gastroenterology | Admitting: Gastroenterology

## 2016-10-15 DIAGNOSIS — Z6830 Body mass index (BMI) 30.0-30.9, adult: Secondary | ICD-10-CM | POA: Insufficient documentation

## 2016-10-15 DIAGNOSIS — Z86718 Personal history of other venous thrombosis and embolism: Secondary | ICD-10-CM | POA: Diagnosis not present

## 2016-10-15 DIAGNOSIS — G4730 Sleep apnea, unspecified: Secondary | ICD-10-CM | POA: Insufficient documentation

## 2016-10-15 DIAGNOSIS — Z79899 Other long term (current) drug therapy: Secondary | ICD-10-CM | POA: Diagnosis not present

## 2016-10-15 DIAGNOSIS — I480 Paroxysmal atrial fibrillation: Secondary | ICD-10-CM | POA: Diagnosis not present

## 2016-10-15 DIAGNOSIS — I10 Essential (primary) hypertension: Secondary | ICD-10-CM | POA: Diagnosis not present

## 2016-10-15 DIAGNOSIS — I4892 Unspecified atrial flutter: Secondary | ICD-10-CM | POA: Diagnosis not present

## 2016-10-15 DIAGNOSIS — E669 Obesity, unspecified: Secondary | ICD-10-CM | POA: Insufficient documentation

## 2016-10-15 DIAGNOSIS — R0789 Other chest pain: Secondary | ICD-10-CM | POA: Diagnosis not present

## 2016-10-15 DIAGNOSIS — R079 Chest pain, unspecified: Secondary | ICD-10-CM | POA: Diagnosis not present

## 2016-10-15 DIAGNOSIS — E7800 Pure hypercholesterolemia, unspecified: Secondary | ICD-10-CM | POA: Diagnosis not present

## 2016-10-15 DIAGNOSIS — G4733 Obstructive sleep apnea (adult) (pediatric): Secondary | ICD-10-CM | POA: Insufficient documentation

## 2016-10-15 DIAGNOSIS — I4891 Unspecified atrial fibrillation: Secondary | ICD-10-CM | POA: Diagnosis not present

## 2016-10-15 DIAGNOSIS — K219 Gastro-esophageal reflux disease without esophagitis: Secondary | ICD-10-CM | POA: Diagnosis not present

## 2016-10-15 HISTORY — PX: ESOPHAGOGASTRODUODENOSCOPY (EGD) WITH PROPOFOL: SHX5813

## 2016-10-15 SURGERY — ESOPHAGOGASTRODUODENOSCOPY (EGD) WITH PROPOFOL
Anesthesia: Monitor Anesthesia Care

## 2016-10-15 MED ORDER — SODIUM CHLORIDE 0.9 % IV SOLN: INTRAVENOUS | Status: DC

## 2016-10-15 MED ORDER — LACTATED RINGERS IV SOLN
INTRAVENOUS | Status: DC
  Administered 2016-10-15: 10:00:00 via INTRAVENOUS

## 2016-10-15 MED ORDER — PROPOFOL 10 MG/ML IV BOLUS
INTRAVENOUS | Status: AC
  Filled 2016-10-15: qty 20

## 2016-10-15 MED ORDER — PROPOFOL 10 MG/ML IV BOLUS
INTRAVENOUS | Status: DC | PRN
  Administered 2016-10-15 (×3): 20 mg via INTRAVENOUS
  Administered 2016-10-15: 40 mg via INTRAVENOUS
  Administered 2016-10-15: 20 mg via INTRAVENOUS

## 2016-10-15 SURGICAL SUPPLY — 15 items

## 2016-10-15 NOTE — Discharge Instructions (Signed)
Esophagogastroduodenoscopy, Care After °Refer to this sheet in the next few weeks. These instructions provide you with information about caring for yourself after your procedure. Your health care provider may also give you more specific instructions. Your treatment has been planned according to current medical practices, but problems sometimes occur. Call your health care provider if you have any problems or questions after your procedure. °What can I expect after the procedure? °After the procedure, it is common to have: °· A sore throat. °· Nausea. °· Bloating. °· Dizziness. °· Fatigue. ° °Follow these instructions at home: °· Do not eat or drink anything until the numbing medicine (local anesthetic) has worn off and your gag reflex has returned. You will know that the local anesthetic has worn off when you can swallow comfortably. °· Do not drive for 24 hours if you received a medicine to help you relax (sedative). °· If your health care provider took a tissue sample for testing during the procedure, make sure to get your test results. This is your responsibility. Ask your health care provider or the department performing the test when your results will be ready. °· Keep all follow-up visits as told by your health care provider. This is important. °Contact a health care provider if: °· You cannot stop coughing. °· You are not urinating. °· You are urinating less than usual. °Get help right away if: °· You have trouble swallowing. °· You cannot eat or drink. °· You have throat or chest pain that gets worse. °· You are dizzy or light-headed. °· You faint. °· You have nausea or vomiting. °· You have chills. °· You have a fever. °· You have severe abdominal pain. °· You have black, tarry, or bloody stools. °This information is not intended to replace advice given to you by your health care provider. Make sure you discuss any questions you have with your health care provider. °Document Released: 02/16/2012 Document  Revised: 08/07/2015 Document Reviewed: 01/23/2015 °Elsevier Interactive Patient Education © 2018 Elsevier Inc. ° °

## 2016-10-15 NOTE — Op Note (Signed)
[facility] Patient Name: Carlos Gutierrez Procedure Date: 10/15/2016 MRN: [id number] Attending MD: [name] , MD Date of Birth: 1950-06-13 CSN: [id number] Age: 66 Admit Type: Outpatient Procedure:                Upper GI endoscopy Indications:              Chest pain (non cardiac) Providers:                [name], MD, Laverta Baltimore RN, RN, Alan Mulder, Technician Referring MD:              Medicines:                Propofol per Anesthesia Complications:            No immediate complications. Estimated Blood Loss:     Estimated blood loss: none. Procedure:                Pre-Anesthesia Assessment:                           - Prior to the procedure, a History and Physical                            was performed, and patient medications and                            allergies were reviewed. The patient's tolerance of                            previous anesthesia was also reviewed. The risks                            and benefits of the procedure and the sedation                            options and risks were discussed with the patient.                            All questions were answered, and informed consent                            was obtained. Prior Anticoagulants: The patient has                            taken no previous anticoagulant or antiplatelet                            agents. ASA Grade Assessment: II - A patient with                            mild systemic disease. After reviewing the risks  and benefits, the patient was deemed in                            satisfactory condition to undergo the procedure.                           - Sedation was administered by an anesthesia                            professional. Deep sedation was attained.                           After obtaining informed consent, the endoscope was                            passed under direct vision.  Throughout the                            procedure, the patient's blood pressure, pulse, and                            oxygen saturations were monitored continuously. The                            EG-[id number] ([id number]) scope was introduced through the                            mouth, and advanced to the second part of duodenum.                            The upper GI endoscopy was accomplished without                            difficulty. The patient tolerated the procedure                            well. Scope In: Scope Out: Findings:      The esophagus was normal. There was some refluxate in the esophagus upon       initial entry.      The stomach was normal.      The examined duodenum was normal. Impression:               - Normal esophagus.                           - Normal stomach.                           - Normal examined duodenum.                           - No specimens collected. Moderate Sedation:      N/A- Per Anesthesia Care Recommendation:           - Patient has a contact number available for  emergencies. The signs and symptoms of potential                            delayed complications were discussed with the                            patient. Return to normal activities tomorrow.                            Written discharge instructions were provided to the                            patient.                           - Resume previous diet.                           - Continue present medications.                           - No repeat upper endoscopy. Procedure Code(s):        --- Professional ---                           [id number], Esophagogastroduodenoscopy, flexible,                            transoral; diagnostic, including collection of                            specimen(s) by brushing or washing, when performed                            (separate procedure) Diagnosis Code(s):        --- Professional ---                            R07.89, Other chest pain CPT copyright 2016 American Medical Association. All rights reserved. The codes documented in this report are preliminary and upon coder review may  be revised to meet current compliance requirements. [name], MD [name], MD 10/15/2016 10:25:07 AM This report has been signed electronically. Number of Addenda: 0

## 2016-10-15 NOTE — Anesthesia Postprocedure Evaluation (Signed)
Anesthesia Post Note  Patient: Carlos Gutierrez  Procedure(s) Performed: Procedure(s) (LRB): ESOPHAGOGASTRODUODENOSCOPY (EGD) WITH PROPOFOL (N/A)     Patient location during evaluation: Endoscopy Anesthesia Type: MAC Level of consciousness: awake and alert Pain management: pain level controlled Vital Signs Assessment: post-procedure vital signs reviewed and stable Respiratory status: spontaneous breathing, nonlabored ventilation, respiratory function stable and patient connected to nasal cannula oxygen Cardiovascular status: stable and blood pressure returned to baseline Anesthetic complications: no    Last Vitals:  Vitals:   10/15/16 1024 10/15/16 1030  BP: 98/62 (!) 102/58  Pulse: (!) 55 (!) 48  Resp: 16 14  Temp:      Last Pain:  Vitals:   10/15/16 0945  TempSrc: Oral                 [name]

## 2016-10-15 NOTE — H&P (Signed)
Carlos Gutierrez HPI: On July 11th, at 1:30 AM, he presented with chest pain. He was identified to have a left lower extremity DVT and he was negative for a PE with imaging. Xarelto was initiated. He has a history of international traveling and it was felt that this was the cause for his DVT. He ruled out for a MI and he was started on pantoprazole. With the PPI he does not feel any different. GERD is not a regular issue for him. In 2015 he had a similar episode with chest pain and he was started on a PPI x 1 month without any significant change.   Past Medical History:  Diagnosis Date  . Atrial fibrillation ([place]) 2012   Rare occurrences. On ASA & Flecainide prn  . Atrial flutter ([place])    s/p ablation 2008. On Flecainide prn.  . Broken leg 1968  . Concussion 1966   History of 4  . Dyslipidemia   . Essential hypertension, benign   . Low back pain   . Obesity (BMI 30-39.9)   . Obstructive sleep apnea on CPAP    Mild  . PAF (paroxysmal atrial fibrillation) ([place])    Rare occurrences. On ASA & Flecainide prn  . Pure hypercholesterolemia    On Lipitor   . Right shoulder pain    Intermittent  . Status post ablation of atrial flutter 2008    Past Surgical History:  Procedure Laterality Date  . ablation for atrial flutter  01/2002  . CARDIAC CATHETERIZATION  12/26/01  . COLONOSCOPY WITH PROPOFOL N/A 03/12/2016   Procedure: COLONOSCOPY WITH PROPOFOL;  Surgeon: [name], MD;  Location: [facility];  Service: Endoscopy;  Laterality: N/A;  . coronary angiography  1997   40% stenosis  . right knee surgery  1996   ACL, meniscus problems  . UMBILICAL HERNIA REPAIR  04/2015    Family History  Problem Relation Age of Onset  . Hypertension Mother 39  . Diabetes Mother   . Heart disease Father 41  . Hypertension Father   . Heart failure Father   . Heart attack Father   . Diabetes Brother 61  . Hypertension Brother 39  . Heart attack Paternal Grandfather   . Stroke Neg Hx      Social History:  reports that he has never smoked. He has never used smokeless tobacco. He reports that he drinks about 4.2 - 6.0 oz of alcohol per week . He reports that he does not use drugs.  Allergies: No Known Allergies  Medications:  Scheduled:  Continuous: . lactated ringers 20 mL/hr at 10/15/16 0950    No results found for this or any previous visit (from the past 24 hour(s)).   No results found.  ROS:  As stated above in the HPI otherwise negative.  Blood pressure 128/77, pulse (!) 56, temperature 97.6 F (36.4 C), temperature source Oral, resp. rate 19, height 6\' 1"  (1.854 m), weight 106.1 kg (234 lb), SpO2 96 %.    PE: Gen: NAD, Alert and Oriented HEENT:  [place]/AT, EOMI Neck: Supple, no LAD Lungs: CTA Bilaterally CV: RRR without M/G/R ABM: Soft, NTND, +BS Ext: No C/C/E  Assessment/Plan: 1) Noncardiac chest pain - The patient has noncardiac chest pain. He was negative for any evidence of a PE. It is reasonable to perform an EGD. Even though he travels frequently on flights, he typically flies in first class where he can stretch out. He is also very diligent, before his PE, to move and  stretch his legs. His chest pain can be as a result of GERD, but he has not demonstrated any significant difference with using pantoprazole. This is also his second noncardiac chest pain event that he recalls without any overt benefit with a PPI.     Rubye Strohmeyer D 10/15/2016, 9:54 AM

## 2016-10-15 NOTE — Anesthesia Preprocedure Evaluation (Signed)
Anesthesia Evaluation  Patient identified by MRN, date of birth, ID band Patient awake    Reviewed: Allergy & Precautions, NPO status , Patient's Chart, lab work & pertinent test results  Airway Mallampati: II  TM Distance: >3 FB Neck ROM: Full    Dental no notable dental hx.    Pulmonary neg pulmonary ROS, sleep apnea and Continuous Positive Airway Pressure Ventilation ,    Pulmonary exam normal breath sounds clear to auscultation       Cardiovascular hypertension, Pt. on medications + DVT  Normal cardiovascular exam+ dysrhythmias Atrial Fibrillation  Rhythm:Regular Rate:Normal     Neuro/Psych negative neurological ROS  negative psych ROS   GI/Hepatic negative GI ROS, Neg liver ROS,   Endo/Other  negative endocrine ROS  Renal/GU negative Renal ROS  negative genitourinary   Musculoskeletal negative musculoskeletal ROS (+)   Abdominal   Peds negative pediatric ROS (+)  Hematology negative hematology ROS (+)   Anesthesia Other Findings   Reproductive/Obstetrics negative OB ROS                             Anesthesia Physical Anesthesia Plan  ASA: II  Anesthesia Plan: MAC   Post-op Pain Management:    Induction: Intravenous  PONV Risk Score and Plan:   Airway Management Planned: Nasal Cannula  Additional Equipment:   Intra-op Plan:   Post-operative Plan:   Informed Consent: I have reviewed the patients History and Physical, chart, labs and discussed the procedure including the risks, benefits and alternatives for the proposed anesthesia with the patient or authorized representative who has indicated his/her understanding and acceptance.   Dental advisory given  Plan Discussed with:   Anesthesia Plan Comments:         Anesthesia Quick Evaluation

## 2016-10-15 NOTE — Transfer of Care (Signed)
Immediate Anesthesia Transfer of Care Note  Patient: Carlos Gutierrez  Procedure(s) Performed: Procedure(s): ESOPHAGOGASTRODUODENOSCOPY (EGD) WITH PROPOFOL (N/A)  Patient Location: PACU  Anesthesia Type:MAC  Level of Consciousness: alert   Airway & Oxygen Therapy: Patient Spontanous Breathing and Patient connected to nasal cannula oxygen  Post-op Assessment: Report given to RN and Post -op Vital signs reviewed and stable  Post vital signs: Reviewed and stable  Last Vitals:  Vitals:   10/15/16 0945  BP: 128/77  Pulse: (!) 56  Resp: 19  Temp: 36.4 C    Last Pain:  Vitals:   10/15/16 0945  TempSrc: Oral         Complications: No apparent anesthesia complications

## 2016-10-19 ENCOUNTER — Telehealth: Payer: Self-pay | Admitting: Interventional Cardiology

## 2016-10-19 NOTE — Telephone Encounter (Signed)
New message      Pt want to know if he can start exercising again?  He want to walk on the treadmill, use the elliptical, etc.  Please call

## 2016-10-19 NOTE — Telephone Encounter (Signed)
Will route to [employer], PA-C as she seen pt on 10/11/16 and pt has not been seen by Dr. Tamala Julian since 2015.

## 2016-10-22 NOTE — Telephone Encounter (Signed)
Spoke with pt and he was upset that he had not heard back from anyone in regards to original call.  Advised I would send message to Lyda Jester, PA-C and Dr. Tamala Julian and would call back once I hear back.  Pt appreciative for assistance.

## 2016-10-22 NOTE — Telephone Encounter (Signed)
Follow Up: Carlos Gutierrez is calling to follow up on his previous call. Stated he has not received a call back

## 2016-10-23 NOTE — Telephone Encounter (Signed)
10 days of anticoagulation before beginning exercise program. This will allow DVT to organize and decrease risk of emboli.

## 2016-10-25 NOTE — Telephone Encounter (Signed)
Spoke with pt and went over recommendations per Dr. Smith.  Pt verbalized understanding and was in agreement with this plan.   

## 2016-12-06 DIAGNOSIS — L821 Other seborrheic keratosis: Secondary | ICD-10-CM | POA: Diagnosis not present

## 2016-12-06 DIAGNOSIS — D225 Melanocytic nevi of trunk: Secondary | ICD-10-CM | POA: Diagnosis not present

## 2016-12-06 DIAGNOSIS — D1801 Hemangioma of skin and subcutaneous tissue: Secondary | ICD-10-CM | POA: Diagnosis not present

## 2016-12-06 DIAGNOSIS — L814 Other melanin hyperpigmentation: Secondary | ICD-10-CM | POA: Diagnosis not present

## 2016-12-06 DIAGNOSIS — L570 Actinic keratosis: Secondary | ICD-10-CM | POA: Diagnosis not present

## 2016-12-29 ENCOUNTER — Encounter: Payer: Self-pay | Admitting: Interventional Cardiology

## 2017-01-10 NOTE — Progress Notes (Addendum)
Cardiology Office Note    Date:  01/11/2017   ID:  Darin, Arndt Jul 02, 1950, MRN [id number]  PCP:  London Pepper, MD  Cardiologist: Sinclair Grooms, MD   Chief Complaint  Patient presents with  . Atrial Fibrillation  . Follow-up    DVT    History of Present Illness:  Carlos Gutierrez is a 66 y.o. male a 66 year old male with paroxysmal atrial fibrillation (PRN Pill-in-pocket Flecainide) w/ CHADSVASC score of 1 (will be 2 next January when he turns 24) recently admitted for atypical chest pain with 3 negative troponin's and nonischemic EKG. D-dimer was mildly elevated. Chest CT ordered to exclude PE given his extensive air travel. CT of chest negative for PE, however LE venous dopplers confirmed DVT. He was started on Xarelto, 15 mg BID x 3 weeks, followed by 20 mg daily thereafter.   Since last visit no significant chest discomfort.  He developed DVT earlier this year involving involving the left peroneal.  It was identified in the setting of chest pain with negative CT workup for PE.  The CT scan demonstrated scattered coronary calcification.  There is a family history of coronary artery disease.  Since his presentation was with chest pain, he saw GI after the diagnosis of DVT was made.  A repeat GI workup has been done and no findings to explain chest discomfort.  He has not had chest discomfort of any significance since July.  Continues to have intermittent episodes of atrial fibrillation that he experiences.  No transient neurological complaints.  No bleeding complications on Xarelto.   Past Medical History:  Diagnosis Date  . ACL tear    RIGHT KNEE  . Atrial fibrillation ([place]) 2012   Rare occurrences. On ASA & Flecainide prn  . Atrial flutter ([place] [facility])    s/p ablation 2008. On Flecainide prn.  . Broken leg 1968  . Concussion 1966   History of 4  . DVT (deep venous thrombosis) ([facility])    LEFT LOWER LEG  . Dyslipidemia   . Essential hypertension, benign   . Insomnia    . Low back pain   . Obesity (BMI 30-39.9)   . Obstructive sleep apnea on CPAP    Mild  . PAF (paroxysmal atrial fibrillation) ([facility])    Rare occurrences. On ASA & Flecainide prn  . Pure hypercholesterolemia    On Lipitor   . Right shoulder pain    Intermittent  . Status post ablation of atrial flutter 2008  . Umbilical hernia     Past Surgical History:  Procedure Laterality Date  . ablation for atrial flutter  01/2002  . CARDIAC CATHETERIZATION  12/26/01  . COLONOSCOPY WITH PROPOFOL N/A 03/12/2016   Procedure: COLONOSCOPY WITH PROPOFOL;  Surgeon: [name], MD;  Location: [facility];  Service: Endoscopy;  Laterality: N/A;  . coronary angiography  1997   40% stenosis  . ESOPHAGOGASTRODUODENOSCOPY (EGD) WITH PROPOFOL N/A 10/15/2016   Procedure: ESOPHAGOGASTRODUODENOSCOPY (EGD) WITH PROPOFOL;  Surgeon: [name], MD;  Location: [facility];  Service: Endoscopy;  Laterality: N/A;  . right knee surgery  1996   ACL, meniscus problems  . UMBILICAL HERNIA REPAIR  04/2015    Current Medications: Outpatient Medications Prior to Visit  Medication Sig Dispense Refill  . acetaminophen (TYLENOL) 325 MG tablet Take 325-650 mg by mouth every 6 (six) hours as needed (for pain.).    [name] aspirin EC 81 MG tablet Take 1 tablet (81 mg total) by mouth daily. 30 tablet  0  . atorvastatin (LIPITOR) 20 MG tablet Take 20 mg by mouth daily with supper.     . cetirizine (ZYRTEC) 10 MG tablet Take 10 mg by mouth at bedtime as needed for allergies.    . Eszopiclone 3 MG TABS Take 1.5-3 mg by mouth at bedtime as needed (sleep). Take immediately before bedtime     . flecainide (TAMBOCOR) 100 MG tablet Take two tablets (200 mg) by mouth daily as needed for Afib (Patient taking differently: Take 200 mg by mouth daily as needed (for afib). Take two tablets (200 mg) by mouth daily as needed for Afib) 60 tablet 6  . Multiple Vitamins-Minerals (MULTIVITAMIN WITH MINERALS) tablet Take 1 tablet by mouth daily.       [name] olmesartan-hydrochlorothiazide (BENICAR HCT) 20-12.5 MG tablet Take 1 tablet by mouth daily. 90 tablet 3  . pantoprazole (PROTONIX) 40 MG tablet Take 1 tablet (40 mg total) by mouth 2 (two) times daily. 30 tablet 11  . rivaroxaban (XARELTO) 20 MG TABS tablet Take 1 tablet (20 mg total) by mouth daily with supper. 30 tablet 11  . cephALEXin (KEFLEX) 500 MG capsule Take 500 mg by mouth 2 (two) times daily.    Vladimir Faster Glycol-Propyl Glycol (LUBRICANT EYE DROPS) 0.4-0.3 % SOLN Place 1 drop into both eyes 3 (three) times daily as needed (for dry/irritated eyes.).     No facility-administered medications prior to visit.      Allergies:   Patient has no known allergies.   Social History   Social History  . Marital status: Married    Spouse name: N/A  . Number of children: 2  . Years of education: N/A   Occupational History  . product manager Other   Social History Main Topics  . Smoking status: Never Smoker  . Smokeless tobacco: Never Used  . Alcohol use 4.2 - 6.0 oz/week    7 - 10 Glasses of wine per week  . Drug use: No  . Sexual activity: Not Asked   Other Topics Concern  . None   Social History Narrative   Caffeine: yes, coffee 2-3 cups daily, tea-rare.    Exercise-yes, 2-3X weekly, jog/run.    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    Marital Status: Married.    Children: 2 children-grown                 Family History:  The patient's family history includes COPD in his mother; Colon cancer (age of onset: 5) in his maternal grandfather; Diabetes in his mother; Diabetes (age of onset: 5) in his brother; Heart attack in his father and paternal grandfather; Heart disease in his mother; Heart disease (age of onset: 84) in his father; Heart failure in his father; Hypertension in his father; Hypertension (age of onset: 36) in his brother; Hypertension (age of onset: 40) in his mother.   ROS:   Please  see the history of present illness.    Notices some left lower extremity swelling towards the end of the day.  Has some anxiety concerning his overall health. All other systems reviewed and are negative.   PHYSICAL EXAM:   VS:  BP 130/88 (BP Location: Left Arm)   Pulse 70   Ht 6\' 1"  (1.854 m)   Wt 237 lb 6.4 oz (107.7 kg)   BMI 31.32 kg/m    GEN: Well nourished, well developed, in no acute distress  HEENT: normal  Neck: no JVD, carotid  bruits, or masses Cardiac: RRR; no murmurs, rubs, or gallops,no edema  Respiratory:  clear to auscultation bilaterally, normal work of breathing GI: soft, nontender, nondistended, + BS MS: no deformity or atrophy  Skin: warm and dry, no rash Neuro:  Alert and Oriented x 3, Strength and sensation are intact Psych: euthymic mood, full affect  Wt Readings from Last 3 Encounters:  01/11/17 237 lb 6.4 oz (107.7 kg)  10/15/16 234 lb (106.1 kg)  10/11/16 234 lb 12.8 oz (106.5 kg)      Studies/Labs Reviewed:   EKG:  EKG not reviewed.  The tracing performed October 11, 2016 demonstrated prominent voltage, no acute ST-T wave change, no evidence of infarction.  Recent Labs: 09/21/2016: BUN 20; Creatinine, Ser 1.14; Hemoglobin 15.7; Magnesium 2.0; Platelets 177; Potassium 3.8; Sodium 136   Lipid Panel No results found for: CHOL, TRIG, HDL, CHOLHDL, VLDL, LDLCALC, LDLDIRECT  Additional studies/ records that were reviewed today include:  Echocardiogram performed 09/21/2016: Study Conclusions  - Left ventricle: The cavity size was normal. Wall thickness was   increased in a pattern of mild LVH. Systolic function was normal.   The estimated ejection fraction was in the range of 55% to 60%.   Wall motion was normal; there were no regional wall motion   abnormalities. Features are mostly consistent with a pseudonormal   left ventricular filling pattern, with concomitant abnormal   relaxation and increased filling pressure (grade 2 diastolic    dysfunction). - Left atrium: The atrium was mildly dilated.  CT scan performed on 09/21/2016 to rule out PE:  Cardiovascular: Satisfactory opacification of the pulmonary arteries to the segmental level. No evidence of pulmonary embolism. Normal heart size. No pericardial effusion. Thoracic aorta is normal in caliber. No thoracic aortic dissection. 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    ASSESSMENT:    1. Chest pain, unspecified type   2. Coronary artery calcification seen on CT scan   3. Essential hypertension   4. Paroxysmal atrial fibrillation ([facility])   5. Obstructive sleep apnea on CPAP   6. Deep vein thrombosis (DVT) of left lower extremity, unspecified chronicity, unspecified vein ([facility])      PLAN:  In order of problems listed above:  1. Still concerned that recurring episodes of chest pain could be cardiac ischemia related especially given the finding of three-vessel coronary calcification on recent chest CT.  Because of this and in absence of explanation for chest pain, a stress myocardial perfusion study will be done to exclude evidence of significant obstruction.  Did not order a coronary CTA with morphology because of the noted calcification. 2. See data above. 3. Stable target 140/90 mmHg or less. 4. Intermittent moderate palpitations that he equates with atrial fibrillation.  Chads VASC is greater than 2.  We will recommend continuing Xarelto indefinitely.  Xarelto was initially started because of DVT.  Plan stress Cardiolite/Myoview to exclude significant ischemia.  Risk factor modification should include LDL less than 70 given appearance of calcium on CT.  I have no available very concerning lipid levels.  We will plan to perform a lipid panel and see him out on his return for the stress test.  If LDL is greater than 70 we will need to start statin therapy.  We need to address the combination of aspirin and Xarelto.  If no evidence of ischemia,  aspirin will be significantly decreased or discontinued.  Last LDL cholesterol was 68 in January 2018.  Hemoglobin A1c was 6.2.  ADDENDUM 02/01/2017: I personally reviewed the nuclear perfusion study performed in a demonstrates a relatively fixed small to moderate-sized mid to apical anterior wall perfusion abnormality that cannot be accounted for by soft tissue attenuation.  I am concerned about the possibility of obstructive disease perhaps with collaterals.  The official report is as follows: Study Highlights   IMPRESSION: The images just significant obstructive coronary disease perhaps with prior myocardial injury.  This perfusion abnormality in addition to coronary calcification noted on CT requires that angiography be performed to define anatomy and help guide therapy and future prognosis.  RECOMMENDATION: The patient was counseled to undergo left heart catheterization, coronary angiography, and possible percutaneous coronary intervention with stent implantation. The procedural risks and benefits were discussed in detail. The risks discussed included death, stroke, myocardial infarction, life-threatening bleeding, limb ischemia, kidney injury, allergy, and possible emergency cardiac surgery. The risk of these significant complications were estimated to occur less than 1% of the time. After discussion, the patient has agreed to proceed.  Rosanna Randy, III, MD 02/01/2017    MEDICATION Adjustments/Labs and Tests Ordered: Current medicines are reviewed at length with the patient today.  Concerns regarding medicines are outlined above.  Medication changes, Labs and Tests ordered today are listed in the Patient Instructions below. Patient Instructions  Medication Instructions:  Your physician recommends that you continue on your current medications as directed. Please refer to the Current Medication list given to you today.  Labwork: None ordered   Testing/Procedures: Your physician has  requested that you have a lexiscan myoview. For further information please visit [email or web address]. Please follow instruction sheet, as given.   Follow-Up: Your physician wants you to follow-up in: 6 months with Dr. Tamala Julian.  You will receive a reminder letter in the mail two months in advance. If you don't receive a letter, please call our office to schedule the follow-up appointment.  Any Other Special Instructions Will Be Listed Below (If Applicable).  Your physician would like for you to wear compression socks while on flights to prevent recurrent blood clots in your legs.    If you need a refill on your cardiac medications before your next appointment, please call your pharmacy.     Signed, Sinclair Grooms, MD  01/11/2017 9:01 AM    [facility] [address], [place], [place]  [address] Phone: [phone]; Fax: [phone]

## 2017-01-11 ENCOUNTER — Ambulatory Visit (INDEPENDENT_AMBULATORY_CARE_PROVIDER_SITE_OTHER): Payer: BLUE CROSS/BLUE SHIELD | Admitting: Interventional Cardiology

## 2017-01-11 ENCOUNTER — Encounter: Payer: Self-pay | Admitting: Interventional Cardiology

## 2017-01-11 VITALS — BP 130/88 | HR 70 | Ht 73.0 in | Wt 237.4 lb

## 2017-01-11 DIAGNOSIS — I2510 Atherosclerotic heart disease of native coronary artery without angina pectoris: Secondary | ICD-10-CM | POA: Diagnosis not present

## 2017-01-11 DIAGNOSIS — Z9989 Dependence on other enabling machines and devices: Secondary | ICD-10-CM | POA: Diagnosis not present

## 2017-01-11 DIAGNOSIS — I82402 Acute embolism and thrombosis of unspecified deep veins of left lower extremity: Secondary | ICD-10-CM | POA: Diagnosis not present

## 2017-01-11 DIAGNOSIS — R079 Chest pain, unspecified: Secondary | ICD-10-CM | POA: Diagnosis not present

## 2017-01-11 DIAGNOSIS — G4733 Obstructive sleep apnea (adult) (pediatric): Secondary | ICD-10-CM | POA: Diagnosis not present

## 2017-01-11 DIAGNOSIS — I480 Paroxysmal atrial fibrillation: Secondary | ICD-10-CM

## 2017-01-11 DIAGNOSIS — I10 Essential (primary) hypertension: Secondary | ICD-10-CM

## 2017-01-11 NOTE — Patient Instructions (Addendum)
Medication Instructions:  Your physician recommends that you continue on your current medications as directed. Please refer to the Current Medication list given to you today.  Labwork: Your physician recommends that you return for lab work prior to your stress test for complete metabolic and fasting lipid panel.   Testing/Procedures: Your physician has requested that you have a lexiscan myoview. For further information please visit [email or web address]. Please follow instruction sheet, as given.   Follow-Up: Your physician wants you to follow-up in: 6 months with Dr. Tamala Julian.  You will receive a reminder letter in the mail two months in advance. If you don't receive a letter, please call our office to schedule the follow-up appointment.  Any Other Special Instructions Will Be Listed Below (If Applicable).  Your physician would like for you to wear compression socks while on flights to prevent recurrent blood clots in your legs.    If you need a refill on your cardiac medications before your next appointment, please call your pharmacy.

## 2017-01-27 ENCOUNTER — Telehealth (HOSPITAL_COMMUNITY): Payer: Self-pay | Admitting: *Deleted

## 2017-01-27 NOTE — Telephone Encounter (Signed)
Patient's wife, per DPR, given detailed instructions per Myocardial Perfusion Study Information Sheet for the test on 01/31/17. Patient notified to arrive 15 minutes early and that it is imperative to arrive on time for appointment to keep from having the test rescheduled.  If you need to cancel or reschedule your appointment, please call the office within 24 hours of your appointment. . Patient verbalized understanding.  Kirstie Peri

## 2017-01-31 ENCOUNTER — Other Ambulatory Visit: Payer: BLUE CROSS/BLUE SHIELD | Admitting: *Deleted

## 2017-01-31 ENCOUNTER — Ambulatory Visit (HOSPITAL_COMMUNITY): Payer: BLUE CROSS/BLUE SHIELD | Attending: Cardiology

## 2017-01-31 DIAGNOSIS — I2510 Atherosclerotic heart disease of native coronary artery without angina pectoris: Secondary | ICD-10-CM | POA: Insufficient documentation

## 2017-01-31 DIAGNOSIS — R079 Chest pain, unspecified: Secondary | ICD-10-CM | POA: Diagnosis not present

## 2017-01-31 LAB — COMPREHENSIVE METABOLIC PANEL
ALK PHOS: 81 IU/L (ref 39–117)
ALT: 47 IU/L — ABNORMAL HIGH (ref 0–44)
AST: 42 IU/L — AB (ref 0–40)
Albumin/Globulin Ratio: 2.1 (ref 1.2–2.2)
Albumin: 4.6 g/dL (ref 3.6–4.8)
BILIRUBIN TOTAL: 0.9 mg/dL (ref 0.0–1.2)
BUN / CREAT RATIO: 14 (ref 10–24)
BUN: 16 mg/dL (ref 8–27)
CHLORIDE: 104 mmol/L (ref 96–106)
CO2: 22 mmol/L (ref 20–29)
CREATININE: 1.18 mg/dL (ref 0.76–1.27)
Calcium: 9.2 mg/dL (ref 8.6–10.2)
GFR calc Af Amer: 75 mL/min/{1.73_m2} (ref >59)
GFR calc non Af Amer: 65 mL/min/{1.73_m2} (ref >59)
GLOBULIN, TOTAL: 2.2 g/dL (ref 1.5–4.5)
GLUCOSE: 143 mg/dL — AB (ref 65–99)
Potassium: 4.2 mmol/L (ref 3.5–5.2)
SODIUM: 141 mmol/L (ref 134–144)
Total Protein: 6.8 g/dL (ref 6.0–8.5)

## 2017-01-31 LAB — LIPID PANEL
CHOLESTEROL TOTAL: 148 mg/dL (ref 100–199)
Chol/HDL Ratio: 3.4 ratio (ref 0.0–5.0)
HDL: 43 mg/dL (ref >39)
LDL CALC: 70 mg/dL (ref 0–99)
TRIGLYCERIDES: 173 mg/dL — AB (ref 0–149)
VLDL Cholesterol Cal: 35 mg/dL (ref 5–40)

## 2017-01-31 LAB — MYOCARDIAL PERFUSION IMAGING
CHL CUP NUCLEAR SSS: 7
CSEPPHR: 70 {beats}/min
LHR: 0.33
LVDIAVOL: 126 mL (ref 62–150)
LVSYSVOL: 56 mL
Rest HR: 59 {beats}/min
SDS: 1
SRS: 6
TID: 1.03

## 2017-01-31 MED ORDER — TECHNETIUM TC 99M TETROFOSMIN IV KIT
32.7000 | PACK | Freq: Once | INTRAVENOUS | Status: AC | PRN
  Administered 2017-01-31: 32.7 via INTRAVENOUS
  Filled 2017-01-31: qty 33

## 2017-01-31 MED ORDER — TECHNETIUM TC 99M TETROFOSMIN IV KIT
10.2000 | PACK | Freq: Once | INTRAVENOUS | Status: AC | PRN
  Administered 2017-01-31: 10.2 via INTRAVENOUS
  Filled 2017-01-31: qty 11

## 2017-01-31 MED ORDER — REGADENOSON 0.4 MG/5ML IV SOLN
0.4000 mg | Freq: Once | INTRAVENOUS | Status: AC
  Administered 2017-01-31: 0.4 mg via INTRAVENOUS

## 2017-02-01 ENCOUNTER — Encounter: Payer: Self-pay | Admitting: *Deleted

## 2017-02-01 ENCOUNTER — Telehealth: Payer: Self-pay | Admitting: Interventional Cardiology

## 2017-02-01 DIAGNOSIS — R079 Chest pain, unspecified: Secondary | ICD-10-CM

## 2017-02-01 DIAGNOSIS — I4891 Unspecified atrial fibrillation: Secondary | ICD-10-CM

## 2017-02-01 DIAGNOSIS — Z01812 Encounter for preprocedural laboratory examination: Secondary | ICD-10-CM

## 2017-02-01 NOTE — Telephone Encounter (Signed)
Spoke with pt about stress test results discussed with his wife yesterday.  Pt would like to proceed with heart cath on 11/30.  Advised I will call and get this scheduled and call back with instructions.  Pt appreciative for call.

## 2017-02-01 NOTE — Telephone Encounter (Signed)
Spoke with pt and went over cath instructions.  Pt will come for pre cath labs on 11/21.  Advised pt to also pick up letter of instruction when he is here for labs tomorrow.  Pt verbalized understanding and was in agreement with this plan.

## 2017-02-01 NOTE — Telephone Encounter (Signed)
History of physical is been updated.  I spoke with the patient concerning the risks and benefits of diagnostic coronary angiography.  He is willing to proceed.

## 2017-02-01 NOTE — Telephone Encounter (Signed)
Follow Up:; ° ° °Returning your call. °

## 2017-02-02 ENCOUNTER — Other Ambulatory Visit: Payer: BLUE CROSS/BLUE SHIELD | Admitting: *Deleted

## 2017-02-02 DIAGNOSIS — R079 Chest pain, unspecified: Secondary | ICD-10-CM

## 2017-02-02 DIAGNOSIS — I4891 Unspecified atrial fibrillation: Secondary | ICD-10-CM

## 2017-02-02 DIAGNOSIS — Z01812 Encounter for preprocedural laboratory examination: Secondary | ICD-10-CM

## 2017-02-03 LAB — CBC
HEMATOCRIT: 47.1 % (ref 37.5–51.0)
HEMOGLOBIN: 15.3 g/dL (ref 13.0–17.7)
MCH: 29.1 pg (ref 26.6–33.0)
MCHC: 32.5 g/dL (ref 31.5–35.7)
MCV: 90 fL (ref 79–97)
Platelets: 195 10*3/uL (ref 150–379)
RBC: 5.25 x10E6/uL (ref 4.14–5.80)
RDW: 14.8 % (ref 12.3–15.4)
WBC: 6.1 10*3/uL (ref 3.4–10.8)

## 2017-02-03 LAB — PROTIME-INR
INR: 1.1 (ref 0.8–1.2)
Prothrombin Time: 11.6 s (ref 9.1–12.0)

## 2017-02-10 ENCOUNTER — Telehealth: Payer: Self-pay

## 2017-02-10 DIAGNOSIS — R931 Abnormal findings on diagnostic imaging of heart and coronary circulation: Secondary | ICD-10-CM

## 2017-02-10 NOTE — Telephone Encounter (Signed)
Spoke with wife. Patient contacted pre-catheterization at [facility] scheduled for:  02/11/2017 @ 0730 Verified arrival time and place:  [facility] @ 0530 Confirmed AM meds to be taken pre-cath with sip of water: Hold Xarelto starting Wednesday until post cath Hold olmes-hctz am of Take ASA  Confirmed patient has responsible person to drive home post procedure and observe patient for 24 hours:  yes Addl concerns:  none

## 2017-02-11 ENCOUNTER — Ambulatory Visit (HOSPITAL_COMMUNITY)
Admission: RE | Admit: 2017-02-11 | Discharge: 2017-02-11 | Disposition: A | Discharge status: Home / Self Care | Payer: BLUE CROSS/BLUE SHIELD | Source: Ambulatory Visit | Attending: Interventional Cardiology | Admitting: Interventional Cardiology

## 2017-02-11 ENCOUNTER — Encounter (HOSPITAL_COMMUNITY)
Admission: RE | Disposition: A | Discharge status: Home / Self Care | Payer: Self-pay | Source: Ambulatory Visit | Attending: Interventional Cardiology

## 2017-02-11 ENCOUNTER — Other Ambulatory Visit: Payer: Self-pay

## 2017-02-11 ENCOUNTER — Encounter (HOSPITAL_COMMUNITY): Payer: Self-pay | Admitting: Interventional Cardiology

## 2017-02-11 DIAGNOSIS — R079 Chest pain, unspecified: Secondary | ICD-10-CM

## 2017-02-11 DIAGNOSIS — Z7982 Long term (current) use of aspirin: Secondary | ICD-10-CM | POA: Insufficient documentation

## 2017-02-11 DIAGNOSIS — E7800 Pure hypercholesterolemia, unspecified: Secondary | ICD-10-CM | POA: Diagnosis not present

## 2017-02-11 DIAGNOSIS — Z7901 Long term (current) use of anticoagulants: Secondary | ICD-10-CM | POA: Insufficient documentation

## 2017-02-11 DIAGNOSIS — I10 Essential (primary) hypertension: Secondary | ICD-10-CM | POA: Diagnosis not present

## 2017-02-11 DIAGNOSIS — I2510 Atherosclerotic heart disease of native coronary artery without angina pectoris: Secondary | ICD-10-CM | POA: Insufficient documentation

## 2017-02-11 DIAGNOSIS — Z86718 Personal history of other venous thrombosis and embolism: Secondary | ICD-10-CM | POA: Insufficient documentation

## 2017-02-11 DIAGNOSIS — I480 Paroxysmal atrial fibrillation: Secondary | ICD-10-CM | POA: Diagnosis not present

## 2017-02-11 DIAGNOSIS — Z6831 Body mass index (BMI) 31.0-31.9, adult: Secondary | ICD-10-CM | POA: Diagnosis not present

## 2017-02-11 DIAGNOSIS — Z8249 Family history of ischemic heart disease and other diseases of the circulatory system: Secondary | ICD-10-CM | POA: Diagnosis not present

## 2017-02-11 DIAGNOSIS — E669 Obesity, unspecified: Secondary | ICD-10-CM | POA: Insufficient documentation

## 2017-02-11 DIAGNOSIS — G4733 Obstructive sleep apnea (adult) (pediatric): Secondary | ICD-10-CM | POA: Insufficient documentation

## 2017-02-11 DIAGNOSIS — G4700 Insomnia, unspecified: Secondary | ICD-10-CM | POA: Insufficient documentation

## 2017-02-11 DIAGNOSIS — Z79899 Other long term (current) drug therapy: Secondary | ICD-10-CM | POA: Diagnosis not present

## 2017-02-11 DIAGNOSIS — R931 Abnormal findings on diagnostic imaging of heart and coronary circulation: Secondary | ICD-10-CM | POA: Diagnosis not present

## 2017-02-11 DIAGNOSIS — E785 Hyperlipidemia, unspecified: Secondary | ICD-10-CM | POA: Insufficient documentation

## 2017-02-11 DIAGNOSIS — I4891 Unspecified atrial fibrillation: Secondary | ICD-10-CM | POA: Diagnosis present

## 2017-02-11 HISTORY — PX: LEFT HEART CATH AND CORONARY ANGIOGRAPHY: CATH118249

## 2017-02-11 SURGERY — LEFT HEART CATH AND CORONARY ANGIOGRAPHY
Anesthesia: LOCAL

## 2017-02-11 MED ORDER — LIDOCAINE HCL (PF) 1 % IJ SOLN
INTRAMUSCULAR | Status: AC
  Filled 2017-02-11: qty 30

## 2017-02-11 MED ORDER — IOPAMIDOL (ISOVUE-370) INJECTION 76%
INTRAVENOUS | Status: DC | PRN
  Administered 2017-02-11: 165 mL via INTRA_ARTERIAL

## 2017-02-11 MED ORDER — SODIUM CHLORIDE 0.9 % IV SOLN: 250.0000 mL | INTRAVENOUS | Status: DC | PRN

## 2017-02-11 MED ORDER — SODIUM CHLORIDE 0.9% FLUSH: 3.0000 mL | Freq: Two times a day (BID) | INTRAVENOUS | Status: DC

## 2017-02-11 MED ORDER — ASPIRIN 81 MG PO CHEW: 81.0000 mg | CHEWABLE_TABLET | ORAL | Status: DC

## 2017-02-11 MED ORDER — ONDANSETRON HCL 4 MG/2ML IJ SOLN: 4.0000 mg | Freq: Four times a day (QID) | INTRAMUSCULAR | Status: DC | PRN

## 2017-02-11 MED ORDER — LIDOCAINE HCL (PF) 1 % IJ SOLN
INTRAMUSCULAR | Status: DC | PRN
  Administered 2017-02-11: 2 mL

## 2017-02-11 MED ORDER — SODIUM CHLORIDE 0.9% FLUSH: 3.0000 mL | INTRAVENOUS | Status: DC | PRN

## 2017-02-11 MED ORDER — FENTANYL CITRATE (PF) 100 MCG/2ML IJ SOLN
INTRAMUSCULAR | Status: DC | PRN
  Administered 2017-02-11: 50 ug via INTRAVENOUS

## 2017-02-11 MED ORDER — SODIUM CHLORIDE 0.9 % WEIGHT BASED INFUSION: 1.0000 mL/kg/h | INTRAVENOUS | Status: DC

## 2017-02-11 MED ORDER — VERAPAMIL HCL 2.5 MG/ML IV SOLN
INTRAVENOUS | Status: DC | PRN
  Administered 2017-02-11: 10 mL via INTRA_ARTERIAL

## 2017-02-11 MED ORDER — SODIUM CHLORIDE 0.9 % WEIGHT BASED INFUSION
3.0000 mL/kg/h | INTRAVENOUS | Status: AC
  Administered 2017-02-11: 3 mL/kg/h via INTRAVENOUS

## 2017-02-11 MED ORDER — ATORVASTATIN CALCIUM 40 MG PO TABS: 40.0000 mg | ORAL_TABLET | Freq: Every day | ORAL | 11 refills | Status: DC

## 2017-02-11 MED ORDER — HEPARIN (PORCINE) IN NACL 2-0.9 UNIT/ML-% IJ SOLN
INTRAMUSCULAR | Status: AC
  Filled 2017-02-11: qty 1000

## 2017-02-11 MED ORDER — VERAPAMIL HCL 2.5 MG/ML IV SOLN
INTRAVENOUS | Status: AC
  Filled 2017-02-11: qty 2

## 2017-02-11 MED ORDER — MIDAZOLAM HCL 2 MG/2ML IJ SOLN
INTRAMUSCULAR | Status: AC
  Filled 2017-02-11: qty 2

## 2017-02-11 MED ORDER — HEPARIN SODIUM (PORCINE) 1000 UNIT/ML IJ SOLN
INTRAMUSCULAR | Status: AC
  Filled 2017-02-11: qty 1

## 2017-02-11 MED ORDER — HEPARIN (PORCINE) IN NACL 2-0.9 UNIT/ML-% IJ SOLN
INTRAMUSCULAR | Status: AC | PRN
  Administered 2017-02-11: 1000 mL

## 2017-02-11 MED ORDER — MIDAZOLAM HCL 2 MG/2ML IJ SOLN
INTRAMUSCULAR | Status: DC | PRN
  Administered 2017-02-11 (×2): 1 mg via INTRAVENOUS

## 2017-02-11 MED ORDER — ACETAMINOPHEN 325 MG PO TABS: 650.0000 mg | ORAL_TABLET | ORAL | Status: DC | PRN

## 2017-02-11 MED ORDER — HEPARIN SODIUM (PORCINE) 1000 UNIT/ML IJ SOLN
INTRAMUSCULAR | Status: DC | PRN
  Administered 2017-02-11: 5000 [IU] via INTRAVENOUS

## 2017-02-11 MED ORDER — IOPAMIDOL (ISOVUE-370) INJECTION 76%
INTRAVENOUS | Status: AC
  Filled 2017-02-11: qty 100

## 2017-02-11 MED ORDER — FENTANYL CITRATE (PF) 100 MCG/2ML IJ SOLN
INTRAMUSCULAR | Status: AC
  Filled 2017-02-11: qty 2

## 2017-02-11 MED ORDER — RIVAROXABAN 20 MG PO TABS: 20.0000 mg | ORAL_TABLET | Freq: Every day | ORAL | 11 refills | Status: DC

## 2017-02-11 MED ORDER — SODIUM CHLORIDE 0.9 % IV SOLN: INTRAVENOUS | Status: DC

## 2017-02-11 SURGICAL SUPPLY — 16 items
CATH INFINITI 5 FR AR2 MOD (CATHETERS) ×1 IMPLANT
CATH INFINITI 5 FR JL3.5 (CATHETERS) ×2 IMPLANT
CATH INFINITI 5FR AL1 (CATHETERS) ×2 IMPLANT
CATH INFINITI JR4 5F (CATHETERS) ×1 IMPLANT
CATH LAUNCHER 5F RADR (CATHETERS) IMPLANT
CATHETER LAUNCHER 5F RADR (CATHETERS) ×2
COVER PRB 48X5XTLSCP FOLD TPE (BAG) IMPLANT
COVER PROBE 5X48 (BAG) ×2
DEVICE RAD COMP TR BAND LRG (VASCULAR PRODUCTS) ×1 IMPLANT
GLIDESHEATH SLEND A-KIT 6F 22G (SHEATH) ×1 IMPLANT
GUIDEWIRE INQWIRE 1.5J.035X260 (WIRE) IMPLANT
INQWIRE 1.5J .035X260CM (WIRE) ×2
KIT HEART LEFT (KITS) ×2 IMPLANT
PACK CARDIAC CATHETERIZATION (CUSTOM PROCEDURE TRAY) ×2 IMPLANT
TRANSDUCER W/STOPCOCK (MISCELLANEOUS) ×2 IMPLANT
TUBING CIL FLEX 10 FLL-RA (TUBING) ×2 IMPLANT

## 2017-02-11 NOTE — H&P (Signed)
Cardiology Updated H and P    Date: 02/01/2017   ID:  Carlos Gutierrez, Carlos Gutierrez 1950/07/21, MRN [id number]   PCP:  London Pepper, MD       Cardiologist: Sinclair Grooms, MD    Chief Complaint  Patient presents with  . Atrial Fibrillation  . Follow-up      DVT      History of Present Illness:  Carlos Gutierrez is a 66 y.o. male a 66 year old male with paroxysmal atrial fibrillation (PRN Pill-in-pocket Flecainide) w/ CHADSVASC score of 1 (will be 2 next January when he turns 39) recently admitted for atypical chest pain with 3 negative troponin's and nonischemic EKG. D-dimer was mildly elevated. Chest CT ordered to exclude PE given his extensive air travel. CT of chest negative for PE, however LE venous dopplers confirmed DVT. He was started on Xarelto, 15 mg BID x 3 weeks, followed by 20 mg daily thereafter.    Since last visit no significant chest discomfort.  He developed DVT earlier this year involving involving the left peroneal.  It was identified in the setting of chest pain with negative CT workup for PE.  The CT scan demonstrated scattered coronary calcification.  There is a family history of coronary artery disease.  Since his presentation was with chest pain, he saw GI after the diagnosis of DVT was made.  A repeat GI workup has been done and no findings to explain chest discomfort.  He has not had chest discomfort of any significance since July.   Continues to have intermittent episodes of atrial fibrillation that he experiences.  No transient neurological complaints.   No bleeding complications on Xarelto.         Past Medical History:  Diagnosis Date  . ACL tear      RIGHT KNEE  . Atrial fibrillation ([place]) 2012    Rare occurrences. On ASA & Flecainide prn  . Atrial flutter ([facility])      s/p ablation 2008. On Flecainide prn.  . Broken leg 1968  . Concussion 1966    History of 4  . DVT (deep venous thrombosis) ([facility])      LEFT LOWER LEG  . Dyslipidemia    . Essential  hypertension, benign    . Insomnia    . Low back pain    . Obesity (BMI 30-39.9)    . Obstructive sleep apnea on CPAP      Mild  . PAF (paroxysmal atrial fibrillation) ([place])      Rare occurrences. On ASA & Flecainide prn  . Pure hypercholesterolemia      On Lipitor   . Right shoulder pain      Intermittent  . Status post ablation of atrial flutter 2008  . Umbilical hernia        Past Surgical History:  Procedure Laterality Date  . ablation for atrial flutter   01/2002  . CARDIAC CATHETERIZATION   12/26/01  . COLONOSCOPY WITH PROPOFOL N/A 03/12/2016    Procedure: COLONOSCOPY WITH PROPOFOL;  Surgeon: [name], MD;  Location: [facility];  Service: Endoscopy;  Laterality: N/A;  . coronary angiography   1997    40% stenosis  . ESOPHAGOGASTRODUODENOSCOPY (EGD) WITH PROPOFOL N/A 10/15/2016    Procedure: ESOPHAGOGASTRODUODENOSCOPY (EGD) WITH PROPOFOL;  Surgeon: [name], MD;  Location: [facility];  Service: Endoscopy;  Laterality: N/A;  . right knee surgery   1996    ACL, meniscus problems  . UMBILICAL HERNIA REPAIR   04/2015  Current Medications:       Outpatient Medications Prior to Visit  Medication Sig Dispense Refill  . acetaminophen (TYLENOL) 325 MG tablet Take 325-650 mg by mouth every 6 (six) hours as needed (for pain.).      [name] aspirin EC 81 MG tablet Take 1 tablet (81 mg total) by mouth daily. 30 tablet 0  . atorvastatin (LIPITOR) 20 MG tablet Take 20 mg by mouth daily with supper.       . cetirizine (ZYRTEC) 10 MG tablet Take 10 mg by mouth at bedtime as needed for allergies.      . Eszopiclone 3 MG TABS Take 1.5-3 mg by mouth at bedtime as needed (sleep). Take immediately before bedtime       . flecainide (TAMBOCOR) 100 MG tablet Take two tablets (200 mg) by mouth daily as needed for Afib (Patient taking differently: Take 200 mg by mouth daily as needed (for afib). Take two tablets (200 mg) by mouth daily as needed for Afib) 60 tablet 6  . Multiple  Vitamins-Minerals (MULTIVITAMIN WITH MINERALS) tablet Take 1 tablet by mouth daily.        [name] olmesartan-hydrochlorothiazide (BENICAR HCT) 20-12.5 MG tablet Take 1 tablet by mouth daily. 90 tablet 3  . pantoprazole (PROTONIX) 40 MG tablet Take 1 tablet (40 mg total) by mouth 2 (two) times daily. 30 tablet 11  . rivaroxaban (XARELTO) 20 MG TABS tablet Take 1 tablet (20 mg total) by mouth daily with supper. 30 tablet 11  . cephALEXin (KEFLEX) 500 MG capsule Take 500 mg by mouth 2 (two) times daily.      Vladimir Faster Glycol-Propyl Glycol (LUBRICANT EYE DROPS) 0.4-0.3 % SOLN Place 1 drop into both eyes 3 (three) times daily as needed (for dry/irritated eyes.).        No facility-administered medications prior to visit.       Allergies:   Patient has no known allergies.    Social History         Social History  . Marital status: Married      Spouse name: N/A  . Number of children: 2  . Years of education: N/A        Occupational History  . product manager Other         Social History Main Topics  . Smoking status: Never Smoker  . Smokeless tobacco: Never Used  . Alcohol use 4.2 - 6.0 oz/week      7 - 10 Glasses of wine per week  . Drug use: No  . Sexual activity: Not Asked        Other Topics Concern  . None       Social History Narrative    Caffeine: yes, coffee 2-3 cups daily, tea-rare.     Exercise-yes, 2-3X weekly, jog/run.     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     Marital Status: Married.     Children: 2 children-grown                        Family History:  The patient's family history includes COPD in his mother; Colon cancer (age of onset: 19) in his maternal grandfather; Diabetes in his mother; Diabetes (age of onset: 2) in his brother; Heart attack in his father and paternal grandfather; Heart disease in his mother; Heart disease (age of onset: 17) in his father; Heart failure in his father;  Hypertension in his father;  Hypertension (age of onset: 10) in his brother; Hypertension (age of onset: 44) in his mother.    ROS:   Please see the history of present illness.    Notices some left lower extremity swelling towards the end of the day.  Has some anxiety concerning his overall health. All other systems reviewed and are negative.     PHYSICAL EXAM:   VS:  BP 130/88 (BP Location: Left Arm)   Pulse 70   Ht 6\' 1"  (1.854 m)   Wt 237 lb 6.4 oz (107.7 kg)   BMI 31.32 kg/m    GEN: Well nourished, well developed, in no acute distress  HEENT: normal  Neck: no JVD, carotid bruits, or masses Cardiac: RRR; no murmurs, rubs, or gallops,no edema  Respiratory:  clear to auscultation bilaterally, normal work of breathing GI: soft, nontender, nondistended, + BS MS: no deformity or atrophy  Skin: warm and dry, no rash Neuro:  Alert and Oriented x 3, Strength and sensation are intact Psych: euthymic mood, full affect      Wt Readings from Last 3 Encounters:  01/11/17 237 lb 6.4 oz (107.7 kg)  10/15/16 234 lb (106.1 kg)  10/11/16 234 lb 12.8 oz (106.5 kg)        Studies/Labs Reviewed:    EKG:  EKG not reviewed.  The tracing performed October 11, 2016 demonstrated prominent voltage, no acute ST-T wave change, no evidence of infarction.   Recent Labs: 09/21/2016: BUN 20; Creatinine, Ser 1.14; Hemoglobin 15.7; Magnesium 2.0; Platelets 177; Potassium 3.8; Sodium 136    Lipid Panel Labs (Brief)  No results found for: CHOL, TRIG, HDL, CHOLHDL, VLDL, LDLCALC, LDLDIRECT     Additional studies/ records that were reviewed today include:  Echocardiogram performed 09/21/2016: Study Conclusions   - Left ventricle: The cavity size was normal. Wall thickness was   increased in a pattern of mild LVH. Systolic function was normal.   The estimated ejection fraction was in the range of 55% to 60%.   Wall motion was normal; there were no regional wall motion   abnormalities. Features are mostly  consistent with a pseudonormal   left ventricular filling pattern, with concomitant abnormal   relaxation and increased filling pressure (grade 2 diastolic   dysfunction). - Left atrium: The atrium was mildly dilated.   CT scan performed on 09/21/2016 to rule out PE:   Cardiovascular: Satisfactory opacification of the pulmonary arteries to the segmental level. No evidence of pulmonary embolism. Normal heart size. No pericardial effusion. Thoracic aorta is normal in caliber. No thoracic aortic dissection. 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       ASSESSMENT:     1. Chest pain, unspecified type   2. Coronary artery calcification seen on CT scan   3. Essential hypertension   4. Paroxysmal atrial fibrillation ([facility])   5. Obstructive sleep apnea on CPAP   6. Deep vein thrombosis (DVT) of left lower extremity, unspecified chronicity, unspecified vein ([facility])         PLAN:  In order of problems listed above:   1. Still concerned that recurring episodes of chest pain could be cardiac ischemia related especially given the finding of three-vessel coronary calcification on recent chest CT.  Because of this and in absence of explanation for chest pain, a stress myocardial perfusion study will be done to exclude evidence of significant obstruction.  Did not order a coronary CTA with morphology because of the noted calcification. 2. See data above.  3. Stable target 140/90 mmHg or less. 4. Intermittent moderate palpitations that he equates with atrial fibrillation.  Chads VASC is greater than 2.  We will recommend continuing Xarelto indefinitely.  Xarelto was initially started because of DVT.   Plan stress Cardiolite/Myoview to exclude significant ischemia.  Risk factor modification should include LDL less than 70 given appearance of calcium on CT.  I have no available very concerning lipid levels.  We will plan to perform a lipid panel and see him out on his return for  the stress test.  If LDL is greater than 70 we will need to start statin therapy.  We need to address the combination of aspirin and Xarelto.  If no evidence of ischemia, aspirin will be significantly decreased or discontinued.  Last LDL cholesterol was 68 in January 2018.  Hemoglobin A1c was 6.2.   ADDENDUM 02/01/2017: I personally reviewed the nuclear perfusion study performed in a demonstrates a relatively fixed small to moderate-sized mid to apical anterior wall perfusion abnormality that cannot be accounted for by soft tissue attenuation.  I am concerned about the possibility of obstructive disease perhaps with collaterals.  The official report is as follows: Study Highlights    IMPRESSION: The images just significant obstructive coronary disease perhaps with prior myocardial injury.  This perfusion abnormality in addition to coronary calcification noted on CT requires that angiography be performed to define anatomy and help guide therapy and future prognosis.   RECOMMENDATION: The patient was counseled to undergo left heart catheterization, coronary angiography, and possible percutaneous coronary intervention with stent implantation. The procedural risks and benefits were discussed in detail. The risks discussed included death, stroke, myocardial infarction, life-threatening bleeding, limb ischemia, kidney injury, allergy, and possible emergency cardiac surgery. The risk of these significant complications were estimated to occur less than 1% of the time. After discussion, the patient has agreed to proceed. Helyn Numbers, MD 02/01/2017

## 2017-02-11 NOTE — Interval H&P Note (Signed)
Cath Lab Visit (complete for each Cath Lab visit)  Clinical Evaluation Leading to the Procedure:   ACS: No.  Non-ACS:    Anginal Classification: CCS II  Anti-ischemic medical therapy: Minimal Therapy (1 class of medications)  Non-Invasive Test Results: Low-risk stress test findings: cardiac mortality <1%/year  Prior CABG: No previous CABG      History and Physical Interval Note:  02/11/2017 7:22 AM  Carlos Gutierrez  has presented today for surgery, with the diagnosis of abnormal nuc - cp  The various methods of treatment have been discussed with the patient and family. After consideration of risks, benefits and other options for treatment, the patient has consented to  Procedure(s): LEFT HEART CATH AND CORONARY ANGIOGRAPHY (N/A) as a surgical intervention .  The patient's history has been reviewed, patient examined, no change in status, stable for surgery.  I have reviewed the patient's chart and labs.  Questions were answered to the patient's satisfaction.     [name]

## 2017-02-11 NOTE — Discharge Instructions (Signed)
**Note Carlos Gutierrez-identified via Obfuscation** Radial Site Care °Refer to this sheet in the next few weeks. These instructions provide you with information about caring for yourself after your procedure. Your health care provider may also give you more specific instructions. Your treatment has been planned according to current medical practices, but problems sometimes occur. Call your health care provider if you have any problems or questions after your procedure. °What can I expect after the procedure? °After your procedure, it is typical to have the following: °· Bruising at the radial site that usually fades within 1-2 weeks. °· Blood collecting in the tissue (hematoma) that may be painful to the touch. It should usually decrease in size and tenderness within 1-2 weeks. ° °Follow these instructions at home: °· Take medicines only as directed by your health care provider. °· You may shower 24-48 hours after the procedure or as directed by your health care provider. Remove the bandage (dressing) and gently wash the site with plain soap and water. Pat the area dry with a clean towel. Do not rub the site, because this may cause bleeding. °· Do not take baths, swim, or use a hot tub until your health care provider approves. °· Check your insertion site every day for redness, swelling, or drainage. °· Do not apply powder or lotion to the site. °· Do not flex or bend the affected arm for 24 hours or as directed by your health care provider. °· Do not push or pull heavy objects with the affected arm for 24 hours or as directed by your health care provider. °· Do not lift over 10 lb (4.5 kg) for 5 days after your procedure or as directed by your health care provider. °· Ask your health care provider when it is okay to: °? Return to work or school. °? Resume usual physical activities or sports. °? Resume sexual activity. °· Do not drive home if you are discharged the same day as the procedure. Have someone else drive you. °· You may drive 24 hours after the procedure  unless otherwise instructed by your health care provider. °· Do not operate machinery or power tools for 24 hours after the procedure. °· If your procedure was done as an outpatient procedure, which means that you went home the same day as your procedure, a responsible adult should be with you for the first 24 hours after you arrive home. °· Keep all follow-up visits as directed by your health care provider. This is important. °Contact a health care provider if: °· You have a fever. °· You have chills. °· You have increased bleeding from the radial site. Hold pressure on the site. °Get help right away if: °· You have unusual pain at the radial site. °· You have redness, warmth, or swelling at the radial site. °· You have drainage (other than a small amount of blood on the dressing) from the radial site. °· The radial site is bleeding, and the bleeding does not stop after 30 minutes of holding steady pressure on the site. °· Your arm or hand becomes pale, cool, tingly, or numb. °This information is not intended to replace advice given to you by your health care provider. Make sure you discuss any questions you have with your health care provider. °Document Released: 04/03/2010 Document Revised: 08/07/2015 Document Reviewed: 09/17/2013 °[employer]vier Interactive Patient Education © 2018 [employer] ° °

## 2017-02-18 ENCOUNTER — Other Ambulatory Visit: Payer: Self-pay | Admitting: Physician Assistant

## 2017-03-03 NOTE — Progress Notes (Signed)
Cardiology Office Note   Date:  03/04/2017   ID:  Craige, Patel 25-Apr-1950, MRN [id number]  PCP:  London Pepper, MD  Cardiologist:  Dr. Tamala Julian     Chief Complaint  Patient presents with  . Hospitalization Follow-up    post cath      History of Present Illness: JSAON YOO is a 66 y.o. male who presents for post hospitalization for cardiac cath.    He has a hx of paroxysmal atrial fibrillation (PRN Pill-in-pocket Flecainide) w/ CHADSVASC score of1 (will be 2 next January when he turns 76) recently admitted for atypical chest pain with 3negative troponin's andnonischemic EKG. D-dimer was mildly elevated. Chest CT ordered to exclude PE given his extensive air travel. CT of chest negative for PE, however LE venous dopplers confirmed DVT. He was started on Xarelto, 15 mg BID x 3 weeks, followed by 20 mg daily thereafter.    He developed DVT earlier this year involving involving the left peroneal.  It was identified in the setting of chest pain with negative CT workup for PE.  The CT scan demonstrated scattered coronary calcification.  There is a family history of coronary artery disease.  Since his presentation was with chest pain, he saw GI after the diagnosis of DVT was made.  A repeat GI workup has been done and no findings to explain chest discomfort.  He has not had chest discomfort of any significance since July.  Pt had nuc study with fixed ant wall scar.  Dr. Tamala Julian reviewed and felt with symptoms cardiac cath would be prudent.   Conclusion    Indication for study was a fixed anteroapical defect on nuclear scintigraphy raising question of silent occlusion of the LAD.  No ischemia was demonstrated on the study.  Left main widely patent.  LAD contains diffuse luminal irregularities with eccentric 40-50% proximal narrowing.  Obtuse marginal #1 contains tandem 40-50% stenoses.  Diffusely diseased right coronary with small caliber, 40-50% proximal to mid, 60-75%  distal, and small diffusely diseased PDA.  Left ventricular size and function are normal.  End-diastolic pressure is normal at 15 mmHg  RECOMMENDATIONS:   In the absence of demonstrable ischemia and anginal symptoms, aggressive risk factor modification will be enhanced.  We will double statin intensity.  Continue aspirin.  Weight loss and increased plant-based components in diet.  Report any chest pain.   Today he has no complains - still with some chest pressure with sitting, none with exercise.  No problems with rt wrist cath site.  No SOB.     Past Medical History:  Diagnosis Date  . ACL tear    RIGHT KNEE  . Atrial fibrillation ([place]) 2012   Rare occurrences. On ASA & Flecainide prn  . Atrial flutter ([place])    s/p ablation 2008. On Flecainide prn.  . Broken leg 1968  . Concussion 1966   History of 4  . DVT (deep venous thrombosis) ([facility])    LEFT LOWER LEG  . Dyslipidemia   . Essential hypertension, benign   . Insomnia   . Low back pain   . Obesity (BMI 30-39.9)   . Obstructive sleep apnea on CPAP    Mild  . PAF (paroxysmal atrial fibrillation) ([place])    Rare occurrences. On ASA & Flecainide prn  . Pure hypercholesterolemia    On Lipitor   . Right shoulder pain    Intermittent  . Status post ablation of atrial flutter 2008  . Umbilical hernia  Past Surgical History:  Procedure Laterality Date  . ablation for atrial flutter  01/2002  . CARDIAC CATHETERIZATION  12/26/01  . COLONOSCOPY WITH PROPOFOL N/A 03/12/2016   Procedure: COLONOSCOPY WITH PROPOFOL;  Surgeon: [name], MD;  Location: [facility];  Service: Endoscopy;  Laterality: N/A;  . coronary angiography  1997   40% stenosis  . ESOPHAGOGASTRODUODENOSCOPY (EGD) WITH PROPOFOL N/A 10/15/2016   Procedure: ESOPHAGOGASTRODUODENOSCOPY (EGD) WITH PROPOFOL;  Surgeon: [name], MD;  [facility];  Service: Endoscopy;  Laterality: N/A;  . LEFT HEART CATH AND CORONARY ANGIOGRAPHY N/A 02/11/2017    Procedure: LEFT HEART CATH AND CORONARY ANGIOGRAPHY;  Surgeon: Belva Crome, MD;  Location: [facility];  Service: Cardiovascular;  Laterality: N/A;  . right knee surgery  1996   ACL, meniscus problems  . UMBILICAL HERNIA REPAIR  04/2015     Current Outpatient Medications  Medication Sig Dispense Refill  . acetaminophen (TYLENOL) 325 MG tablet Take 325-650 mg by mouth every 6 (six) hours as needed (for pain.).    [name] aspirin EC 81 MG tablet Take 1 tablet (81 mg total) by mouth daily. 30 tablet 0  . atorvastatin (LIPITOR) 40 MG tablet Take 1 tablet (40 mg total) by mouth daily. 30 tablet 11  . cetirizine (ZYRTEC) 10 MG tablet Take 10 mg by mouth at bedtime as needed for allergies.    . Eszopiclone 3 MG TABS Take 1.5-3 mg by mouth at bedtime as needed (sleep). Take immediately before bedtime     . flecainide (TAMBOCOR) 100 MG tablet Take two tablets (200 mg) by mouth daily as needed for Afib (Patient taking differently: Take 200 mg by mouth daily as needed (for afib). Take two tablets (200 mg) by mouth daily as needed for Afib) 60 tablet 6  . Melatonin 3 MG TABS Take 1 tablet by mouth at bedtime as needed (sleep).    . Multiple Vitamins-Minerals (MULTIVITAMIN WITH MINERALS) tablet Take 1 tablet by mouth daily.      [name] olmesartan-hydrochlorothiazide (BENICAR HCT) 20-12.5 MG tablet Take 1 tablet by mouth daily. 90 tablet 3  . pantoprazole (PROTONIX) 40 MG tablet Take 1 tablet (40 mg total) by mouth 2 (two) times daily. 30 tablet 11  . rivaroxaban (XARELTO) 20 MG TABS tablet Take 1 tablet (20 mg total) by mouth daily with supper. 30 tablet 11  . nitroGLYCERIN (NITROSTAT) 0.4 MG SL tablet Place 1 tablet (0.4 mg total) under the tongue every 5 (five) minutes as needed. 25 tablet 3   No current facility-administered medications for this visit.     Allergies:   Patient has no known allergies.    Social History:  The patient  reports that  has never smoked. he has never used smokeless  tobacco. He reports that he drinks about 4.2 - 6.0 oz of alcohol per week. He reports that he does not use drugs.   Family History:  The patient's family history includes COPD in his mother; Colon cancer (age of onset: 41) in his maternal grandfather; Diabetes in his mother; Diabetes (age of onset: 71) in his brother; Heart attack in his father and paternal grandfather; Heart disease in his mother; Heart disease (age of onset: 61) in his father; Heart failure in his father; Hypertension in his father; Hypertension (age of onset: 39) in his brother; Hypertension (age of onset: 57) in his mother.    ROS:  General:no colds or fevers, no weight changes Skin:no rashes or ulcers HEENT:no blurred vision, no congestion  CV:see HPI PUL:see HPI GI:no diarrhea constipation or melena, no indigestion GU:no hematuria, no dysuria MS:no joint pain, no claudication Neuro:no syncope, no lightheadedness Endo:no diabetes, no thyroid disease  Wt Readings from Last 3 Encounters:  03/04/17 241 lb 9.6 oz (109.6 kg)  02/11/17 235 lb (106.6 kg)  01/31/17 237 lb (107.5 kg)     PHYSICAL EXAM: VS:  BP 112/74   Pulse 67   Ht 6\' 1"  (1.854 m)   Wt 241 lb 9.6 oz (109.6 kg)   SpO2 97%   BMI 31.88 kg/m  , BMI Body mass index is 31.88 kg/m. General:Pleasant affect, NAD Skin:Warm and dry, brisk capillary refill HEENT:normocephalic, sclera clear, mucus membranes moist Neck:supple, no JVD, no bruits  Heart:S1S2 RRR without murmur, gallup, rub or click Lungs:clear without rales, rhonchi, or wheezes YIF:OYDX, non tender, + BS, do not palpate liver spleen or masses Ext:no lower ext edema, 2+ pedal pulses, 2+ radial pulses Neuro:alert and oriented, MAE, follows commands, + facial symmetry    EKG:  EKG is NOT ordered today.     Recent Labs: 09/21/2016: Magnesium 2.0 01/31/2017: ALT 47; BUN 16; Creatinine, Ser 1.18; Potassium 4.2; Sodium 141 02/02/2017: Hemoglobin 15.3; Platelets 195    Lipid Panel      Component Value Date/Time   CHOL 148 01/31/2017 0758   TRIG 173 (H) 01/31/2017 0758   HDL 43 01/31/2017 0758   CHOLHDL 3.4 01/31/2017 0758   LDLCALC 70 01/31/2017 0758       Other studies Reviewed: Additional studies/ records that were reviewed today include: . Procedures   LEFT HEART CATH AND CORONARY ANGIOGRAPHY  Conclusion    Indication for study was a fixed anteroapical defect on nuclear scintigraphy raising question of silent occlusion of the LAD.  No ischemia was demonstrated on the study.  Left main widely patent.  LAD contains diffuse luminal irregularities with eccentric 40-50% proximal narrowing.  Obtuse marginal #1 contains tandem 40-50% stenoses.  Diffusely diseased right coronary with small caliber, 40-50% proximal to mid, 60-75% distal, and small diffusely diseased PDA.  Left ventricular size and function are normal.  End-diastolic pressure is normal at 15 mmHg  RECOMMENDATIONS:   In the absence of demonstrable ischemia and anginal symptoms, aggressive risk factor modification will be enhanced.  We will double statin intensity.  Continue aspirin.  Weight loss and increased plant-based components in diet.  Report any chest pain.     ASSESSMENT AND PLAN:  1.  CAD with positive nuc but stable cath.  Reviewed cath and importance of exercise, plant based diet he does continue with some chest discomfort at rest only.  May be muscular skeletal or GI is on protonix.  I will give NTG in case pain last longer than usual or severe.  Aware to be seated when taking that could decrease BP.  2.  HLD with increased lipitor PCP to recheck labs in 6 weeks.      3.  PAF happens twice a month, if it increases will follow up with Dr. Lovena Le for possible a fib ablation.  He has had a flutter ablation.  On xarelto  4. DVT hx on xarelto, some mild swelling in that leg, does wear support stocking.      Current medicines are reviewed with the patient today.  The patient Has  no concerns regarding medicines.  The following changes have been made:  See above Labs/ tests ordered today include:see above  Disposition:   FU:  see above  Signed, Cecilie Kicks, NP  03/04/2017 8:55 AM    [place], [place], [place] Phone: [phone]; Fax: [phone]

## 2017-03-04 ENCOUNTER — Ambulatory Visit (INDEPENDENT_AMBULATORY_CARE_PROVIDER_SITE_OTHER): Payer: BLUE CROSS/BLUE SHIELD | Admitting: Cardiology

## 2017-03-04 ENCOUNTER — Encounter: Payer: Self-pay | Admitting: Cardiology

## 2017-03-04 VITALS — BP 112/74 | HR 67 | Ht 73.0 in | Wt 241.6 lb

## 2017-03-04 DIAGNOSIS — I82402 Acute embolism and thrombosis of unspecified deep veins of left lower extremity: Secondary | ICD-10-CM | POA: Diagnosis not present

## 2017-03-04 DIAGNOSIS — E7800 Pure hypercholesterolemia, unspecified: Secondary | ICD-10-CM

## 2017-03-04 DIAGNOSIS — R079 Chest pain, unspecified: Secondary | ICD-10-CM | POA: Diagnosis not present

## 2017-03-04 DIAGNOSIS — I2510 Atherosclerotic heart disease of native coronary artery without angina pectoris: Secondary | ICD-10-CM

## 2017-03-04 DIAGNOSIS — I480 Paroxysmal atrial fibrillation: Secondary | ICD-10-CM | POA: Diagnosis not present

## 2017-03-04 MED ORDER — NITROGLYCERIN 0.4 MG SL SUBL: 0.4000 mg | SUBLINGUAL_TABLET | SUBLINGUAL | 3 refills | Status: DC | PRN

## 2017-03-04 NOTE — Patient Instructions (Addendum)
Medication Instructions:  Your physician has recommended you make the following change in your medication:  1.  START Nitroglycerin 0.4 s/l tablets USE AS DIRECTED ON THE BOTTLE.   Labwork: None ordered  Testing/Procedures: None ordered  Follow-Up: Your physician wants you to follow-up in: [place] will receive a reminder letter in the mail two months in advance. If you don't receive a letter, please call our office to schedule the follow-up appointment.    Any Other Special Instructions Will Be Listed Below (If Applicable).     If you need a refill on your cardiac medications before your next appointment, please call your pharmacy.

## 2017-04-01 DIAGNOSIS — I82402 Acute embolism and thrombosis of unspecified deep veins of left lower extremity: Secondary | ICD-10-CM | POA: Diagnosis not present

## 2017-04-01 DIAGNOSIS — R7301 Impaired fasting glucose: Secondary | ICD-10-CM | POA: Diagnosis not present

## 2017-04-01 DIAGNOSIS — E785 Hyperlipidemia, unspecified: Secondary | ICD-10-CM | POA: Diagnosis not present

## 2017-04-01 DIAGNOSIS — Z125 Encounter for screening for malignant neoplasm of prostate: Secondary | ICD-10-CM | POA: Diagnosis not present

## 2017-04-01 DIAGNOSIS — Z0000 Encounter for general adult medical examination without abnormal findings: Secondary | ICD-10-CM | POA: Diagnosis not present

## 2017-04-01 DIAGNOSIS — Z23 Encounter for immunization: Secondary | ICD-10-CM | POA: Diagnosis not present

## 2017-04-01 DIAGNOSIS — I4891 Unspecified atrial fibrillation: Secondary | ICD-10-CM | POA: Diagnosis not present

## 2017-04-13 ENCOUNTER — Telehealth: Payer: Self-pay | Admitting: *Deleted

## 2017-04-13 DIAGNOSIS — R748 Abnormal levels of other serum enzymes: Secondary | ICD-10-CM

## 2017-04-13 NOTE — Telephone Encounter (Signed)
-----   Message from Loren Racer, LPN sent at 22/17/9810  1:32 PM EST ----- Regarding: liver panel Needs 3 month liver panel repeated.  Last one was 01/31/17

## 2017-04-13 NOTE — Telephone Encounter (Signed)
Called pt to schedule f/u liver panel.  Left detailed message advising pt to call back and schedule a lab appt.

## 2017-04-15 DIAGNOSIS — Z23 Encounter for immunization: Secondary | ICD-10-CM | POA: Diagnosis not present

## 2017-04-21 NOTE — Telephone Encounter (Signed)
Spoke with pt's wife, DPR on file.  She states pt is out of town right now but will return tonight.  She states she will have him call tomorrow to make lab appt.  Lab order for Hepatic panel is in, just needs scheduling.

## 2017-04-22 ENCOUNTER — Other Ambulatory Visit: Payer: BLUE CROSS/BLUE SHIELD | Admitting: *Deleted

## 2017-04-22 DIAGNOSIS — R748 Abnormal levels of other serum enzymes: Secondary | ICD-10-CM | POA: Diagnosis not present

## 2017-04-22 NOTE — Telephone Encounter (Signed)
Spoke with pt and scheduled him labs this afternoon.

## 2017-04-22 NOTE — Telephone Encounter (Signed)
Follow Up:; ° ° °Returning your call. °

## 2017-04-23 LAB — HEPATIC FUNCTION PANEL
ALT: 42 IU/L (ref 0–44)
AST: 28 IU/L (ref 0–40)
Albumin: 4.3 g/dL (ref 3.6–4.8)
Alkaline Phosphatase: 81 IU/L (ref 39–117)
BILIRUBIN TOTAL: 0.6 mg/dL (ref 0.0–1.2)
BILIRUBIN, DIRECT: 0.17 mg/dL (ref 0.00–0.40)
Total Protein: 7.2 g/dL (ref 6.0–8.5)

## 2017-04-25 ENCOUNTER — Telehealth: Payer: Self-pay | Admitting: Interventional Cardiology

## 2017-04-25 NOTE — Telephone Encounter (Signed)
New Message     Patient was returning [name] call from earlier

## 2017-04-25 NOTE — Telephone Encounter (Signed)
Spoke with pt about lab results.  Pt appreciative for call.

## 2017-06-06 DIAGNOSIS — D485 Neoplasm of uncertain behavior of skin: Secondary | ICD-10-CM | POA: Diagnosis not present

## 2017-06-06 DIAGNOSIS — Z85828 Personal history of other malignant neoplasm of skin: Secondary | ICD-10-CM | POA: Diagnosis not present

## 2017-06-06 DIAGNOSIS — L280 Lichen simplex chronicus: Secondary | ICD-10-CM | POA: Diagnosis not present

## 2017-06-06 DIAGNOSIS — L570 Actinic keratosis: Secondary | ICD-10-CM | POA: Diagnosis not present

## 2017-06-06 DIAGNOSIS — D1801 Hemangioma of skin and subcutaneous tissue: Secondary | ICD-10-CM | POA: Diagnosis not present

## 2017-06-06 DIAGNOSIS — L821 Other seborrheic keratosis: Secondary | ICD-10-CM | POA: Diagnosis not present

## 2017-06-06 DIAGNOSIS — L309 Dermatitis, unspecified: Secondary | ICD-10-CM | POA: Diagnosis not present

## 2017-06-10 DIAGNOSIS — Z23 Encounter for immunization: Secondary | ICD-10-CM | POA: Diagnosis not present

## 2017-07-14 DIAGNOSIS — H40013 Open angle with borderline findings, low risk, bilateral: Secondary | ICD-10-CM | POA: Diagnosis not present

## 2017-07-14 DIAGNOSIS — H40053 Ocular hypertension, bilateral: Secondary | ICD-10-CM | POA: Diagnosis not present

## 2017-08-01 ENCOUNTER — Telehealth: Payer: Self-pay

## 2017-08-01 NOTE — Telephone Encounter (Signed)
I did a Pantoprazole PA over the phone with Lucky Rathke at [facility]. Per Lucky Rathke this PA has been approved from today until 08/01/18. Reference# [id number]   While I was typing this note the approval letter from [facility] came through the fax machine. I have faxed approval letter to [facility].

## 2017-09-26 DIAGNOSIS — I10 Essential (primary) hypertension: Secondary | ICD-10-CM | POA: Diagnosis not present

## 2017-09-26 DIAGNOSIS — I4891 Unspecified atrial fibrillation: Secondary | ICD-10-CM | POA: Diagnosis not present

## 2017-09-26 DIAGNOSIS — E785 Hyperlipidemia, unspecified: Secondary | ICD-10-CM | POA: Diagnosis not present

## 2017-09-26 DIAGNOSIS — E119 Type 2 diabetes mellitus without complications: Secondary | ICD-10-CM | POA: Diagnosis not present

## 2017-10-02 NOTE — Progress Notes (Signed)
Cardiology Office Note:    Date:  10/05/2017   ID:  [name], [name] Sep 19, 1950, MRN [id number]  PCP:  London Pepper, MD  Cardiologist:  No primary care provider on file.   Referring MD: London Pepper, MD   Chief Complaint  Patient presents with  . Atrial Fibrillation  . Coronary Artery Disease    History of Present Illness:    Carlos Gutierrez is a 67 y.o. male with a hx of paroxysmal atrial fibrillation (PRN Pill-in-pocket Flecainide) w/ CHADSVASC score of1 (will be 2 next January when he turns 26) recently admitted for atypical chest pain with 3negative troponin's andnonischemic EKG. D-dimer was mildly elevated. Chest CT ordered to exclude PE given his extensive air travel. CT of chest negative for PE, however LE venous dopplers confirmed DVT. He was started on Xarelto, 15 mg BID x 3 weeks, followed by 20 mg daily thereafter.   He is doing well.  2-3 episodes of atrial fibrillation have occurred in the last 6 months.  He has not had angina, orthopnea, PND, edema, or neurological complaints.  He denies bleeding on anticoagulation therapy.  Recently identified to have an elevated hemoglobin A1c.   Past Medical History:  Diagnosis Date  . ACL tear    RIGHT KNEE  . Atrial fibrillation ([place]) 2012   Rare occurrences. On ASA & Flecainide prn  . Atrial flutter ([place])    s/p ablation 2008. On Flecainide prn.  . Broken leg 1968  . Concussion 1966   History of 4  . DVT (deep venous thrombosis) ([facility])    LEFT LOWER LEG  . Dyslipidemia   . Essential hypertension, benign   . Insomnia   . Low back pain   . Obesity (BMI 30-39.9)   . Obstructive sleep apnea on CPAP    Mild  . PAF (paroxysmal atrial fibrillation) ([place])    Rare occurrences. On ASA & Flecainide prn  . Pure hypercholesterolemia    On Lipitor   . Right shoulder pain    Intermittent  . Status post ablation of atrial flutter 2008  . Umbilical hernia     Past Surgical History:  Procedure Laterality Date  .  ablation for atrial flutter  01/2002  . CARDIAC CATHETERIZATION  12/26/01  . COLONOSCOPY WITH PROPOFOL N/A 03/12/2016   Procedure: COLONOSCOPY WITH PROPOFOL;  Surgeon: [name], MD;  [facility];  Service: Endoscopy;  Laterality: N/A;  . coronary angiography  1997   40% stenosis  . ESOPHAGOGASTRODUODENOSCOPY (EGD) WITH PROPOFOL N/A 10/15/2016   Procedure: ESOPHAGOGASTRODUODENOSCOPY (EGD) WITH PROPOFOL;  Surgeon: [name], MD;  [facility];  Service: Endoscopy;  Laterality: N/A;  . LEFT HEART CATH AND CORONARY ANGIOGRAPHY N/A 02/11/2017   Procedure: LEFT HEART CATH AND CORONARY ANGIOGRAPHY;  Surgeon: Belva Crome, MD;  Location: [facility];  Service: Cardiovascular;  Laterality: N/A;  . right knee surgery  1996   ACL, meniscus problems  . UMBILICAL HERNIA REPAIR  04/2015    Current Medications: Current Meds  Medication Sig  . acetaminophen (TYLENOL) 325 MG tablet Take 325-650 mg by mouth every 6 (six) hours as needed (for pain.).  [name] aspirin EC 81 MG tablet Take 1 tablet (81 mg total) by mouth daily.  [name] atorvastatin (LIPITOR) 40 MG tablet Take 1 tablet (40 mg total) by mouth daily.  . cetirizine (ZYRTEC) 10 MG tablet Take 10 mg by mouth at bedtime as needed for allergies.  . Eszopiclone 3 MG TABS Take 1.5-3 mg  by mouth at bedtime as needed (sleep). Take immediately before bedtime   . flecainide (TAMBOCOR) 100 MG tablet Take two tablets (200 mg) by mouth daily as needed for Afib (Patient taking differently: Take 200 mg by mouth daily as needed (for afib). Take two tablets (200 mg) by mouth daily as needed for Afib)  . glipiZIDE (GLUCOTROL XL) 2.5 MG 24 hr tablet Take 2.5 mg by mouth daily with breakfast.  . Melatonin 3 MG TABS Take 1 tablet by mouth at bedtime as needed (sleep).  . Multiple Vitamins-Minerals (MULTIVITAMIN WITH MINERALS) tablet Take 1 tablet by mouth daily.    [name] olmesartan-hydrochlorothiazide (BENICAR HCT) 20-12.5 MG tablet Take 1 tablet  by mouth daily.  . pantoprazole (PROTONIX) 40 MG tablet Take 1 tablet (40 mg total) by mouth 2 (two) times daily.  . rivaroxaban (XARELTO) 20 MG TABS tablet Take 1 tablet (20 mg total) by mouth daily with supper.     Allergies:   Patient has no known allergies.   Social History   Socioeconomic History  . Marital status: Married    Spouse name: Not on file  . Number of children: 2  . Years of education: Not on file  . Highest education level: Not on file  Occupational History  . Occupation: Scientist, clinical (histocompatibility and immunogenetics): [employer]  . Financial resource strain: Not on file  . Food insecurity:    Worry: Not on file    Inability: Not on file  . Transportation needs:    Medical: Not on file    Non-medical: Not on file  Tobacco Use  . Smoking status: Never Smoker  . Smokeless tobacco: Never Used  Substance and Sexual Activity  . Alcohol use: Yes    Alcohol/week: 4.2 - 6.0 oz    Types: 7 - 10 Glasses of wine per week  . Drug use: No  . Sexual activity: Not on file  Lifestyle  . Physical activity:    Days per week: Not on file    Minutes per session: Not on file  . Stress: Not on file  Relationships  . Social connections:    Talks on phone: Not on file    Gets together: Not on file    Attends religious service: Not on file    Active member of club or organization: Not on file    Attends meetings of clubs or organizations: Not on file    Relationship status: Not on file  Other Topics Concern  . Not on file  Social History Narrative   Caffeine: yes, coffee 2-3 cups daily, tea-rare.    Exercise-yes, 2-3X weekly, jog/run.    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    Marital Status: Married.    Children: 2 children-grown                 Family History: The patient's family history includes COPD in his mother; Colon cancer (age of onset: 29) in his maternal grandfather; Diabetes in his mother;  Diabetes (age of onset: 48) in his brother; Heart attack in his father and paternal grandfather; Heart disease in his mother; Heart disease (age of onset: 82) in his father; Heart failure in his father; Hypertension in his father; Hypertension (age of onset: 9) in his brother; Hypertension (age of onset: 20) in his mother. There is no history of Stroke.  ROS:   Please see the history of present illness.    Increased  weight.  All other systems reviewed and are negative.  EKGs/Labs/Other Studies Reviewed:    The following studies were reviewed today: Cardiac catheterization 02/11/2017: Diagnostic Diagram        EKG:  EKG is not ordered today.    Recent Labs: 01/31/2017: BUN 16; Creatinine, Ser 1.18; Potassium 4.2; Sodium 141 02/02/2017: Hemoglobin 15.3; Platelets 195 04/22/2017: ALT 42  Recent Lipid Panel    Component Value Date/Time   CHOL 148 01/31/2017 0758   TRIG 173 (H) 01/31/2017 0758   HDL 43 01/31/2017 0758   CHOLHDL 3.4 01/31/2017 0758   LDLCALC 70 01/31/2017 0758    Physical Exam:    VS:  BP 120/76   Pulse 61   Ht 6\' 1"  (1.854 m)   Wt 231 lb 6.4 oz (105 kg)   BMI 30.53 kg/m     Wt Readings from Last 3 Encounters:  10/05/17 231 lb 6.4 oz (105 kg)  03/04/17 241 lb 9.6 oz (109.6 kg)  02/11/17 235 lb (106.6 kg)     GEN:  Well nourished, well developed in no acute distress HEENT: Normal NECK: No JVD. LYMPHATICS: No lymphadenopathy CARDIAC: RRR, no murmur, no gallop, no edema. VASCULAR: 2+ radial and posterior tibial pulses.  No bruits. RESPIRATORY:  Clear to auscultation without rales, wheezing or rhonchi  ABDOMEN: Soft, non-tender, non-distended, No pulsatile mass, MUSCULOSKELETAL: No deformity  SKIN: Warm and dry NEUROLOGIC:  Alert and oriented x 3 PSYCHIATRIC:  Normal affect   ASSESSMENT:    1. Atrial fibrillation, unspecified type ([place])   2. CAD in native artery   3. Essential hypertension   4. Anticoagulant long-term use   5. Obstructive sleep  apnea on CPAP    PLAN:    In order of problems listed above:  1. Continue flecainide pill in the pocket for atrial fibrillation.  Symptoms of atrial fibrillation or dizziness and palpitations that the patient readily recognizes. 2. Asymptomatic coronary artery disease.  Plan is aggressive risk factor modification: LDL less than 70, A1c less than 7.0, blood pressure 130/80 mmHg, 150 minutes of moderate aerobic activity per week. 3. Target blood pressure 130/80 mmHg 4. Continue Xarelto for stroke prophylaxis related to atrial fibrillation.  Clinical follow-up in 1 year   Medication Adjustments/Labs and Tests Ordered: Current medicines are reviewed at length with the patient today.  Concerns regarding medicines are outlined above.  Orders Placed This Encounter  Procedures  . Lipid Profile  . Basic metabolic panel  . CBC   No orders of the defined types were placed in this encounter.   Patient Instructions  Medication Instructions:  Your physician recommends that you continue on your current medications as directed. Please refer to the Current Medication list given to you today.  Labwork: Lipid, BMET and CBC today  Testing/Procedures: None  Follow-Up: Your physician wants you to follow-up in: 1 year with Dr. Tamala Julian.  You will receive a reminder letter in the mail two months in advance. If you don't receive a letter, please call our office to schedule the follow-up appointment.   Any Other Special Instructions Will Be Listed Below (If Applicable).     If you need a refill on your cardiac medications before your next appointment, please call your pharmacy.      Signed, Sinclair Grooms, MD  10/05/2017 9:12 AM    [place]

## 2017-10-05 ENCOUNTER — Encounter

## 2017-10-05 ENCOUNTER — Encounter: Payer: Self-pay | Admitting: Interventional Cardiology

## 2017-10-05 ENCOUNTER — Ambulatory Visit: Payer: BLUE CROSS/BLUE SHIELD | Admitting: Interventional Cardiology

## 2017-10-05 VITALS — BP 120/76 | HR 61 | Ht 73.0 in | Wt 231.4 lb

## 2017-10-05 DIAGNOSIS — Z7901 Long term (current) use of anticoagulants: Secondary | ICD-10-CM | POA: Diagnosis not present

## 2017-10-05 DIAGNOSIS — I4891 Unspecified atrial fibrillation: Secondary | ICD-10-CM | POA: Diagnosis not present

## 2017-10-05 DIAGNOSIS — I2510 Atherosclerotic heart disease of native coronary artery without angina pectoris: Secondary | ICD-10-CM | POA: Diagnosis not present

## 2017-10-05 DIAGNOSIS — I10 Essential (primary) hypertension: Secondary | ICD-10-CM

## 2017-10-05 DIAGNOSIS — G4733 Obstructive sleep apnea (adult) (pediatric): Secondary | ICD-10-CM

## 2017-10-05 DIAGNOSIS — Z9989 Dependence on other enabling machines and devices: Secondary | ICD-10-CM

## 2017-10-05 LAB — BASIC METABOLIC PANEL
BUN/Creatinine Ratio: 11 (ref 10–24)
BUN: 15 mg/dL (ref 8–27)
CALCIUM: 9.9 mg/dL (ref 8.6–10.2)
CO2: 24 mmol/L (ref 20–29)
CREATININE: 1.33 mg/dL — AB (ref 0.76–1.27)
Chloride: 101 mmol/L (ref 96–106)
GFR, EST AFRICAN AMERICAN: 64 mL/min/{1.73_m2} (ref >59)
GFR, EST NON AFRICAN AMERICAN: 56 mL/min/{1.73_m2} — AB (ref >59)
Glucose: 143 mg/dL — ABNORMAL HIGH (ref 65–99)
POTASSIUM: 4.5 mmol/L (ref 3.5–5.2)
Sodium: 141 mmol/L (ref 134–144)

## 2017-10-05 LAB — CBC
HEMATOCRIT: 47 % (ref 37.5–51.0)
Hemoglobin: 15.9 g/dL (ref 13.0–17.7)
MCH: 30.2 pg (ref 26.6–33.0)
MCHC: 33.8 g/dL (ref 31.5–35.7)
MCV: 89 fL (ref 79–97)
Platelets: 190 10*3/uL (ref 150–450)
RBC: 5.27 x10E6/uL (ref 4.14–5.80)
RDW: 14.6 % (ref 12.3–15.4)
WBC: 5.5 10*3/uL (ref 3.4–10.8)

## 2017-10-05 LAB — LIPID PANEL
CHOLESTEROL TOTAL: 133 mg/dL (ref 100–199)
Chol/HDL Ratio: 3 ratio (ref 0.0–5.0)
HDL: 45 mg/dL (ref >39)
LDL CALC: 70 mg/dL (ref 0–99)
TRIGLYCERIDES: 90 mg/dL (ref 0–149)
VLDL Cholesterol Cal: 18 mg/dL (ref 5–40)

## 2017-10-05 NOTE — Patient Instructions (Signed)
Medication Instructions:  Your physician recommends that you continue on your current medications as directed. Please refer to the Current Medication list given to you today.  Labwork: Lipid, BMET and CBC today  Testing/Procedures: None  Follow-Up: Your physician wants you to follow-up in: 1 year with Dr. Tamala Julian.  You will receive a reminder letter in the mail two months in advance. If you don't receive a letter, please call our office to schedule the follow-up appointment.   Any Other Special Instructions Will Be Listed Below (If Applicable).     If you need a refill on your cardiac medications before your next appointment, please call your pharmacy.

## 2017-10-11 DIAGNOSIS — M19012 Primary osteoarthritis, left shoulder: Secondary | ICD-10-CM | POA: Diagnosis not present

## 2017-10-11 DIAGNOSIS — M19011 Primary osteoarthritis, right shoulder: Secondary | ICD-10-CM | POA: Diagnosis not present

## 2017-11-07 ENCOUNTER — Other Ambulatory Visit: Payer: Self-pay | Admitting: Cardiology

## 2017-11-08 NOTE — Telephone Encounter (Signed)
Pt saw Dr Tamala Julian on 10/05/17. Age 67 yrs old. Weight at visit was 105Kg. SCr was 1.33. CrCl is 43mL/min. Will refill Xarelto 20mg  QD.

## 2017-11-15 ENCOUNTER — Other Ambulatory Visit: Payer: Self-pay | Admitting: Physician Assistant

## 2017-11-15 ENCOUNTER — Other Ambulatory Visit: Payer: Self-pay | Admitting: Cardiology

## 2017-11-15 DIAGNOSIS — I480 Paroxysmal atrial fibrillation: Secondary | ICD-10-CM

## 2017-12-13 ENCOUNTER — Other Ambulatory Visit: Payer: Self-pay

## 2017-12-13 MED ORDER — OLMESARTAN MEDOXOMIL-HCTZ 20-12.5 MG PO TABS: 1.0000 | ORAL_TABLET | Freq: Every day | ORAL | 2 refills | Status: DC

## 2017-12-16 ENCOUNTER — Telehealth: Payer: Self-pay | Admitting: Interventional Cardiology

## 2017-12-16 MED ORDER — OLMESARTAN MEDOXOMIL 20 MG PO TABS: 20.0000 mg | ORAL_TABLET | Freq: Every day | ORAL | 1 refills | Status: DC

## 2017-12-16 MED ORDER — HYDROCHLOROTHIAZIDE 12.5 MG PO CAPS: 12.5000 mg | ORAL_CAPSULE | Freq: Every day | ORAL | 1 refills | Status: DC

## 2017-12-16 NOTE — Telephone Encounter (Signed)
Patient was taking olmesartan-hctz 20-12.5 mg QD. [facility] and states that the combination pill is on back order and wants to know if it is okay to split Rx into 2 pills. Rx sent in for olmesartan 20 mg QD and hctz 12.5 mg QD. Med list updated.

## 2017-12-16 NOTE — Telephone Encounter (Signed)
Pt's pharmacy [facility] is stating that pt's medication olmesartan-hydrochlorothiazide 20-12.5 mg tablet is on backorder and pharmacy would like to know if Dr. Tamala Julian would like to split the medication into two different medications so pt can get that medications now. Please address

## 2018-01-01 DIAGNOSIS — H01006 Unspecified blepharitis left eye, unspecified eyelid: Secondary | ICD-10-CM | POA: Diagnosis not present

## 2018-01-03 DIAGNOSIS — L570 Actinic keratosis: Secondary | ICD-10-CM | POA: Diagnosis not present

## 2018-01-03 DIAGNOSIS — L84 Corns and callosities: Secondary | ICD-10-CM | POA: Diagnosis not present

## 2018-01-03 DIAGNOSIS — L821 Other seborrheic keratosis: Secondary | ICD-10-CM | POA: Diagnosis not present

## 2018-01-03 DIAGNOSIS — D1801 Hemangioma of skin and subcutaneous tissue: Secondary | ICD-10-CM | POA: Diagnosis not present

## 2018-01-03 DIAGNOSIS — D225 Melanocytic nevi of trunk: Secondary | ICD-10-CM | POA: Diagnosis not present

## 2018-02-15 DIAGNOSIS — H35363 Drusen (degenerative) of macula, bilateral: Secondary | ICD-10-CM | POA: Diagnosis not present

## 2018-02-22 DIAGNOSIS — L570 Actinic keratosis: Secondary | ICD-10-CM | POA: Diagnosis not present

## 2018-04-06 ENCOUNTER — Other Ambulatory Visit: Payer: Self-pay | Admitting: Interventional Cardiology

## 2018-04-07 DIAGNOSIS — K219 Gastro-esophageal reflux disease without esophagitis: Secondary | ICD-10-CM | POA: Diagnosis not present

## 2018-04-07 DIAGNOSIS — Z0000 Encounter for general adult medical examination without abnormal findings: Secondary | ICD-10-CM | POA: Diagnosis not present

## 2018-04-07 DIAGNOSIS — I10 Essential (primary) hypertension: Secondary | ICD-10-CM | POA: Diagnosis not present

## 2018-04-07 DIAGNOSIS — I4891 Unspecified atrial fibrillation: Secondary | ICD-10-CM | POA: Diagnosis not present

## 2018-04-07 DIAGNOSIS — G4733 Obstructive sleep apnea (adult) (pediatric): Secondary | ICD-10-CM | POA: Diagnosis not present

## 2018-04-07 DIAGNOSIS — Z125 Encounter for screening for malignant neoplasm of prostate: Secondary | ICD-10-CM | POA: Diagnosis not present

## 2018-04-07 DIAGNOSIS — E785 Hyperlipidemia, unspecified: Secondary | ICD-10-CM | POA: Diagnosis not present

## 2018-04-07 DIAGNOSIS — E119 Type 2 diabetes mellitus without complications: Secondary | ICD-10-CM | POA: Diagnosis not present

## 2018-04-19 DIAGNOSIS — Z23 Encounter for immunization: Secondary | ICD-10-CM | POA: Diagnosis not present

## 2018-04-20 DIAGNOSIS — L570 Actinic keratosis: Secondary | ICD-10-CM | POA: Diagnosis not present

## 2018-06-14 IMAGING — CT CT ANGIO CHEST
2 of 8 series · 19 of 46 positions shown · IV contrast (isovue)
Comparison: None.

CLINICAL DATA: Chest pain

EXAM:
CT ANGIOGRAPHY CHEST WITH CONTRAST
TECHNIQUE: Multidetector CT imaging of the chest was performed using the
standard protocol during bolus administration of intravenous
contrast. Multiplanar CT image reconstructions and MIPs were
obtained to evaluate the vascular anatomy.
CONTRAST:  100 mL Isovue 370

[Series 6: thins · axial · 0.81mm/px · z∈[+1141,+1481]mm · 16 of 376 slices shown]
[im 18/376  lung]
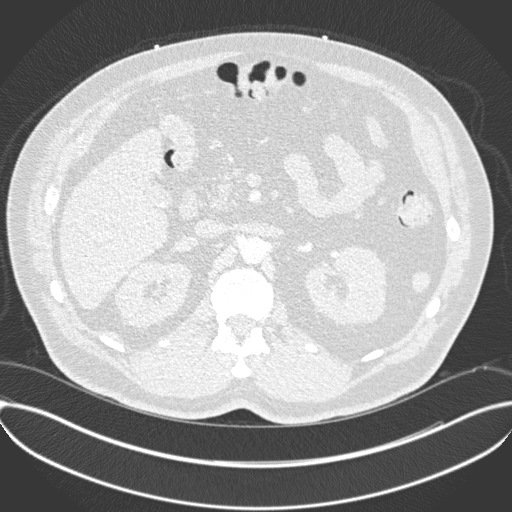
[im 35/376  soft-tissue]
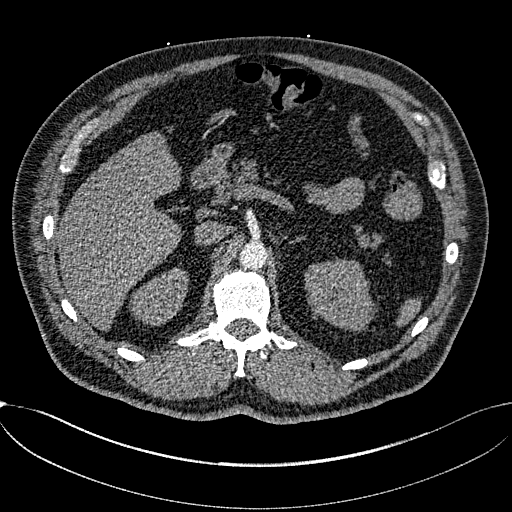
[im 69/376  lung]
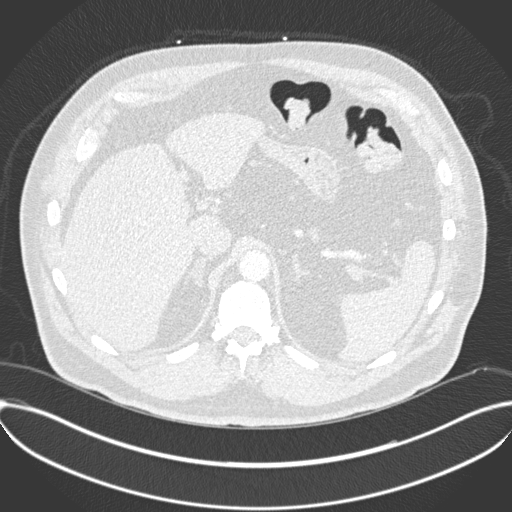
[im 86/376  soft-tissue]
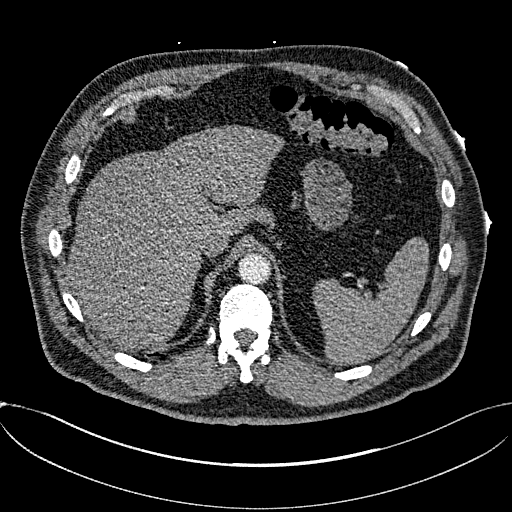
[im 103/376  lung]
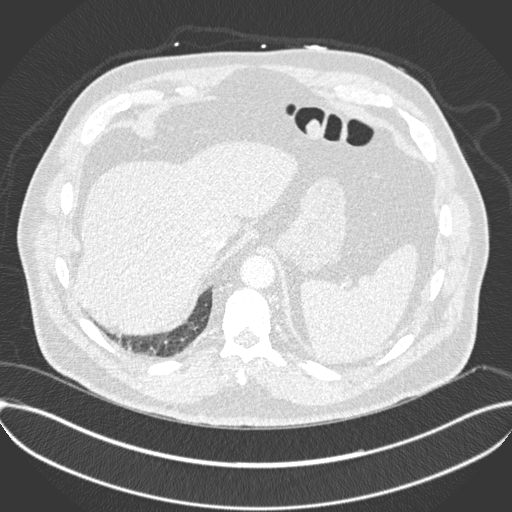
[im 137/376  soft-tissue]
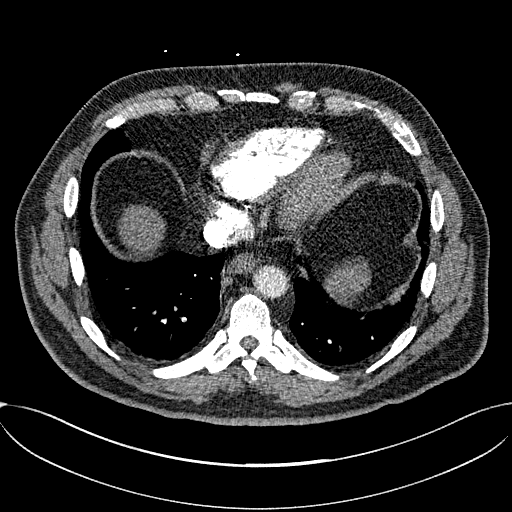
[im 154/376  lung]
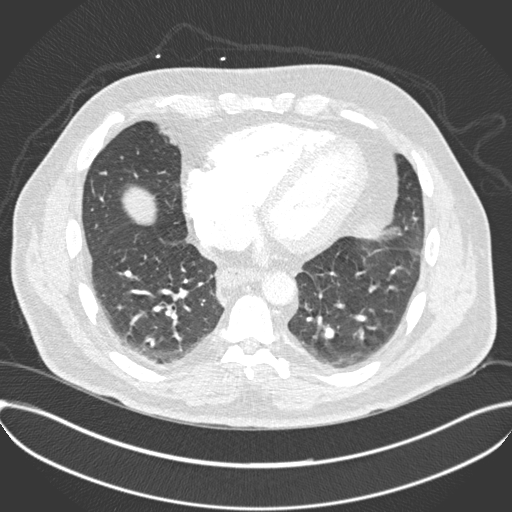
[im 171/376  soft-tissue]
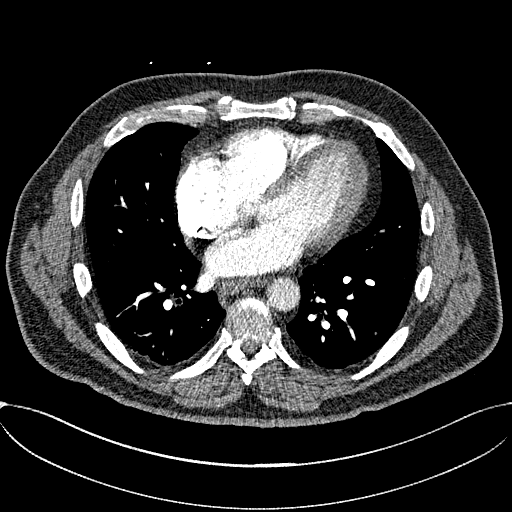
[im 205/376  lung]
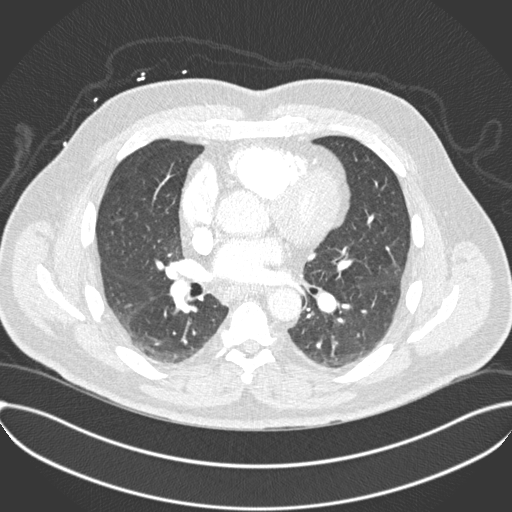
[im 222/376  soft-tissue]
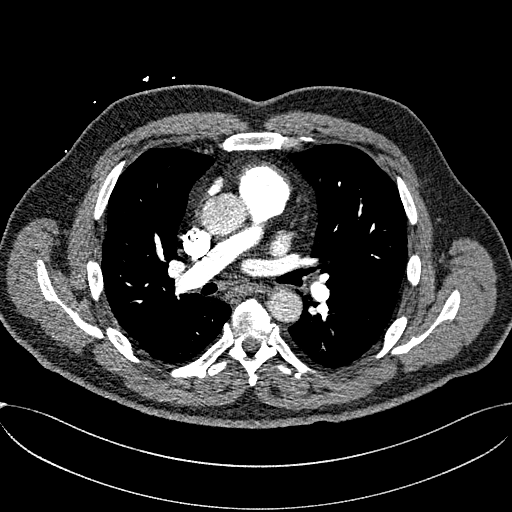
[im 239/376  lung]
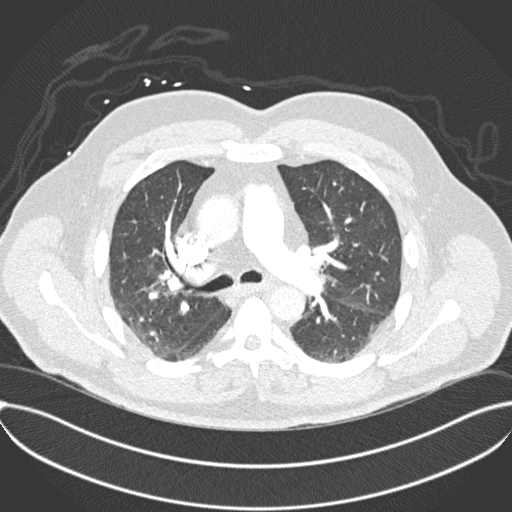
[im 273/376  soft-tissue]
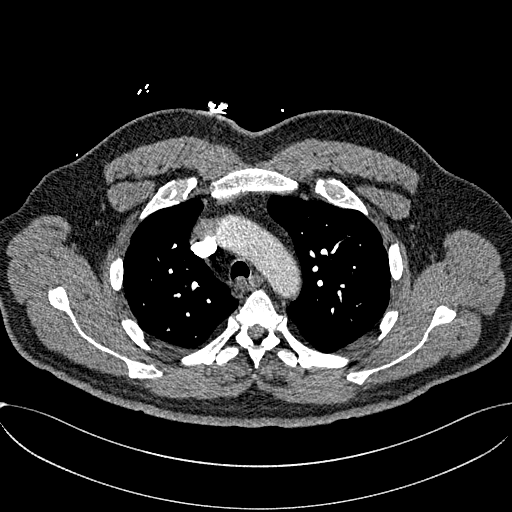
[im 290/376  lung]
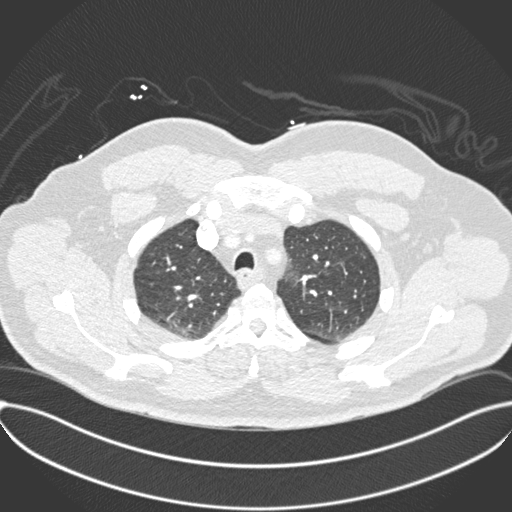
[im 307/376  soft-tissue]
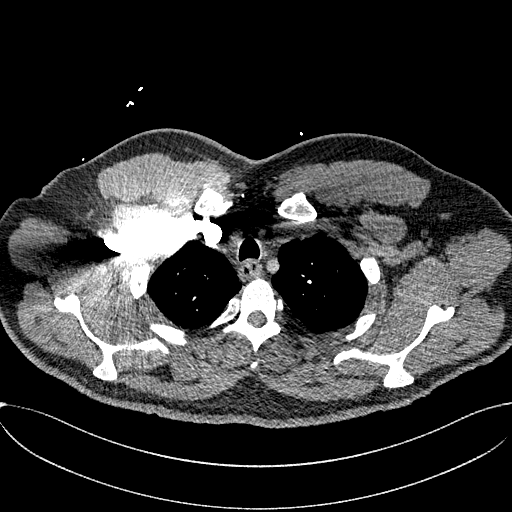
[im 341/376  lung]
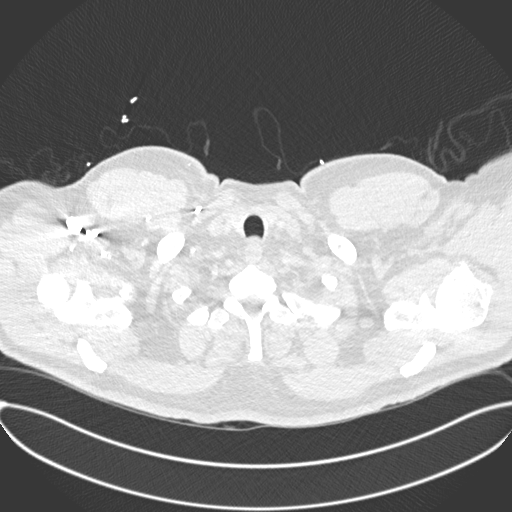
[im 358/376  soft-tissue]
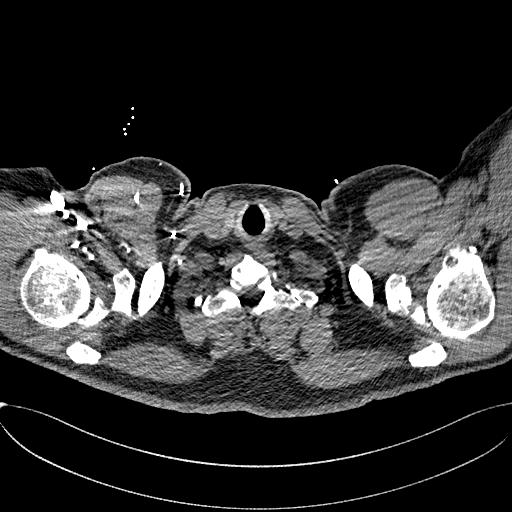

[Series 8: coronal mpr · coronal · 0.73mm/px · 3 of 148 slices shown]
[im 37/148  soft-tissue]
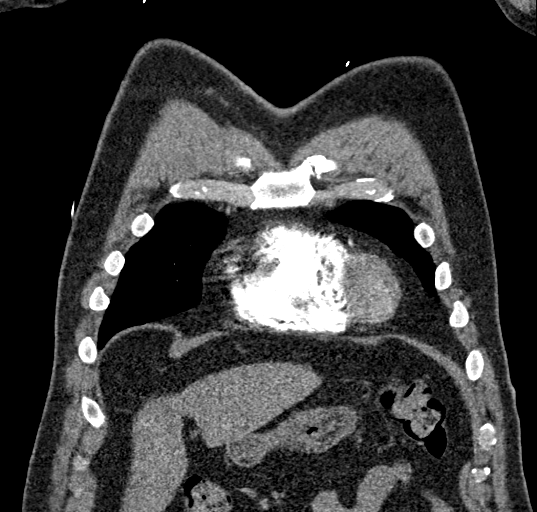
[im 74/148  soft-tissue]
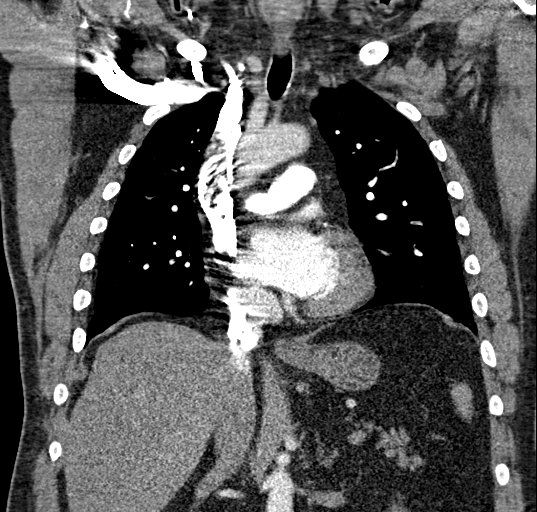
[im 111/148  soft-tissue]
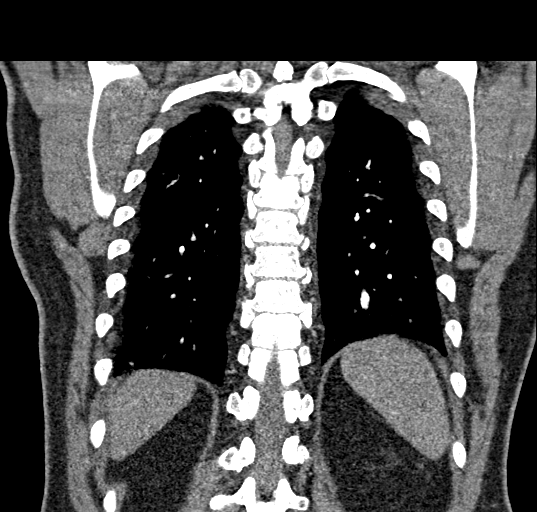

[19 of 46 positions shown; findings below may reference images not displayed]

FINDINGS: Cardiovascular: Satisfactory opacification of the pulmonary arteries
to the segmental level. No evidence of pulmonary embolism. Normal
heart size. No pericardial effusion. Thoracic aorta is normal in
caliber. No thoracic aortic dissection. Coronary artery
atherosclerosis in the LAD, circumflex and RCA.

Mediastinum/Nodes: No enlarged mediastinal, hilar, or axillary lymph
nodes. Thyroid gland, trachea, and esophagus demonstrate no
significant findings.

Lungs/Pleura: Lungs are clear. No pleural effusion or pneumothorax.

Upper Abdomen: No acute abnormality.

Musculoskeletal: No chest wall abnormality. No acute or significant
osseous findings.

Review of the MIP images confirms the above findings.
IMPRESSION: 1. No evidence of pulmonary embolus.
2. No thoracic aortic aneurysm or dissection.

## 2018-06-15 IMAGING — DX DG SHOULDER 2+V*R*
3 series · 3 of 3 positions shown · non-contrast
Comparison: 09/21/2016

CLINICAL DATA: Right shoulder pain following lifting injury

EXAM:
RIGHT SHOULDER - 2+ VIEW

[w shoulder external right (1 of 2)]
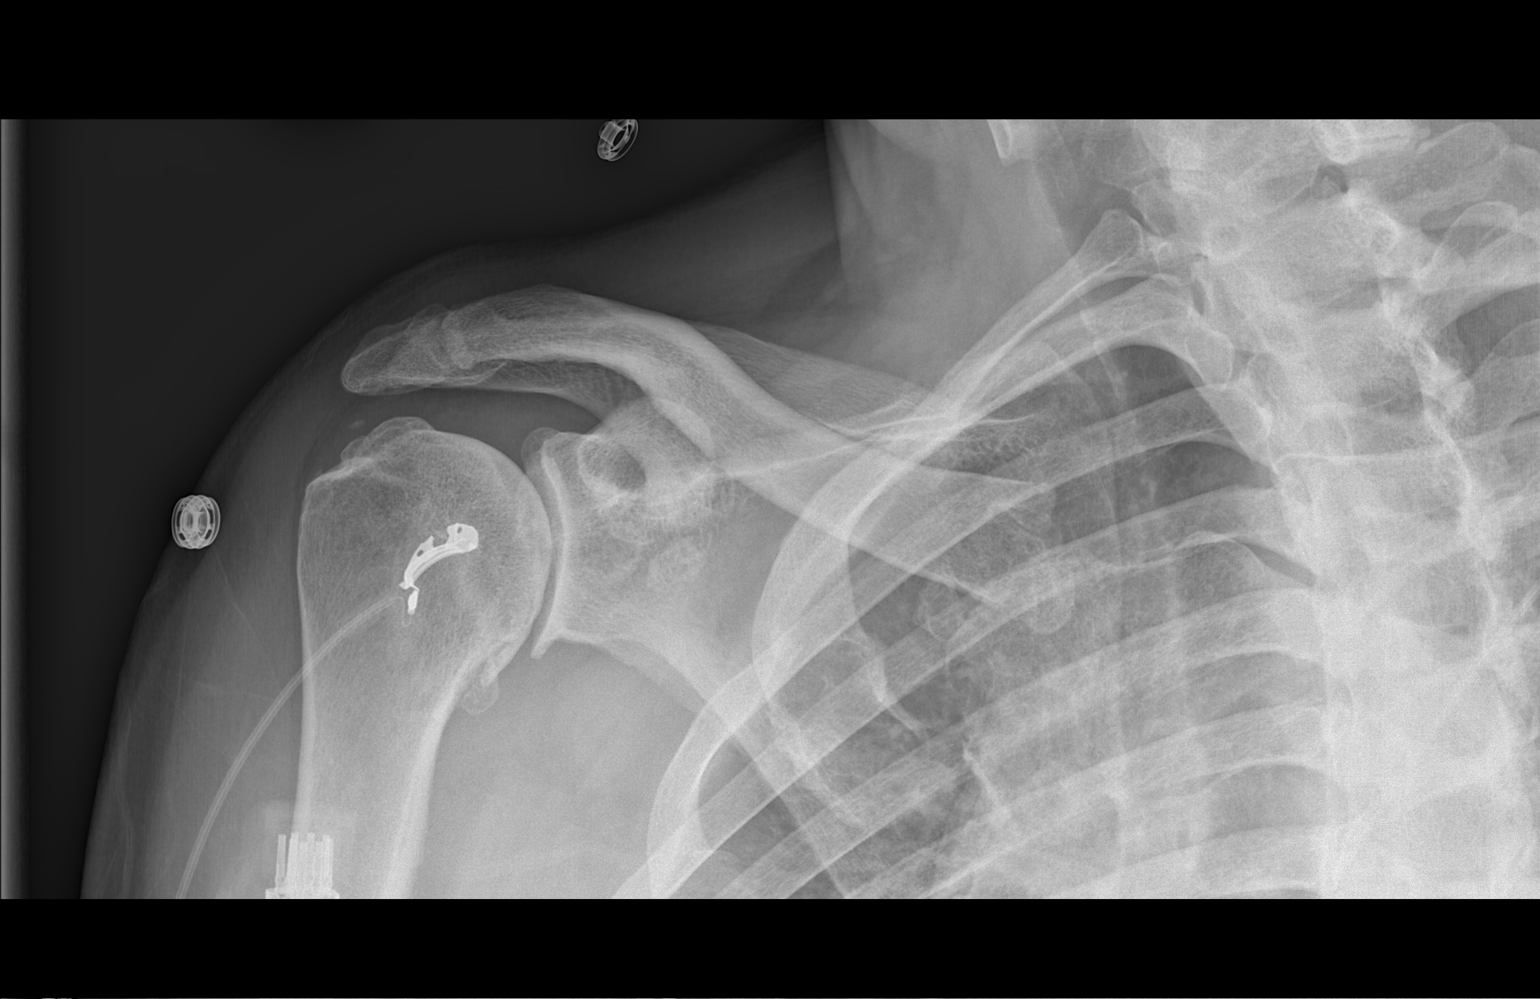

[w shoulder y-view right]
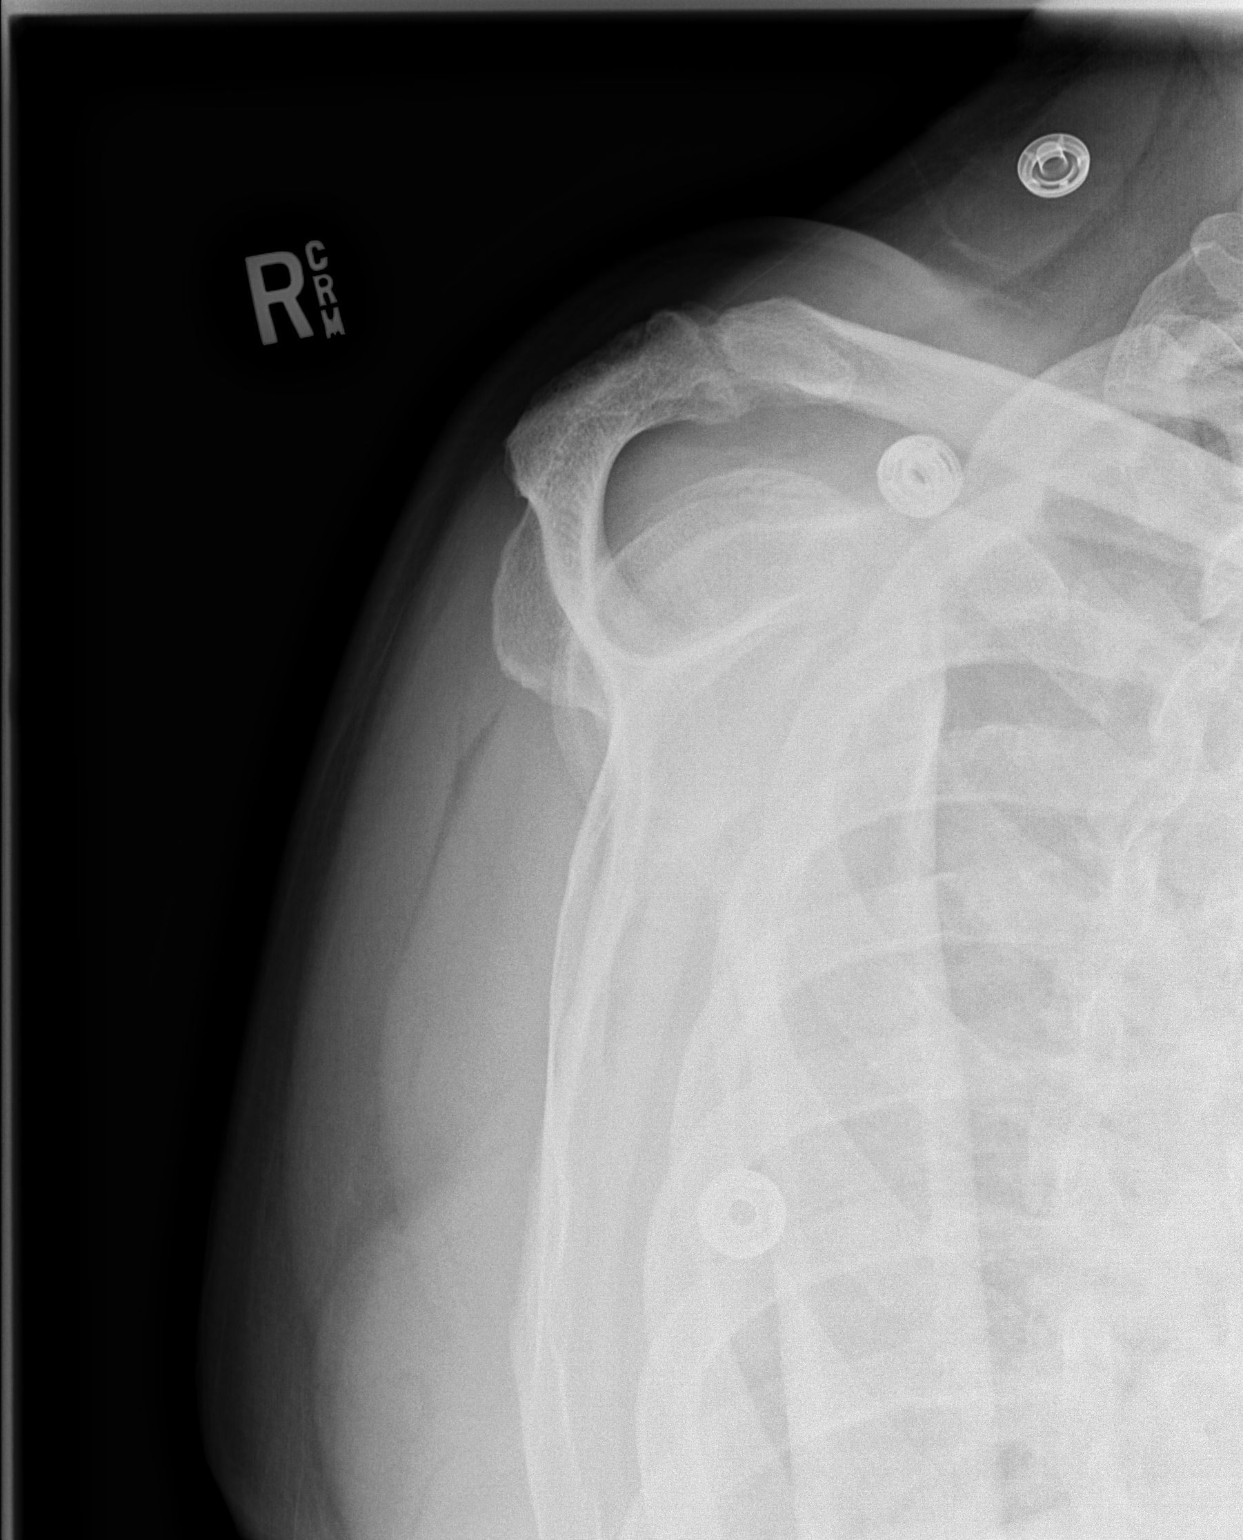

[w shoulder external right (2 of 2)]
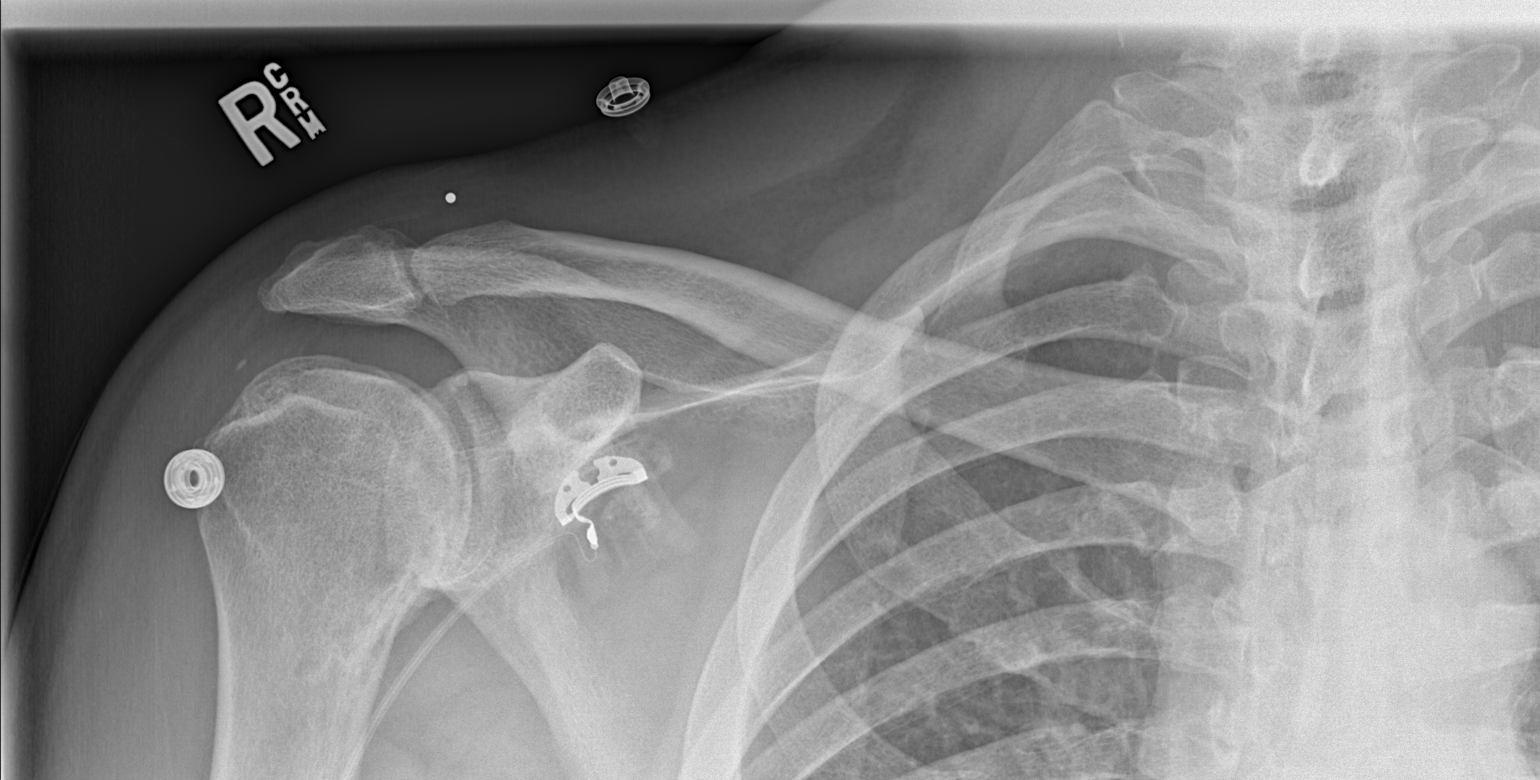

[3 of 3 positions shown; findings below may reference images not displayed]

FINDINGS: Moderate degenerative arthropathy of the right glenohumeral joint
and the AC joint. Both joints demonstrate bony spurring, sclerosis,
and joint space loss. Mild deformity of the humeral head noted. No
acute osseous finding, fracture or malalignment.
IMPRESSION: Right shoulder moderate degenerative change of the AC joint and
glenohumeral joint. No acute osseous finding

## 2018-10-06 DIAGNOSIS — E785 Hyperlipidemia, unspecified: Secondary | ICD-10-CM | POA: Diagnosis not present

## 2018-10-06 DIAGNOSIS — I10 Essential (primary) hypertension: Secondary | ICD-10-CM | POA: Diagnosis not present

## 2018-10-06 DIAGNOSIS — I4891 Unspecified atrial fibrillation: Secondary | ICD-10-CM | POA: Diagnosis not present

## 2018-10-06 DIAGNOSIS — E1169 Type 2 diabetes mellitus with other specified complication: Secondary | ICD-10-CM | POA: Diagnosis not present

## 2018-10-20 DIAGNOSIS — E1169 Type 2 diabetes mellitus with other specified complication: Secondary | ICD-10-CM | POA: Diagnosis not present

## 2018-10-20 DIAGNOSIS — I4891 Unspecified atrial fibrillation: Secondary | ICD-10-CM | POA: Diagnosis not present

## 2018-10-20 DIAGNOSIS — E559 Vitamin D deficiency, unspecified: Secondary | ICD-10-CM | POA: Diagnosis not present

## 2018-10-22 ENCOUNTER — Other Ambulatory Visit: Payer: Self-pay | Admitting: Interventional Cardiology

## 2018-10-22 ENCOUNTER — Other Ambulatory Visit: Payer: Self-pay | Admitting: Cardiology

## 2018-10-23 NOTE — Telephone Encounter (Signed)
Please advise if ok to refill protonix 40 mg tablet bid.

## 2018-10-30 NOTE — Progress Notes (Signed)
Cardiology Office Note:    Date:  10/31/2018   ID:  Carlos Gutierrez, Carlos Gutierrez 13-Aug-1950, MRN [id number]  PCP:  London Pepper, MD  Cardiologist:  Sinclair Grooms, MD   Electrophysiologist:  Cristopher Peru, MD   Referring MD: London Pepper, MD   Chief Complaint  Patient presents with   Follow-up    CAD, AFib    History of Present Illness:    Carlos Gutierrez is a 68 y.o. male with:  Atrial Flutter s/p RFCA  Paroxymal atrial fibrillation   Flecainide "Pill In Pocket"  CHADS2-VASc=4 (Diab, CAD, HTN, age x 1)  Xarelto Rx    Coronary artery disease   Mod non-obs disease by cath 01/2017  Hyperlipidemia   Hypertension   OSA  DVT dx 09/2016 >> Xarelto Rx  Diabetes mellitus 2   Carlos Gutierrez was last seen by Dr. Tamala Julian in 09/2017.  He returns for follow up.  He is here alone.  He is overall doing well.  Unfortunately, 2 of his coworkers died from COVID-39.  He has been working from home since March and has not been traveling.  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  He has occasional left shoulder discomfort.  However, he is able to exercise without substernal chest discomfort.  He has not had significant shortness of breath.  He has not had orthopnea, syncope.  He does have occasional left ankle swelling that improves with elevation.  Prior CV studies:   The following studies were reviewed today:  Cardiac catheterization 02/11/17 LAD ost 50 RI ost 65 OM1 ost 60 RCA prox 40, dist 70 Normal LVEF   Myoview 01/31/17 There is a small apical fixed perfusion abnormality: consider small distal LAD artery-related scar versus "apical thinning" artifact. Low risk stress nuclear study with otherwise normal perfusion and normal left ventricular regional and global systolic function. EF 55  Echocardiogram 09/21/16 Mild LVH, EF 55-60, no RWMA, Gr 2 DD, mild LAE  LE Venous US 09/21/16 - Findings consistent with indeterminate  age deep vein thrombosis   involving the left peroneal vein. - All other veins are patent without evidence of deep or   superficial vein thrombosis.  Echo 9/12 Mild LAE, borderline conc LVH, EF 60-65, mild aortic root dilatation, normal diastolic function   Past Medical History:  Diagnosis Date   ACL tear    RIGHT KNEE   Atrial fibrillation ([place]) 2012   Rare occurrences. On ASA & Flecainide prn   Atrial flutter ([place])    s/p ablation 2008. On Flecainide prn.   Broken leg 1968   Concussion 1966   History of 4   DVT (deep venous thrombosis) (HCC)    LEFT LOWER LEG   Dyslipidemia    Essential hypertension, benign    Insomnia    Low back pain    Obesity (BMI 30-39.9)    Obstructive sleep apnea on CPAP    Mild   PAF (paroxysmal atrial fibrillation) ([place])    Rare occurrences. On ASA & Flecainide prn   Pure hypercholesterolemia    On Lipitor    Right shoulder pain    Intermittent   Status post ablation of atrial flutter 6314   Umbilical hernia    Surgical Hx: The patient  has a past surgical history that includes right knee surgery (1996); coronary angiography (1997); ablation for atrial flutter (01/2002); Cardiac catheterization (12/26/01); Umbilical hernia repair (04/2015); Colonoscopy with propofol (N/A, 03/12/2016);  Esophagogastroduodenoscopy (egd) with propofol (N/A, 10/15/2016); and LEFT HEART CATH AND CORONARY ANGIOGRAPHY (N/A, 02/11/2017).   Current Medications: Current Meds  Medication Sig   acetaminophen (TYLENOL) 325 MG tablet Take 325-650 mg by mouth every 6 (six) hours as needed (for pain.).   aspirin EC 81 MG tablet Take 1 tablet (81 mg total) by mouth daily.   atorvastatin (LIPITOR) 40 MG tablet TAKE 1 TABLET ONCE DAILY.   cetirizine (ZYRTEC) 10 MG tablet Take 10 mg by mouth at bedtime as needed for allergies.   flecainide (TAMBOCOR) 100 MG tablet Take 2 tablets (200 mg total) by mouth daily as needed (for afib).   glipiZIDE (GLUCOTROL XL)  2.5 MG 24 hr tablet Take 2.5 mg by mouth daily with breakfast.   hydrochlorothiazide (MICROZIDE) 12.5 MG capsule Take 1 capsule (12.5 mg total) by mouth daily.   Melatonin 3 MG TABS Take 1 tablet by mouth at bedtime as needed (sleep).   Multiple Vitamins-Minerals (MULTIVITAMIN WITH MINERALS) tablet Take 1 tablet by mouth daily.     nitroGLYCERIN (NITROSTAT) 0.4 MG SL tablet Place 1 tablet (0.4 mg total) under the tongue every 5 (five) minutes as needed.   olmesartan (BENICAR) 20 MG tablet Take 1 tablet (20 mg total) by mouth daily.   pantoprazole (PROTONIX) 40 MG tablet TAKE 1 TABLET BY MOUTH TWICE DAILY.   XARELTO 20 MG TABS tablet TAKE 1 TABLET ONCE DAILY WITH DINNER.   [DISCONTINUED] flecainide (TAMBOCOR) 100 MG tablet Take 2 tablets (200 mg total) by mouth daily as needed (for afib).   [DISCONTINUED] olmesartan (BENICAR) 20 MG tablet Take 1 tablet (20 mg total) by mouth daily.     Allergies:   Metformin and related   Social History   Tobacco Use   Smoking status: Never Smoker   Smokeless tobacco: Never Used  Substance Use Topics   Alcohol use: Yes    Alcohol/week: 7.0 - 10.0 standard drinks    Types: 7 - 10 Glasses of wine per week   Drug use: No     Family Hx: The patient's family history includes COPD in his mother; Colon cancer (age of onset: 60) in his maternal grandfather; Diabetes in his mother; Diabetes (age of onset: 8) in his brother; Heart attack in his father and paternal grandfather; Heart disease in his mother; Heart disease (age of onset: 74) in his father; Heart failure in his father; Hypertension in his father; Hypertension (age of onset: 74) in his brother; Hypertension (age of onset: 48) in his mother. There is no history of Stroke.  ROS:   Please see the history of present illness.    ROS All other systems reviewed and are negative.   EKGs/Labs/Other Test Reviewed:    EKG:  EKG is  ordered today.  The ekg ordered today demonstrates sinus  bradycardia, HR 53, normal axis, QTC 416, no change from prior tracing  Recent Labs: No results found for requested labs within last 8760 hours.   Recent Lipid Panel Lab Results  Component Value Date/Time   CHOL 133 10/05/2017 09:06 AM   TRIG 90 10/05/2017 09:06 AM   HDL 45 10/05/2017 09:06 AM   CHOLHDL 3.0 10/05/2017 09:06 AM   LDLCALC 70 10/05/2017 09:06 AM     Physical Exam:    VS:  BP 132/86    Pulse (!) 53    Ht 6\' 1"  (1.854 m)    Wt 228 lb (103.4 kg)    SpO2 98%    BMI 30.08 kg/m  Wt Readings from Last 3 Encounters:  10/31/18 228 lb (103.4 kg)  10/05/17 231 lb 6.4 oz (105 kg)  03/04/17 241 lb 9.6 oz (109.6 kg)     Physical Exam  Constitutional: He is oriented to person, place, and time. He appears well-developed and well-nourished. No distress.  HENT:  Head: Normocephalic and atraumatic.  Eyes: No scleral icterus.  Neck: No JVD present. Carotid bruit is not present. No thyromegaly present.  Cardiovascular: Normal rate, regular rhythm and normal heart sounds.  No murmur heard. Pulmonary/Chest: Effort normal and breath sounds normal. He has no rales.  Abdominal: Soft. There is no hepatomegaly.  Musculoskeletal:        General: Edema (trace L ankle edema) present.  Lymphadenopathy:    He has no cervical adenopathy.  Neurological: He is alert and oriented to person, place, and time.  Skin: Skin is warm and dry.  Psychiatric: He has a normal mood and affect.    ASSESSMENT & PLAN:    1. Coronary artery disease involving native coronary artery of native heart without angina pectoris Mod non-obs CAD by cardiac catheterization in 2018.  He is not having anginal symptoms.  However, he has had some L shoulder discomfort from time to time.  This does not necessarily occur with exertion.  He took NTG a few weeks ago for the pain and it gradually resolved over 1 hour.  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  I suspect his discomfort is more from DJD than ischemia.   His ECG is unchanged.  I have asked him to notify us if he has more frequent symptoms or exertional pain.  We will need to see him back sooner at that point and consider stress testing.  As long as he remains stable, I would consider a routine ETT in the next 1-2 years.  Continue ASA, statin.  2. Paroxysmal atrial fibrillation ([place]) He had an episode of atrial fibrillation last night that resolved with Flecainide.  Continue current Rx.  CrCl 85.  Continue current dose of Xarelto.    3. Essential hypertension The patient's blood pressure is controlled on his current regimen.  Continue current therapy.    4. Pure hypercholesterolemia LDL optimal on most recent lab work.  Continue current Rx.    5. Type 2 diabetes mellitus without complication, without long-term current use of insulin (HCC) Recent A1c < 6.      Dispo:  Return in about 6 months (around 05/03/2019) for Routine Follow Up w/ Dr. Tamala Julian, or Richardson Dopp, PA-C.   Medication Adjustments/Labs and Tests Ordered: Current medicines are reviewed at length with the patient today.  Concerns regarding medicines are outlined above.  Tests Ordered: Orders Placed This Encounter  Procedures   EKG 12-Lead   Medication Changes: Meds ordered this encounter  Medications   flecainide (TAMBOCOR) 100 MG tablet    Sig: Take 2 tablets (200 mg total) by mouth daily as needed (for afib).    Dispense:  180 tablet    Refill:  3   olmesartan (BENICAR) 20 MG tablet    Sig: Take 1 tablet (20 mg total) by mouth daily.    Dispense:  90 tablet    Refill:  3    Signed, Richardson Dopp, PA-C  10/31/2018 4:46 PM    [facility] [address], [place], [place]  [address] Phone: [phone]; Fax: [phone]

## 2018-10-31 ENCOUNTER — Ambulatory Visit: Payer: BLUE CROSS/BLUE SHIELD | Admitting: Physician Assistant

## 2018-10-31 ENCOUNTER — Encounter: Payer: Self-pay | Admitting: Physician Assistant

## 2018-10-31 ENCOUNTER — Other Ambulatory Visit: Payer: Self-pay

## 2018-10-31 VITALS — BP 132/86 | HR 53 | Ht 73.0 in | Wt 228.0 lb

## 2018-10-31 DIAGNOSIS — I2510 Atherosclerotic heart disease of native coronary artery without angina pectoris: Secondary | ICD-10-CM

## 2018-10-31 DIAGNOSIS — I10 Essential (primary) hypertension: Secondary | ICD-10-CM | POA: Diagnosis not present

## 2018-10-31 DIAGNOSIS — I480 Paroxysmal atrial fibrillation: Secondary | ICD-10-CM

## 2018-10-31 DIAGNOSIS — E7800 Pure hypercholesterolemia, unspecified: Secondary | ICD-10-CM

## 2018-10-31 DIAGNOSIS — E119 Type 2 diabetes mellitus without complications: Secondary | ICD-10-CM

## 2018-10-31 MED ORDER — OLMESARTAN MEDOXOMIL 20 MG PO TABS: 20.0000 mg | ORAL_TABLET | Freq: Every day | ORAL | 3 refills | Status: DC

## 2018-10-31 MED ORDER — FLECAINIDE ACETATE 100 MG PO TABS: 200.0000 mg | ORAL_TABLET | Freq: Every day | ORAL | 3 refills | Status: DC | PRN

## 2018-10-31 NOTE — Patient Instructions (Addendum)
Medication Instructions:  Your physician recommends that you continue on your current medications as directed. Please refer to the Current Medication list given to you today.  If you need a refill on your cardiac medications before your next appointment, please call your pharmacy.   Lab work: None  If you have labs (blood work) drawn today and your tests are completely normal, you will receive your results only by: [name] MyChart Message (if you have MyChart) OR . A paper copy in the mail If you have any lab test that is abnormal or we need to change your treatment, we will call you to review the results.  Testing/Procedures: None    Follow-Up: You are scheduled to see Richardson Dopp PA-C on 04/16/2019 @ 8:45 AM  Any Other Special Instructions Will Be Listed Below (If Applicable).

## 2018-11-13 ENCOUNTER — Other Ambulatory Visit: Payer: Self-pay | Admitting: Interventional Cardiology

## 2018-11-13 NOTE — Telephone Encounter (Signed)
50m 103.4kg Scr 1.33 10/05/17 ccr 80 ml/min Lovw/weaver 10/31/18

## 2018-11-22 ENCOUNTER — Other Ambulatory Visit: Payer: Self-pay | Admitting: Physician Assistant

## 2018-11-22 MED ORDER — RIVAROXABAN 20 MG PO TABS: ORAL_TABLET | ORAL | 5 refills | Status: DC

## 2018-11-22 NOTE — Telephone Encounter (Signed)
Xarelto 20mg  refill request received; pt is 68 years old, weight-103.4kg, Crea-1.27 on 10/20/2018, last seen by Richardson Dopp on 10/31/2018, Diagnosis-Afib & Flutter, CrCl-83.55ml/min; Dose is appropriate based on dosing criteria. Will send in refill to requested pharmacy.

## 2018-11-22 NOTE — Telephone Encounter (Signed)
° ° ° °  Patient states he Merdis Delay tod he needed an appointment in order to get next refill. Patient was seen 8/18    1. Which medications need to be refilled? (please list name of each medication and dose if known) XARELTO 20 MG TABS tablet  2. Which pharmacy/location (including street and city if local pharmacy) is medication to be sent to?[place]  3. Do they need a 30 day or 90 day supply? [place]

## 2018-11-30 ENCOUNTER — Other Ambulatory Visit: Payer: Self-pay | Admitting: Interventional Cardiology

## 2018-12-24 ENCOUNTER — Other Ambulatory Visit: Payer: Self-pay | Admitting: Interventional Cardiology

## 2019-01-22 ENCOUNTER — Other Ambulatory Visit: Payer: Self-pay | Admitting: Interventional Cardiology

## 2019-01-29 ENCOUNTER — Encounter: Payer: Self-pay | Admitting: *Deleted

## 2019-02-20 DIAGNOSIS — D485 Neoplasm of uncertain behavior of skin: Secondary | ICD-10-CM | POA: Diagnosis not present

## 2019-02-20 DIAGNOSIS — L738 Other specified follicular disorders: Secondary | ICD-10-CM | POA: Diagnosis not present

## 2019-02-20 DIAGNOSIS — D1801 Hemangioma of skin and subcutaneous tissue: Secondary | ICD-10-CM | POA: Diagnosis not present

## 2019-02-20 DIAGNOSIS — L821 Other seborrheic keratosis: Secondary | ICD-10-CM | POA: Diagnosis not present

## 2019-02-20 DIAGNOSIS — D225 Melanocytic nevi of trunk: Secondary | ICD-10-CM | POA: Diagnosis not present

## 2019-02-21 DIAGNOSIS — C4442 Squamous cell carcinoma of skin of scalp and neck: Secondary | ICD-10-CM | POA: Diagnosis not present

## 2019-02-21 DIAGNOSIS — C44311 Basal cell carcinoma of skin of nose: Secondary | ICD-10-CM | POA: Diagnosis not present

## 2019-02-27 DIAGNOSIS — M19012 Primary osteoarthritis, left shoulder: Secondary | ICD-10-CM | POA: Diagnosis not present

## 2019-02-27 DIAGNOSIS — M19011 Primary osteoarthritis, right shoulder: Secondary | ICD-10-CM | POA: Diagnosis not present

## 2019-03-13 DIAGNOSIS — D044 Carcinoma in situ of skin of scalp and neck: Secondary | ICD-10-CM | POA: Diagnosis not present

## 2019-04-16 ENCOUNTER — Ambulatory Visit: Payer: BC Managed Care – PPO | Admitting: Physician Assistant

## 2019-04-16 NOTE — Progress Notes (Signed)
Cardiology Office Note:    Date:  04/17/2019   ID:  Carlos Gutierrez, Carlos Gutierrez 1950-03-25, MRN [id number]  PCP:  London Pepper, MD  Cardiologist:  Sinclair Grooms, MD   Electrophysiologist:  Cristopher Peru, MD   Referring MD: London Pepper, MD   Chief Complaint:  Chest Pain and Hemoptysis    Patient Profile:    Carlos Gutierrez is a 69 y.o. male with:   Atrial Flutter s/p RFCA  Paroxymal atrial fibrillation  ? Flecainide "Pill In Pocket" ? CHADS2-VASc=4 (Diab, CAD, HTN, age x 1) ? Xarelto Rx    Coronary artery disease  ? Mod non-obs disease by cath 01/2017  Hyperlipidemia   Hypertension   OSA  DVT dx 09/2016 >> Xarelto Rx  Diabetes mellitus 2  Prior CV studies: Cardiac catheterization Feb 20, 2017 LAD ost 50 RI ost 65 OM1 ost 60 RCA prox 40, dist 70 Normal LVEF   Myoview 01/31/17 There is a small apical fixed perfusion abnormality: consider small distal LAD artery-related scar versus "apical thinning" artifact. Low risk stress nuclear study with otherwise normal perfusion and normal left ventricular regional and global systolic function. EF 55  Echocardiogram 09/21/16 Mild LVH, EF 55-60, no RWMA, Gr 2 DD, mild LAE  Echo 9/12 Mild LAE, borderline conc LVH, EF 60-65, mild aortic root dilatation, normal diastolic function  History of Present Illness:    Carlos Gutierrez was last seen in August 2020.  He comes in today with complaints of left-sided chest discomfort described as an ache.  This has been going on for the last week or 2.  He has some radiation to his left shoulder and arm.  He really has not noticed any changes with exertion.  But he has been short of breath with just minimal activity.  This is unusual for him.  He has been nauseated.  He has not had syncope, orthopnea or leg swelling. He has had some night sweats.  He has not really had any significant weight changes.  He did have 2 episodes of AFib this weekend which resolved with flecainide.  He did spit up  some blood this morning while brushing his teeth but has not been coughing up blood.      Past Medical History:  Diagnosis Date   ACL tear    RIGHT KNEE   Atrial fibrillation ([place]) 2012   Rare occurrences. On ASA & Flecainide prn   Atrial flutter ([place])    s/p ablation 2008. On Flecainide prn.   Broken leg 1968   Concussion 1966   History of 4   DVT (deep venous thrombosis) (HCC)    LEFT LOWER LEG   Dyslipidemia    Essential hypertension, benign    Insomnia    Low back pain    Obesity (BMI 30-39.9)    Obstructive sleep apnea on CPAP    Mild   PAF (paroxysmal atrial fibrillation) ([place])    Rare occurrences. On ASA & Flecainide prn   Pure hypercholesterolemia    On Lipitor    Right shoulder pain    Intermittent   Status post ablation of atrial flutter [id number]   Umbilical hernia    Surgical Hx: He  has a past surgical history that includes right knee surgery (1996); coronary angiography (1997); ablation for atrial flutter (01/2002); Cardiac catheterization (12/26/01); Umbilical hernia repair (04/2015); Colonoscopy with propofol (N/A, 03/12/2016); Esophagogastroduodenoscopy (egd) with propofol (N/A, 10/15/2016); and LEFT HEART CATH AND CORONARY ANGIOGRAPHY (N/A, 02/20/17).   Current Medications: Current Meds  Medication Sig   acetaminophen (TYLENOL) 325 MG tablet Take 325-650 mg by mouth every 6 (six) hours as needed (for pain.).   aspirin EC 81 MG tablet Take 1 tablet (81 mg total) by mouth daily.   atorvastatin (LIPITOR) 40 MG tablet TAKE 1 TABLET ONCE DAILY.   cetirizine (ZYRTEC) 10 MG tablet Take 10 mg by mouth at bedtime as needed for allergies.   flecainide (TAMBOCOR) 100 MG tablet Take 2 tablets (200 mg total) by mouth daily as needed (for afib).   glipiZIDE (GLUCOTROL XL) 2.5 MG 24 hr tablet Take 2.5 mg by mouth daily with breakfast.   hydrochlorothiazide (MICROZIDE) 12.5 MG capsule Take 1 capsule (12.5 mg total) by mouth daily.   Melatonin 3 MG  TABS Take 1 tablet by mouth at bedtime as needed (sleep).   Multiple Vitamins-Minerals (MULTIVITAMIN WITH MINERALS) tablet Take 1 tablet by mouth daily.     nitroGLYCERIN (NITROSTAT) 0.4 MG SL tablet Place 1 tablet (0.4 mg total) under the tongue every 5 (five) minutes as needed.   olmesartan (BENICAR) 20 MG tablet Take 1 tablet (20 mg total) by mouth daily.   pantoprazole (PROTONIX) 40 MG tablet TAKE 1 TABLET BY MOUTH TWICE DAILY.   rivaroxaban (XARELTO) 20 MG TABS tablet TAKE 1 TABLET ONCE DAILY WITH DINNER.     Allergies:   Metformin and related   Social History   Tobacco Use   Smoking status: Never Smoker   Smokeless tobacco: Never Used  Substance Use Topics   Alcohol use: Yes    Alcohol/week: 7.0 - 10.0 standard drinks    Types: 7 - 10 Glasses of wine per week   Drug use: No     Family Hx: The patient's family history includes COPD in his mother; Colon cancer (age of onset: 9) in his maternal grandfather; Diabetes in his mother; Diabetes (age of onset: 8) in his brother; Heart attack in his father and paternal grandfather; Heart disease in his mother; Heart disease (age of onset: 59) in his father; Heart failure in his father; Hypertension in his father; Hypertension (age of onset: 93) in his brother; Hypertension (age of onset: 53) in his mother. There is no history of Stroke.  Review of Systems  Constitution: Positive for malaise/fatigue.  Musculoskeletal: Positive for back pain.  Gastrointestinal: Negative for hematochezia and melena.     EKGs/Labs/Other Test Reviewed:    EKG:  EKG is   ordered today.  The ekg ordered today was personally reviewed and demonstrates normal sinus rhythm, 1st degree AVB, normal axis, QTc 421, no ST-TW changes  Recent Labs: No results found for requested labs within last 8760 hours.   Recent Lipid Panel Lab Results  Component Value Date/Time   CHOL 133 10/05/2017 09:06 AM   TRIG 90 10/05/2017 09:06 AM   HDL 45 10/05/2017 09:06  AM   CHOLHDL 3.0 10/05/2017 09:06 AM   LDLCALC 70 10/05/2017 09:06 AM     Physical Exam:    VS:  BP 110/70    Pulse 78    Ht 6\' 1"  (1.854 m)    Wt 232 lb (105.2 kg)    BMI 30.61 kg/m     Wt Readings from Last 3 Encounters:  04/17/19 232 lb (105.2 kg)  10/31/18 228 lb (103.4 kg)  10/05/17 231 lb 6.4 oz (105 kg)     Constitutional:      Appearance: Healthy appearance. Not in distress.  Eyes:     Pupils: Pupils are equal, round, and reactive  to light.  HENT:     Nose: Nose normal.  Neck:     Thyroid: Thyroid normal.     Vascular: JVD normal.     Lymphadenopathy: No cervical adenopathy.  Pulmonary:     Effort: Pulmonary effort is normal.     Breath sounds: No wheezing. No rales.  Chest:     Chest wall: Not tender to palpatation.  Cardiovascular:     Normal rate. Regular rhythm. Normal S1. Normal S2.     Murmurs: There is no murmur.     No rub.  Edema:    Peripheral edema absent.  Abdominal:     Palpations: Abdomen is soft. There is no hepatomegaly.  Musculoskeletal: Normal range of motion.     Cervical back: Neck supple. Skin:    General: Skin is warm and dry.  Neurological:     General: No focal deficit present.     Mental Status: Alert and oriented to person, place and time.     Cranial Nerves: Cranial nerves are intact.  Psychiatric:        Mood and Affect: Affect normal.     ASSESSMENT & PLAN:    1. Coronary artery disease involving native coronary artery of native heart with unstable angina pectoris ([place]) 2. Shortness of breath He has a history of moderate nonobstructive coronary artery disease by cardiac catheterization in 2018.  At that time, he had fairly diffuse disease.  He is a diabetic.  He now presents with chest discomfort and shortness of breath with just minimal activity.  His chest discomfort has some typical and atypical features.  Some of it seems to be related to positional changes.  He has been under a lot of stress as his wife just had a brain  tumor removed.  However, his lethargy and shortness of breath are unusual for him and quite concerning.  His ECG does not demonstrate any significant ischemic changes.  However, given his risk factors, I recommended admission to the hospital for cardiac catheterization.  I reviewed this with Dr. Tamala Julian who also saw the patient and agreed.  He has been short of breath.  He did report coughing up blood this morning.  However, this sounded more consistent with gum bleeding from brushing.  He has not been febrile and really has not had a significant cough.  He is on Rivaroxaban.  He will need a least 24 hours of washout before proceeding with cardiac catheterization.  Risks and benefits of cardiac catheterization have been discussed with the patient.  These include bleeding, infection, kidney damage, stroke, heart attack, death.  The patient understands these risks and is willing to proceed.   -Admit to [facility], cardiac telemetry  -COVID-19 test  -Check serial high-sensitivity troponin  -Obtain CMET, CBC with differential, sed rate  -Obtain CXR  -Obtain echocardiogram  -Proceed with cardiac catheterization tomorrow  3. Paroxysmal atrial fibrillation (HCC) He is currently maintaining sinus rhythm.  He did have 2 episodes of atrial fibrillation this past weekend.  He took flecainide to convert to sinus rhythm.  Therefore, we will place him on IV heparin while his Rivaroxaban is washing out.  4. Essential hypertension The patient's blood pressure is controlled on his current regimen.  Continue current therapy.   5. Pure hypercholesterolemia LDL optimal on most recent lab work.  Continue current Rx.    6. Diabetes mellitus Continue glipizide.  Obtain A1c.    Dispo:  Return for [facility] Follow Up.   Medication Adjustments/Labs  and Tests Ordered: Current medicines are reviewed at length with the patient today.  Concerns regarding medicines are outlined above.  Tests Ordered: No orders of the  defined types were placed in this encounter.  Medication Changes: No orders of the defined types were placed in this encounter.   Signed, Richardson Dopp, PA-C  04/17/2019 9:50 AM    [facility] [place], [place], [place]  [address] Phone: [phone]; Fax: [phone]

## 2019-04-17 ENCOUNTER — Observation Stay (HOSPITAL_COMMUNITY): Payer: BC Managed Care – PPO

## 2019-04-17 ENCOUNTER — Other Ambulatory Visit: Payer: Self-pay

## 2019-04-17 ENCOUNTER — Inpatient Hospital Stay (HOSPITAL_COMMUNITY)
Admission: AD | Admit: 2019-04-17 | Discharge: 2019-04-22 | DRG: 287 | Disposition: A | Discharge status: Home / Self Care | Payer: BC Managed Care – PPO | Source: Ambulatory Visit | Attending: Interventional Cardiology | Admitting: Interventional Cardiology

## 2019-04-17 ENCOUNTER — Encounter: Payer: Self-pay | Admitting: Physician Assistant

## 2019-04-17 ENCOUNTER — Ambulatory Visit: Payer: BC Managed Care – PPO | Admitting: Physician Assistant

## 2019-04-17 VITALS — BP 110/70 | HR 78 | Ht 73.0 in | Wt 232.0 lb

## 2019-04-17 DIAGNOSIS — K766 Portal hypertension: Secondary | ICD-10-CM | POA: Diagnosis not present

## 2019-04-17 DIAGNOSIS — R1032 Left lower quadrant pain: Secondary | ICD-10-CM

## 2019-04-17 DIAGNOSIS — E669 Obesity, unspecified: Secondary | ICD-10-CM | POA: Diagnosis present

## 2019-04-17 DIAGNOSIS — K7460 Unspecified cirrhosis of liver: Secondary | ICD-10-CM | POA: Diagnosis present

## 2019-04-17 DIAGNOSIS — R079 Chest pain, unspecified: Secondary | ICD-10-CM

## 2019-04-17 DIAGNOSIS — Z7984 Long term (current) use of oral hypoglycemic drugs: Secondary | ICD-10-CM | POA: Diagnosis not present

## 2019-04-17 DIAGNOSIS — K8020 Calculus of gallbladder without cholecystitis without obstruction: Secondary | ICD-10-CM | POA: Diagnosis not present

## 2019-04-17 DIAGNOSIS — I10 Essential (primary) hypertension: Secondary | ICD-10-CM | POA: Diagnosis present

## 2019-04-17 DIAGNOSIS — E7800 Pure hypercholesterolemia, unspecified: Secondary | ICD-10-CM

## 2019-04-17 DIAGNOSIS — Z8249 Family history of ischemic heart disease and other diseases of the circulatory system: Secondary | ICD-10-CM | POA: Diagnosis not present

## 2019-04-17 DIAGNOSIS — Z6830 Body mass index (BMI) 30.0-30.9, adult: Secondary | ICD-10-CM

## 2019-04-17 DIAGNOSIS — Z7982 Long term (current) use of aspirin: Secondary | ICD-10-CM | POA: Diagnosis not present

## 2019-04-17 DIAGNOSIS — E785 Hyperlipidemia, unspecified: Secondary | ICD-10-CM | POA: Diagnosis present

## 2019-04-17 DIAGNOSIS — I4891 Unspecified atrial fibrillation: Secondary | ICD-10-CM | POA: Diagnosis present

## 2019-04-17 DIAGNOSIS — G4733 Obstructive sleep apnea (adult) (pediatric): Secondary | ICD-10-CM | POA: Diagnosis present

## 2019-04-17 DIAGNOSIS — R042 Hemoptysis: Secondary | ICD-10-CM | POA: Diagnosis not present

## 2019-04-17 DIAGNOSIS — Z86718 Personal history of other venous thrombosis and embolism: Secondary | ICD-10-CM | POA: Diagnosis not present

## 2019-04-17 DIAGNOSIS — I480 Paroxysmal atrial fibrillation: Secondary | ICD-10-CM | POA: Diagnosis not present

## 2019-04-17 DIAGNOSIS — I4892 Unspecified atrial flutter: Secondary | ICD-10-CM | POA: Diagnosis not present

## 2019-04-17 DIAGNOSIS — Z20822 Contact with and (suspected) exposure to covid-19: Secondary | ICD-10-CM | POA: Diagnosis present

## 2019-04-17 DIAGNOSIS — K5792 Diverticulitis of intestine, part unspecified, without perforation or abscess without bleeding: Secondary | ICD-10-CM | POA: Diagnosis not present

## 2019-04-17 DIAGNOSIS — Z800 Family history of malignant neoplasm of digestive organs: Secondary | ICD-10-CM | POA: Diagnosis not present

## 2019-04-17 DIAGNOSIS — K5732 Diverticulitis of large intestine without perforation or abscess without bleeding: Secondary | ICD-10-CM | POA: Diagnosis present

## 2019-04-17 DIAGNOSIS — Z79899 Other long term (current) drug therapy: Secondary | ICD-10-CM

## 2019-04-17 DIAGNOSIS — E1165 Type 2 diabetes mellitus with hyperglycemia: Secondary | ICD-10-CM | POA: Diagnosis not present

## 2019-04-17 DIAGNOSIS — R0602 Shortness of breath: Secondary | ICD-10-CM

## 2019-04-17 DIAGNOSIS — E119 Type 2 diabetes mellitus without complications: Secondary | ICD-10-CM

## 2019-04-17 DIAGNOSIS — Z888 Allergy status to other drugs, medicaments and biological substances status: Secondary | ICD-10-CM | POA: Diagnosis not present

## 2019-04-17 DIAGNOSIS — I25110 Atherosclerotic heart disease of native coronary artery with unstable angina pectoris: Principal | ICD-10-CM | POA: Diagnosis present

## 2019-04-17 DIAGNOSIS — I2510 Atherosclerotic heart disease of native coronary artery without angina pectoris: Secondary | ICD-10-CM

## 2019-04-17 DIAGNOSIS — Z825 Family history of asthma and other chronic lower respiratory diseases: Secondary | ICD-10-CM | POA: Diagnosis not present

## 2019-04-17 DIAGNOSIS — Z833 Family history of diabetes mellitus: Secondary | ICD-10-CM | POA: Diagnosis not present

## 2019-04-17 DIAGNOSIS — Z7901 Long term (current) use of anticoagulants: Secondary | ICD-10-CM | POA: Diagnosis not present

## 2019-04-17 DIAGNOSIS — I2584 Coronary atherosclerosis due to calcified coronary lesion: Secondary | ICD-10-CM | POA: Diagnosis not present

## 2019-04-17 DIAGNOSIS — K5730 Diverticulosis of large intestine without perforation or abscess without bleeding: Secondary | ICD-10-CM | POA: Diagnosis not present

## 2019-04-17 DIAGNOSIS — E871 Hypo-osmolality and hyponatremia: Secondary | ICD-10-CM | POA: Diagnosis not present

## 2019-04-17 DIAGNOSIS — N1831 Chronic kidney disease, stage 3a: Secondary | ICD-10-CM | POA: Diagnosis not present

## 2019-04-17 DIAGNOSIS — R161 Splenomegaly, not elsewhere classified: Secondary | ICD-10-CM | POA: Diagnosis not present

## 2019-04-17 DIAGNOSIS — R509 Fever, unspecified: Secondary | ICD-10-CM | POA: Diagnosis not present

## 2019-04-17 DIAGNOSIS — K7030 Alcoholic cirrhosis of liver without ascites: Secondary | ICD-10-CM | POA: Diagnosis not present

## 2019-04-17 LAB — CBC WITH DIFFERENTIAL/PLATELET
Abs Immature Granulocytes: 0 10*3/uL (ref 0.00–0.07)
Basophils Absolute: 0 10*3/uL (ref 0.0–0.1)
Basophils Relative: 1 %
Eosinophils Absolute: 0.1 10*3/uL (ref 0.0–0.5)
Eosinophils Relative: 2 %
HCT: 45.7 % (ref 39.0–52.0)
Hemoglobin: 15.4 g/dL (ref 13.0–17.0)
Lymphocytes Relative: 27 %
Lymphs Abs: 1 10*3/uL (ref 0.7–4.0)
MCH: 30.6 pg (ref 26.0–34.0)
MCHC: 33.7 g/dL (ref 30.0–36.0)
MCV: 90.7 fL (ref 80.0–100.0)
Monocytes Absolute: 0.3 10*3/uL (ref 0.1–1.0)
Monocytes Relative: 8 %
Neutro Abs: 2.3 10*3/uL (ref 1.7–7.7)
Neutrophils Relative %: 62 %
Platelets: 124 10*3/uL — ABNORMAL LOW (ref 150–400)
RBC: 5.04 MIL/uL (ref 4.22–5.81)
RDW: 14.6 % (ref 11.5–15.5)
WBC: 3.7 10*3/uL — ABNORMAL LOW (ref 4.0–10.5)
nRBC: 0 % (ref 0.0–0.2)
nRBC: 0 /100 WBC

## 2019-04-17 LAB — COMPREHENSIVE METABOLIC PANEL
ALT: 60 U/L — ABNORMAL HIGH (ref 0–44)
AST: 44 U/L — ABNORMAL HIGH (ref 15–41)
Albumin: 4 g/dL (ref 3.5–5.0)
Alkaline Phosphatase: 82 U/L (ref 38–126)
Anion gap: 10 (ref 5–15)
BUN: 19 mg/dL (ref 8–23)
CO2: 26 mmol/L (ref 22–32)
Calcium: 9.1 mg/dL (ref 8.9–10.3)
Chloride: 103 mmol/L (ref 98–111)
Creatinine, Ser: 1.36 mg/dL — ABNORMAL HIGH (ref 0.61–1.24)
GFR calc Af Amer: >60 mL/min (ref >60)
GFR calc non Af Amer: 53 mL/min — ABNORMAL LOW (ref >60)
Glucose, Bld: 103 mg/dL — ABNORMAL HIGH (ref 70–99)
Potassium: 3.8 mmol/L (ref 3.5–5.1)
Sodium: 139 mmol/L (ref 135–145)
Total Bilirubin: 1.7 mg/dL — ABNORMAL HIGH (ref 0.3–1.2)
Total Protein: 6.9 g/dL (ref 6.5–8.1)

## 2019-04-17 LAB — SEDIMENTATION RATE: Sed Rate: 5 mm/hr (ref 0–16)

## 2019-04-17 LAB — C-REACTIVE PROTEIN: CRP: 1.1 mg/dL — ABNORMAL HIGH (ref <1.0)

## 2019-04-17 LAB — APTT
aPTT: 39 seconds — ABNORMAL HIGH (ref 24–36)
aPTT: 102 seconds — ABNORMAL HIGH (ref 24–36)

## 2019-04-17 LAB — PROTIME-INR
INR: 1.4 — ABNORMAL HIGH (ref 0.8–1.2)
Prothrombin Time: 17.2 seconds — ABNORMAL HIGH (ref 11.4–15.2)

## 2019-04-17 LAB — HIV ANTIBODY (ROUTINE TESTING W REFLEX): HIV Screen 4th Generation wRfx: NONREACTIVE

## 2019-04-17 LAB — GLUCOSE, CAPILLARY
Glucose-Capillary: 137 mg/dL — ABNORMAL HIGH (ref 70–99)
Glucose-Capillary: 89 mg/dL (ref 70–99)

## 2019-04-17 LAB — TROPONIN I (HIGH SENSITIVITY)
Troponin I (High Sensitivity): 6 ng/L (ref <18)
Troponin I (High Sensitivity): 7 ng/L (ref <18)

## 2019-04-17 LAB — HEPARIN LEVEL (UNFRACTIONATED): Heparin Unfractionated: 1.74 IU/mL — ABNORMAL HIGH (ref 0.30–0.70)

## 2019-04-17 LAB — HEMOGLOBIN A1C
Hgb A1c MFr Bld: 6 % — ABNORMAL HIGH (ref 4.8–5.6)
Mean Plasma Glucose: 125.5 mg/dL

## 2019-04-17 LAB — SARS CORONAVIRUS 2 (TAT 6-24 HRS): SARS Coronavirus 2: NEGATIVE

## 2019-04-17 LAB — BRAIN NATRIURETIC PEPTIDE: B Natriuretic Peptide: 46.2 pg/mL (ref 0.0–100.0)

## 2019-04-17 LAB — TSH: TSH: 2.987 u[IU]/mL (ref 0.350–4.500)

## 2019-04-17 MED ORDER — INSULIN ASPART 100 UNIT/ML ~~LOC~~ SOLN: 0.0000 [IU] | Freq: Three times a day (TID) | SUBCUTANEOUS | Status: DC

## 2019-04-17 MED ORDER — IRBESARTAN 150 MG PO TABS
150.0000 mg | ORAL_TABLET | Freq: Every day | ORAL | Status: DC
  Administered 2019-04-18: 150 mg via ORAL
  Filled 2019-04-17: qty 1

## 2019-04-17 MED ORDER — SODIUM CHLORIDE 0.9 % IV SOLN: 250.0000 mL | INTRAVENOUS | Status: DC | PRN

## 2019-04-17 MED ORDER — SODIUM CHLORIDE 0.9% FLUSH: 3.0000 mL | INTRAVENOUS | Status: DC | PRN

## 2019-04-17 MED ORDER — METOPROLOL TARTRATE 12.5 MG HALF TABLET
12.5000 mg | ORAL_TABLET | Freq: Two times a day (BID) | ORAL | Status: DC
  Administered 2019-04-18: 12.5 mg via ORAL
  Filled 2019-04-17 (×2): qty 1

## 2019-04-17 MED ORDER — HEPARIN (PORCINE) 25000 UT/250ML-% IV SOLN
1400.0000 [IU]/h | INTRAVENOUS | Status: DC
  Administered 2019-04-17 – 2019-04-18 (×2): 1400 [IU]/h via INTRAVENOUS
  Filled 2019-04-17 (×2): qty 250

## 2019-04-17 MED ORDER — NITROGLYCERIN 0.4 MG SL SUBL: 0.4000 mg | SUBLINGUAL_TABLET | SUBLINGUAL | Status: DC | PRN

## 2019-04-17 MED ORDER — ADULT MULTIVITAMIN W/MINERALS CH
1.0000 | ORAL_TABLET | Freq: Every day | ORAL | Status: DC
  Administered 2019-04-18 (×2): 1 via ORAL
  Filled 2019-04-17 (×2): qty 1

## 2019-04-17 MED ORDER — SODIUM CHLORIDE 0.9 % WEIGHT BASED INFUSION
1.0000 mL/kg/h | INTRAVENOUS | Status: DC
  Administered 2019-04-18 (×2): 1 mL/kg/h via INTRAVENOUS

## 2019-04-17 MED ORDER — GLIPIZIDE ER 2.5 MG PO TB24
2.5000 mg | ORAL_TABLET | Freq: Every day | ORAL | Status: DC
  Filled 2019-04-17: qty 1

## 2019-04-17 MED ORDER — PANTOPRAZOLE SODIUM 40 MG PO TBEC
40.0000 mg | DELAYED_RELEASE_TABLET | Freq: Two times a day (BID) | ORAL | Status: DC
  Administered 2019-04-18 (×2): 40 mg via ORAL
  Filled 2019-04-17 (×3): qty 1

## 2019-04-17 MED ORDER — FLECAINIDE ACETATE 50 MG PO TABS: 200.0000 mg | ORAL_TABLET | Freq: Every day | ORAL | Status: DC | PRN

## 2019-04-17 MED ORDER — HYDROCHLOROTHIAZIDE 12.5 MG PO CAPS
12.5000 mg | ORAL_CAPSULE | Freq: Every day | ORAL | Status: DC
  Filled 2019-04-17: qty 1

## 2019-04-17 MED ORDER — SODIUM CHLORIDE 0.9% FLUSH
3.0000 mL | Freq: Two times a day (BID) | INTRAVENOUS | Status: DC
  Administered 2019-04-17 – 2019-04-18 (×2): 3 mL via INTRAVENOUS

## 2019-04-17 MED ORDER — MELATONIN 3 MG PO TABS
1.0000 | ORAL_TABLET | Freq: Every evening | ORAL | Status: DC | PRN
  Filled 2019-04-17: qty 1

## 2019-04-17 MED ORDER — LORATADINE 10 MG PO TABS
10.0000 mg | ORAL_TABLET | Freq: Every day | ORAL | Status: DC
  Administered 2019-04-18 (×2): 10 mg via ORAL
  Filled 2019-04-17 (×2): qty 1

## 2019-04-17 MED ORDER — ACETAMINOPHEN 325 MG PO TABS
650.0000 mg | ORAL_TABLET | ORAL | Status: DC | PRN
  Administered 2019-04-17: 650 mg via ORAL
  Filled 2019-04-17: qty 2

## 2019-04-17 MED ORDER — ASPIRIN EC 81 MG PO TBEC
81.0000 mg | DELAYED_RELEASE_TABLET | Freq: Every day | ORAL | Status: DC
  Administered 2019-04-18: 81 mg via ORAL
  Filled 2019-04-17: qty 1

## 2019-04-17 MED ORDER — ATORVASTATIN CALCIUM 40 MG PO TABS
40.0000 mg | ORAL_TABLET | Freq: Every day | ORAL | Status: DC
Start: 2019-04-17 — End: 2019-04-18
  Administered 2019-04-18: 40 mg via ORAL
  Filled 2019-04-17 (×2): qty 1

## 2019-04-17 MED ORDER — ASPIRIN 81 MG PO CHEW: 81.0000 mg | CHEWABLE_TABLET | ORAL | Status: DC

## 2019-04-17 NOTE — H&P (Addendum)
Admission History and Physical:    The patient has been seen in conjunction with Carlos Gutierrez, PAC . All aspects of care have been considered and discussed. The patient has been personally interviewed, examined, and all clinical data has been reviewed.  Symptoms are somewhat vague and could be related to sleep deprivation. I am concerned about the vague, continuous chest pain as he had moderate RCA disease 2-3 years ago which we have managed medically. Agree with plan for IV heparin, hold Zarelto, and plan coronary angio electively unless markers are levated. Plan and risk of cath discussed with the patient. The patient was counseled to undergo left heart catheterization, coronary angiography, and possible percutaneous coronary intervention with stent implantation. The procedural risks and benefits were discussed in detail. The risks discussed included death, stroke, myocardial infarction, life-threatening bleeding, limb ischemia, kidney injury, allergy, and possible emergency cardiac surgery. The risk of these significant complications were estimated to occur less than 1% of the time. After discussion, the patient has agreed to proceed.  Date:  04/17/2019   ID:  Carlos, Gutierrez [birth year], MRN [id number]  PCP:  London Pepper, MD  Cardiologist:  Sinclair Grooms, MD   Electrophysiologist:  Cristopher Peru, MD   Referring MD: London Pepper, MD   Chief Complaint:  Chest Pain and Hemoptysis    Patient Profile:    Carlos Gutierrez is a 69 y.o. male with:  Atrial Flutter s/p RFCA Paroxymal atrial fibrillation  Flecainide "Pill In Pocket" CHADS2-VASc=4 (Diab, CAD, HTN, age x 1) Xarelto Rx   Coronary artery disease  Mod non-obs disease by cath 01/2017 Hyperlipidemia  Hypertension  OSA DVT dx 09/2016 >> Xarelto Rx Diabetes mellitus 2  Prior CV studies: Cardiac catheterization 2017-03-01 LAD ost 50 RI ost 65 OM1 ost 60 RCA prox 40, dist 70 Normal LVEF    Myoview 01/31/17 There  is a small apical fixed perfusion abnormality: consider small distal LAD artery-related scar versus "apical thinning" artifact. Low risk stress nuclear study with otherwise normal perfusion and normal left ventricular regional and global systolic function. EF 55   Echocardiogram 09/21/16 Mild LVH, EF 55-60, no RWMA, Gr 2 DD, mild LAE   Echo 9/12 Mild LAE, borderline conc LVH, EF 60-65, mild aortic root dilatation, normal diastolic function  History of Present Illness:   [name] was last seen in August 2020.  He comes in today with complaints of left-sided chest discomfort described as an ache.  This has been going on for the last week or 2.  He has some radiation to his left shoulder and arm.  He really has not noticed any changes with exertion.  But he has been short of breath with just minimal activity.  This is unusual for him.  He has been nauseated.  He has not had syncope, orthopnea or leg swelling. He has had some night sweats.  He has not really had any significant weight changes.  He did have 2 episodes of AFib this weekend which resolved with flecainide.  He did spit up some blood this morning while brushing his teeth but has not been coughing up blood.      Past Medical History:  Diagnosis Date   ACL tear    RIGHT KNEE   Atrial fibrillation ([place]) 2012   Rare occurrences. On ASA & Flecainide prn   Atrial flutter ([place])    s/p ablation 2008. On Flecainide prn.   Broken leg 1968   Concussion 1966   History of  4   DVT (deep venous thrombosis) (HCC)    LEFT LOWER LEG   Dyslipidemia    Essential hypertension, benign    Insomnia    Low back pain    Obesity (BMI 30-39.9)    Obstructive sleep apnea on CPAP    Mild   PAF (paroxysmal atrial fibrillation) ([place])    Rare occurrences. On ASA & Flecainide prn   Pure hypercholesterolemia    On Lipitor    Right shoulder pain    Intermittent   Status post ablation of atrial flutter [id number]   Umbilical hernia    Surgical Hx: He  has a  past surgical history that includes right knee surgery (1996); coronary angiography (1997); ablation for atrial flutter (01/2002); Cardiac catheterization (12/26/01); Umbilical hernia repair (04/2015); Colonoscopy with propofol (N/A, 03/12/2016); Esophagogastroduodenoscopy (egd) with propofol (N/A, 10/15/2016); and LEFT HEART CATH AND CORONARY ANGIOGRAPHY (N/A, 02/11/2017).   Current Medications: Current Meds  Medication Sig   acetaminophen (TYLENOL) 325 MG tablet Take 325-650 mg by mouth every 6 (six) hours as needed (for pain.).   aspirin EC 81 MG tablet Take 1 tablet (81 mg total) by mouth daily.   atorvastatin (LIPITOR) 40 MG tablet TAKE 1 TABLET ONCE DAILY.   cetirizine (ZYRTEC) 10 MG tablet Take 10 mg by mouth at bedtime as needed for allergies.   flecainide (TAMBOCOR) 100 MG tablet Take 2 tablets (200 mg total) by mouth daily as needed (for afib).   glipiZIDE (GLUCOTROL XL) 2.5 MG 24 hr tablet Take 2.5 mg by mouth daily with breakfast.   hydrochlorothiazide (MICROZIDE) 12.5 MG capsule Take 1 capsule (12.5 mg total) by mouth daily.   Melatonin 3 MG TABS Take 1 tablet by mouth at bedtime as needed (sleep).   Multiple Vitamins-Minerals (MULTIVITAMIN WITH MINERALS) tablet Take 1 tablet by mouth daily.     nitroGLYCERIN (NITROSTAT) 0.4 MG SL tablet Place 1 tablet (0.4 mg total) under the tongue every 5 (five) minutes as needed.   olmesartan (BENICAR) 20 MG tablet Take 1 tablet (20 mg total) by mouth daily.   pantoprazole (PROTONIX) 40 MG tablet TAKE 1 TABLET BY MOUTH TWICE DAILY.   rivaroxaban (XARELTO) 20 MG TABS tablet TAKE 1 TABLET ONCE DAILY WITH DINNER.     Allergies:   Metformin and related   Social History   Tobacco Use   Smoking status: Never Smoker   Smokeless tobacco: Never Used  Substance Use Topics   Alcohol use: Yes    Alcohol/week: 7.0 - 10.0 standard drinks    Types: 7 - 10 Glasses of wine per week   Drug use: No     Family Hx: The patient's family history includes  COPD in his mother; Colon cancer (age of onset: 51) in his maternal grandfather; Diabetes in his mother; Diabetes (age of onset: 58) in his brother; Heart attack in his father and paternal grandfather; Heart disease in his mother; Heart disease (age of onset: 3) in his father; Heart failure in his father; Hypertension in his father; Hypertension (age of onset: 66) in his brother; Hypertension (age of onset: 8) in his mother. There is no history of Stroke.  Review of Systems  Please see the HPI. Constitution: Positive for malaise/fatigue.  Musculoskeletal: Positive for back pain.  Gastrointestinal: Negative for hematochezia and melena.  The rest of the ROS are negative  EKGs/Labs/Other Test Reviewed:    EKG:  EKG is   ordered today.  The ekg ordered today was personally reviewed and demonstrates normal sinus  rhythm, 1st degree AVB, normal axis, QTc 421, no ST-TW changes  Recent Labs: No results found for requested labs within last 8760 hours.   Recent Lipid Panel Lab Results  Component Value Date/Time   CHOL 133 10/05/2017 09:06 AM   TRIG 90 10/05/2017 09:06 AM   HDL 45 10/05/2017 09:06 AM   CHOLHDL 3.0 10/05/2017 09:06 AM   LDLCALC 70 10/05/2017 09:06 AM      HEAR Score (for undifferentiated chest pain):  HEAR Score: 5     Physical Exam:    VS:  BP 110/70   Pulse 78   Ht 6\' 1"  (1.854 m)   Wt 232 lb (105.2 kg)   BMI 30.61 kg/m     Wt Readings from Last 3 Encounters:  04/17/19 232 lb (105.2 kg)  10/31/18 228 lb (103.4 kg)  10/05/17 231 lb 6.4 oz (105 kg)     Constitutional:      Appearance: Healthy appearance. Not in distress.  Eyes:     Pupils: Pupils are equal, round, and reactive to light.  HENT:     Nose: Nose normal.  Neck:     Thyroid: Thyroid normal.     Vascular: JVD normal.     Lymphadenopathy: No cervical adenopathy.  Pulmonary:     Effort: Pulmonary effort is normal.     Breath sounds: No wheezing. No rales.  Chest:     Chest wall: Not tender to  palpatation.  Cardiovascular:     Normal rate. Regular rhythm. Normal S1. Normal S2.     Murmurs: There is no murmur.     No rub.  Edema:    Peripheral edema absent.  Abdominal:     Palpations: Abdomen is soft. There is no hepatomegaly.  Musculoskeletal: Normal range of motion.     Cervical back: Neck supple. Skin:    General: Skin is warm and dry.  Neurological:     General: No focal deficit present.     Mental Status: Alert and oriented to person, place and time.     Cranial Nerves: Cranial nerves are intact.  Psychiatric:        Mood and Affect: Affect normal.     ASSESSMENT & PLAN:    1. Coronary artery disease involving native coronary artery of native heart with unstable angina pectoris ([place]) 2. Shortness of breath He has a history of moderate nonobstructive coronary artery disease by cardiac catheterization in 2018.  At that time, he had fairly diffuse disease.  He is a diabetic.  He now presents with chest discomfort and shortness of breath with just minimal activity.  His chest discomfort has some typical and atypical features.  Some of it seems to be related to positional changes.  He has been under a lot of stress as his wife just had a brain tumor removed.  However, his lethargy and shortness of breath are unusual for him and quite concerning.  His ECG does not demonstrate any significant ischemic changes.  However, given his risk factors, I recommended admission to the hospital for cardiac catheterization.  I reviewed this with Dr. Tamala Julian who also saw the patient and agreed.  He has been short of breath.  He did report coughing up blood this morning.  However, this sounded more consistent with gum bleeding from brushing.  He has not been febrile and really has not had a significant cough.  He is on Rivaroxaban.  He will need a least 24 hours of washout before proceeding with cardiac  catheterization.  Risks and benefits of cardiac catheterization have been discussed with the  patient.  These include bleeding, infection, kidney damage, stroke, heart attack, death.  The patient understands these risks and is willing to proceed.   -Admit to [facility], cardiac telemetry  -COVID-19 test  -Check serial high-sensitivity troponin  -Obtain CMET, CBC with differential, sed rate, CRP  -Obtain CXR  -Obtain echocardiogram  -Proceed with cardiac catheterization tomorrow  3. Paroxysmal atrial fibrillation (HCC) He is currently maintaining sinus rhythm.  He did have 2 episodes of atrial fibrillation this past weekend.  He took flecainide to convert to sinus rhythm.  Therefore, we will place him on IV heparin while his Rivaroxaban is washing out.  4. Essential hypertension The patient's blood pressure is controlled on his current regimen.  Continue current therapy.   5. Pure hypercholesterolemia LDL optimal on most recent lab work.  Continue current Rx.    6. Diabetes mellitus Continue glipizide.  Obtain A1c.   Dispo:  Return for [facility] Follow Up.    Severity of Illness: The appropriate patient status for this patient is OBSERVATION. Observation status is judged to be reasonable and necessary in order to provide the required intensity of service to ensure the patient's safety. The patient's presenting symptoms, physical exam findings, and initial radiographic and laboratory data in the context of their medical condition is felt to place them at decreased risk for further clinical deterioration. Furthermore, it is anticipated that the patient will be medically stable for discharge from the hospital within 2 midnights of admission. The following factors support the patient status of observation.   " The patient's presenting symptoms include chest pain, shortness of breath, nausea. " The physical exam findings include n/a " The initial radiographic and laboratory data are pending.   For questions or updates, please contact [place] Please consult [email or web address]  for contact info under    Signed, Carlos Dopp, PA-C  04/17/2019 10:04 AM

## 2019-04-17 NOTE — Progress Notes (Signed)
ANTICOAGULATION CONSULT NOTE - Initial Consult  Pharmacy Consult for heparin Indication: atrial fibrillation (holding xarelto)  Allergies  Allergen Reactions  . Metformin And Related Nausea And Vomiting    Dizzy and disoriented     Patient Measurements:   Heparin Dosing Weight: 101kg  Vital Signs: BP: 110/70 (02/02 0842) Pulse Rate: 78 (02/02 0842)  Labs: No results for input(s): HGB, HCT, PLT, APTT, LABPROT, INR, HEPARINUNFRC, HEPRLOWMOCWT, CREATININE, CKTOTAL, CKMB, TROPONINIHS in the last 72 hours.  CrCl cannot be calculated (Patient's most recent lab result is older than the maximum 21 days allowed.).   Medical History: Past Medical History:  Diagnosis Date  . ACL tear    RIGHT KNEE  . Atrial fibrillation ([place]) 2012   Rare occurrences. On ASA & Flecainide prn  . Atrial flutter ([place])    s/p ablation 2008. On Flecainide prn.  . Broken leg 1968  . Concussion 1966   History of 4  . DVT (deep venous thrombosis) (HCC)    LEFT LOWER LEG  . Dyslipidemia   . Essential hypertension, benign   . Insomnia   . Low back pain   . Obesity (BMI 30-39.9)   . Obstructive sleep apnea on CPAP    Mild  . PAF (paroxysmal atrial fibrillation) ([place])    Rare occurrences. On ASA & Flecainide prn  . Pure hypercholesterolemia    On Lipitor   . Right shoulder pain    Intermittent  . Status post ablation of atrial flutter 2008  . Umbilical hernia    Assessment: 69 year old male with afib being admitted from cardiology office for chest pain. He had a couple episodes of afib this weekend that converted to nsr with flecainide (pill in the pocket). Patient on xarelto prior to admit, last dose was yesterday, cardiology will hold today and start heparin bridge.   Given recent xarelto use, will dose and monitor heparin based on aptt until they correlate.   Goal of Therapy:  Heparin level 0.3-0.7 units/ml aPTT 66-102 seconds Monitor platelets by anticoagulation protocol: Yes   Plan:   Will check baseline aptt and heparin level Start heparin infusion at 1400 units/hr Check anti-Xa level in 6 hours and daily while on heparin Continue to monitor H&H and platelets  Erin Hearing PharmD., BCPS Clinical Pharmacist 04/17/2019 3:04 PM

## 2019-04-17 NOTE — Progress Notes (Signed)
[place] for heparin Indication: atrial fibrillation  Allergies  Allergen Reactions  . Metformin And Related Nausea And Vomiting    Dizzy and disoriented     Patient Measurements: Height: 6\' 1"  (185.4 cm) Weight: 230 lb 3.2 oz (104.4 kg) IBW/kg (Calculated) : 79.9 Heparin Dosing Weight: 101kg  Vital Signs: Temp: 98.6 F (37 C) (02/02 1458) Temp Source: Oral (02/02 1458) BP: 113/78 (02/02 1458) Pulse Rate: 76 (02/02 1458)  Labs: Recent Labs    04/17/19 1626 04/17/19 1634 04/17/19 1831 04/17/19 2133  HGB  --  15.4  --   --   HCT  --  45.7  --   --   PLT  --  124*  --   --   APTT  --  39*  --  102*  LABPROT  --  17.2*  --   --   INR  --  1.4*  --   --   HEPARINUNFRC 1.74*  --   --   --   CREATININE  --  1.36*  --   --   TROPONINIHS  --  6 7  --     Estimated Creatinine Clearance: 66.9 mL/min (A) (by C-G formula based on SCr of 1.36 mg/dL (H)).  Assessment: 69 y.o. male with h/o Afib, Xarelto on hold, for heparin  Goal of Therapy:  Heparin level 0.3-0.7 units/ml aPTT 66-102 seconds Monitor platelets by anticoagulation protocol: Yes   Plan:  Continue Heparin at current rate   Phillis Knack, PharmD, BCPS  04/17/2019 11:02 PM

## 2019-04-17 NOTE — Progress Notes (Signed)
  Echocardiogram 2D Echocardiogram has been attempted. Patient leaving for procedure.  Carlos Gutierrez 04/17/2019, 3:13 PM

## 2019-04-17 NOTE — Patient Instructions (Signed)
Medication Instructions:   Your physician recommends that you continue on your current medications as directed. Please refer to the Current Medication list given to you today.  *If you need a refill on your cardiac medications before your next appointment, please call your pharmacy*  Lab Work:  None ordered today  If you have labs (blood work) drawn today and your tests are completely normal, you will receive your results only by: [name] MyChart Message (if you have MyChart) OR . A paper copy in the mail If you have any lab test that is abnormal or we need to change your treatment, we will call you to review the results.  Testing/Procedures:  None ordered today  Follow-Up:  Patient directly admitted to [facility]

## 2019-04-18 ENCOUNTER — Encounter (HOSPITAL_COMMUNITY)
Admission: AD | Disposition: A | Discharge status: Home / Self Care | Payer: Self-pay | Source: Ambulatory Visit | Attending: Interventional Cardiology

## 2019-04-18 ENCOUNTER — Observation Stay (HOSPITAL_BASED_OUTPATIENT_CLINIC_OR_DEPARTMENT_OTHER): Payer: BC Managed Care – PPO

## 2019-04-18 ENCOUNTER — Ambulatory Visit (HOSPITAL_COMMUNITY)
Admission: RE | Admit: 2019-04-18 | Payer: BC Managed Care – PPO | Source: Home / Self Care | Admitting: Interventional Cardiology

## 2019-04-18 ENCOUNTER — Other Ambulatory Visit: Payer: Self-pay

## 2019-04-18 DIAGNOSIS — I25110 Atherosclerotic heart disease of native coronary artery with unstable angina pectoris: Principal | ICD-10-CM

## 2019-04-18 DIAGNOSIS — R0602 Shortness of breath: Secondary | ICD-10-CM

## 2019-04-18 DIAGNOSIS — I10 Essential (primary) hypertension: Secondary | ICD-10-CM

## 2019-04-18 DIAGNOSIS — I480 Paroxysmal atrial fibrillation: Secondary | ICD-10-CM | POA: Diagnosis not present

## 2019-04-18 HISTORY — PX: CARDIAC CATHETERIZATION: SHX172

## 2019-04-18 HISTORY — PX: LEFT HEART CATH AND CORONARY ANGIOGRAPHY: CATH118249

## 2019-04-18 LAB — CBC
HCT: 44.4 % (ref 39.0–52.0)
Hemoglobin: 14.9 g/dL (ref 13.0–17.0)
MCH: 30.3 pg (ref 26.0–34.0)
MCHC: 33.6 g/dL (ref 30.0–36.0)
MCV: 90.2 fL (ref 80.0–100.0)
Platelets: 113 10*3/uL — ABNORMAL LOW (ref 150–400)
RBC: 4.92 MIL/uL (ref 4.22–5.81)
RDW: 14.5 % (ref 11.5–15.5)
WBC: 5.2 10*3/uL (ref 4.0–10.5)
nRBC: 0 % (ref 0.0–0.2)

## 2019-04-18 LAB — BASIC METABOLIC PANEL
Anion gap: 15 (ref 5–15)
BUN: 17 mg/dL (ref 8–23)
CO2: 24 mmol/L (ref 22–32)
Calcium: 9.1 mg/dL (ref 8.9–10.3)
Chloride: 101 mmol/L (ref 98–111)
Creatinine, Ser: 1.26 mg/dL — ABNORMAL HIGH (ref 0.61–1.24)
GFR calc Af Amer: >60 mL/min (ref >60)
GFR calc non Af Amer: 59 mL/min — ABNORMAL LOW (ref >60)
Glucose, Bld: 137 mg/dL — ABNORMAL HIGH (ref 70–99)
Potassium: 4.2 mmol/L (ref 3.5–5.1)
Sodium: 140 mmol/L (ref 135–145)

## 2019-04-18 LAB — ECHOCARDIOGRAM COMPLETE
Height: 73 in
Weight: 3668.45 oz

## 2019-04-18 LAB — GLUCOSE, CAPILLARY
Glucose-Capillary: 134 mg/dL — ABNORMAL HIGH (ref 70–99)
Glucose-Capillary: 116 mg/dL — ABNORMAL HIGH (ref 70–99)
Glucose-Capillary: 151 mg/dL — ABNORMAL HIGH (ref 70–99)
Glucose-Capillary: 133 mg/dL — ABNORMAL HIGH (ref 70–99)

## 2019-04-18 LAB — HEPARIN LEVEL (UNFRACTIONATED): Heparin Unfractionated: 1.48 IU/mL — ABNORMAL HIGH (ref 0.30–0.70)

## 2019-04-18 LAB — MAGNESIUM: Magnesium: 2.1 mg/dL (ref 1.7–2.4)

## 2019-04-18 SURGERY — LEFT HEART CATH AND CORONARY ANGIOGRAPHY
Anesthesia: LOCAL

## 2019-04-18 MED ORDER — ADULT MULTIVITAMIN W/MINERALS CH
1.0000 | ORAL_TABLET | Freq: Every day | ORAL | Status: DC
  Administered 2019-04-19 – 2019-04-22 (×4): 1 via ORAL
  Filled 2019-04-18 (×4): qty 1

## 2019-04-18 MED ORDER — FENTANYL CITRATE (PF) 100 MCG/2ML IJ SOLN
INTRAMUSCULAR | Status: AC
  Filled 2019-04-18: qty 2

## 2019-04-18 MED ORDER — MELATONIN 3 MG PO TABS
1.0000 | ORAL_TABLET | Freq: Every evening | ORAL | Status: DC | PRN
  Filled 2019-04-18: qty 1

## 2019-04-18 MED ORDER — SODIUM CHLORIDE 0.9 % IV SOLN: 250.0000 mL | INTRAVENOUS | Status: DC | PRN

## 2019-04-18 MED ORDER — GLIPIZIDE ER 2.5 MG PO TB24
2.5000 mg | ORAL_TABLET | Freq: Every day | ORAL | Status: DC
  Administered 2019-04-19 – 2019-04-22 (×4): 2.5 mg via ORAL
  Filled 2019-04-18 (×5): qty 1

## 2019-04-18 MED ORDER — NITROGLYCERIN 0.4 MG SL SUBL: 0.4000 mg | SUBLINGUAL_TABLET | SUBLINGUAL | Status: DC | PRN

## 2019-04-18 MED ORDER — SODIUM CHLORIDE 0.9 % IV SOLN: INTRAVENOUS | Status: AC

## 2019-04-18 MED ORDER — INSULIN ASPART 100 UNIT/ML ~~LOC~~ SOLN: 0.0000 [IU] | Freq: Three times a day (TID) | SUBCUTANEOUS | Status: DC

## 2019-04-18 MED ORDER — IRBESARTAN 150 MG PO TABS
150.0000 mg | ORAL_TABLET | Freq: Every day | ORAL | Status: DC
  Administered 2019-04-19 – 2019-04-22 (×4): 150 mg via ORAL
  Filled 2019-04-18 (×4): qty 1

## 2019-04-18 MED ORDER — MIDAZOLAM HCL 2 MG/2ML IJ SOLN
INTRAMUSCULAR | Status: DC | PRN
  Administered 2019-04-18 (×2): 1 mg via INTRAVENOUS

## 2019-04-18 MED ORDER — VERAPAMIL HCL 2.5 MG/ML IV SOLN
INTRAVENOUS | Status: DC | PRN
  Administered 2019-04-18: 10 mL via INTRA_ARTERIAL

## 2019-04-18 MED ORDER — HEPARIN SODIUM (PORCINE) 1000 UNIT/ML IJ SOLN
INTRAMUSCULAR | Status: DC | PRN
  Administered 2019-04-18: 5000 [IU] via INTRAVENOUS

## 2019-04-18 MED ORDER — HEPARIN (PORCINE) IN NACL 1000-0.9 UT/500ML-% IV SOLN
INTRAVENOUS | Status: AC
  Filled 2019-04-18: qty 1000

## 2019-04-18 MED ORDER — FENTANYL CITRATE (PF) 100 MCG/2ML IJ SOLN
INTRAMUSCULAR | Status: DC | PRN
  Administered 2019-04-18: 25 ug via INTRAVENOUS

## 2019-04-18 MED ORDER — LORATADINE 10 MG PO TABS
10.0000 mg | ORAL_TABLET | Freq: Every day | ORAL | Status: DC
  Administered 2019-04-19 – 2019-04-22 (×4): 10 mg via ORAL
  Filled 2019-04-18 (×4): qty 1

## 2019-04-18 MED ORDER — ACETAMINOPHEN 325 MG PO TABS
650.0000 mg | ORAL_TABLET | ORAL | Status: DC | PRN
  Administered 2019-04-19 – 2019-04-21 (×4): 650 mg via ORAL
  Filled 2019-04-18 (×4): qty 2

## 2019-04-18 MED ORDER — VERAPAMIL HCL 2.5 MG/ML IV SOLN
INTRAVENOUS | Status: AC
  Filled 2019-04-18: qty 2

## 2019-04-18 MED ORDER — HYDRALAZINE HCL 20 MG/ML IJ SOLN: 10.0000 mg | INTRAMUSCULAR | Status: AC | PRN

## 2019-04-18 MED ORDER — IOHEXOL 350 MG/ML SOLN
INTRAVENOUS | Status: DC | PRN
  Administered 2019-04-18: 90 mL

## 2019-04-18 MED ORDER — OXYCODONE HCL 5 MG PO TABS: 5.0000 mg | ORAL_TABLET | ORAL | Status: DC | PRN

## 2019-04-18 MED ORDER — MIDAZOLAM HCL 2 MG/2ML IJ SOLN
INTRAMUSCULAR | Status: AC
  Filled 2019-04-18: qty 2

## 2019-04-18 MED ORDER — ONDANSETRON HCL 4 MG/2ML IJ SOLN: 4.0000 mg | Freq: Four times a day (QID) | INTRAMUSCULAR | Status: DC | PRN

## 2019-04-18 MED ORDER — LIDOCAINE HCL (PF) 1 % IJ SOLN
INTRAMUSCULAR | Status: AC
  Filled 2019-04-18: qty 30

## 2019-04-18 MED ORDER — PERFLUTREN LIPID MICROSPHERE
1.0000 mL | INTRAVENOUS | Status: AC | PRN
  Administered 2019-04-18: 4 mL via INTRAVENOUS
  Filled 2019-04-18: qty 10

## 2019-04-18 MED ORDER — HEPARIN (PORCINE) IN NACL 1000-0.9 UT/500ML-% IV SOLN
INTRAVENOUS | Status: DC | PRN
  Administered 2019-04-18 (×2): 500 mL

## 2019-04-18 MED ORDER — SODIUM CHLORIDE 0.9% FLUSH: 3.0000 mL | INTRAVENOUS | Status: DC | PRN

## 2019-04-18 MED ORDER — ATORVASTATIN CALCIUM 80 MG PO TABS
80.0000 mg | ORAL_TABLET | Freq: Every day | ORAL | Status: DC
  Administered 2019-04-18 – 2019-04-21 (×4): 80 mg via ORAL
  Filled 2019-04-18 (×4): qty 1

## 2019-04-18 MED ORDER — LABETALOL HCL 5 MG/ML IV SOLN: 10.0000 mg | INTRAVENOUS | Status: AC | PRN

## 2019-04-18 MED ORDER — SODIUM CHLORIDE 0.9% FLUSH
3.0000 mL | Freq: Two times a day (BID) | INTRAVENOUS | Status: DC
  Administered 2019-04-19 – 2019-04-22 (×5): 3 mL via INTRAVENOUS

## 2019-04-18 MED ORDER — HEPARIN SODIUM (PORCINE) 1000 UNIT/ML IJ SOLN
INTRAMUSCULAR | Status: AC
  Filled 2019-04-18: qty 1

## 2019-04-18 MED ORDER — RIVAROXABAN 20 MG PO TABS
20.0000 mg | ORAL_TABLET | Freq: Every day | ORAL | Status: DC
  Administered 2019-04-18: 20 mg via ORAL
  Filled 2019-04-18: qty 1

## 2019-04-18 MED ORDER — METOPROLOL TARTRATE 12.5 MG HALF TABLET: 12.5000 mg | ORAL_TABLET | Freq: Two times a day (BID) | ORAL | Status: DC

## 2019-04-18 MED ORDER — LIDOCAINE HCL (PF) 1 % IJ SOLN
INTRAMUSCULAR | Status: DC | PRN
  Administered 2019-04-18: 2 mL

## 2019-04-18 SURGICAL SUPPLY — 11 items
CATH 5FR JL3.5 JR4 ANG PIG MP (CATHETERS) ×1 IMPLANT
CATH OPTITORQUE TIG 4.0 5F (CATHETERS) ×1 IMPLANT
DEVICE RAD COMP TR BAND LRG (VASCULAR PRODUCTS) ×1 IMPLANT
GLIDESHEATH SLEND A-KIT 6F 22G (SHEATH) ×2 IMPLANT
GUIDEWIRE INQWIRE 1.5J.035X260 (WIRE) IMPLANT
INQWIRE 1.5J .035X260CM (WIRE) ×2
KIT HEART LEFT (KITS) ×2 IMPLANT
PACK CARDIAC CATHETERIZATION (CUSTOM PROCEDURE TRAY) ×2 IMPLANT
SHEATH PROBE COVER 6X72 (BAG) ×1 IMPLANT
TRANSDUCER W/STOPCOCK (MISCELLANEOUS) ×2 IMPLANT
TUBING CIL FLEX 10 FLL-RA (TUBING) ×2 IMPLANT

## 2019-04-18 NOTE — Interval H&P Note (Signed)
Cath Lab Visit (complete for each Cath Lab visit)  Clinical Evaluation Leading to the Procedure:   ACS: No.  Non-ACS:    Anginal Classification: CCS Gutierrez  Anti-ischemic medical therapy: Minimal Therapy (1 class of medications)  Non-Invasive Test Results: No non-invasive testing performed  Prior CABG: No previous CABG      History and Physical Interval Note:  04/18/2019 12:19 PM  Carlos Gutierrez  has presented today for surgery, with the diagnosis of n stemi.  The various methods of treatment have been discussed with the patient and family. After consideration of risks, benefits and other options for treatment, the patient has consented to  Procedure(s): LEFT HEART CATH AND CORONARY ANGIOGRAPHY (N/A) as a surgical intervention.  The patient's history has been reviewed, patient examined, no change in status, stable for surgery.  I have reviewed the patient's chart and labs.  Questions were answered to the patient's satisfaction.     Carlos Gutierrez

## 2019-04-18 NOTE — Consult Note (Addendum)
Cardiology Consultation:   Patient ID: AHARON WENTWORTH MRN: [id number]; DOB: 03-Mar-1951  Admit date: 04/17/2019 Date of Consult: 04/18/2019  Primary Care Provider: London Pepper, MD Primary Cardiologist: Sinclair Grooms, MD  Primary Electrophysiologist:  Cristopher Peru, MD (last in 2016)   Patient Profile:   MARKEVIOUS ZALOUDEK is a 69 y.o. male with a hx of CAD (non-obstructive historically), HTN, HLD, DM, AFlutter (ablated 2003), and AFib, DVT, OSA w/CPAP, who is being seen today for the evaluation of AFib and AAD recommendations at the request of Dr. Ellyn Hack.  History of Present Illness:   Mr. Vorwerk was seen in the office yesterday with c/o CP/aching and DOE, some nausea as well on/off for about 2 weeks. He mentioned yesterday morning after brushing his teeth noted blood, though denied coughing up blood.   He also mentioned having had a couple AFib episodes resolved with his "pill in the pocket" flecainide.  He was admitted to the hospital for further evaluation of his symptoms.  Here he has converted to rate controlled Afib.  He underwent LHC today noting   30 to 40% distal left main, which is new/progressed from 2018.  Luminal irregularities in the LAD mid and distal.  First obtuse marginal 50% tandem proximal to mid stenoses.  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  Unchanged compared to prior images.  EP is asked to evaluate rhythm control, AAD management given increased Afib burden of late and some progression of non-obstructive CAD.  He tells me he can tell he in afib making him sluggish, weaker, and an awareness of his heart beating irregular.  His AFib burden is about 2-3x a month, which has been pretty tolerable for him, the flecainide working well.  In the last week he has had 3 episodes.  He exercises regularly and when in SR has very good exertional capacity.     LABS K+ 3.8 > 4.2 BUN/Creat 19/1.36 >  17/1.26 HS Trop 6 > 7 WBC 3.7 > 5.2 H/H 15/45 > 14/44 Plts 124 > 113 TSH 2.987  COVID negative   Home AAD Flecainide 100mg  2 tablets, daily PRN for palpitations  A/c Xarelto, appropriately dosed   Heart Pathway Score:  HEAR Score: 5  Past Medical History:  Diagnosis Date  . ACL tear    RIGHT KNEE  . Atrial fibrillation ([place]) 2012   Rare occurrences. On ASA & Flecainide prn  . Atrial flutter ([place])    s/p ablation 2008. On Flecainide prn.  . Broken leg 1968  . Concussion 1966   History of 4  . DVT (deep venous thrombosis) ([facility])    LEFT LOWER LEG  . Dyslipidemia   . Essential hypertension, benign   . Insomnia   . Low back pain   . Obesity (BMI 30-39.9)   . Obstructive sleep apnea on CPAP    Mild  . PAF (paroxysmal atrial fibrillation) ([place])    Rare occurrences. On ASA & Flecainide prn  . Pure hypercholesterolemia    On Lipitor   . Right shoulder pain    Intermittent  . Status post ablation of atrial flutter 2008  . Umbilical hernia     Past Surgical History:  Procedure Laterality Date  . ablation for atrial flutter  01/2002  . CARDIAC CATHETERIZATION  12/26/01  . COLONOSCOPY WITH PROPOFOL N/A 03/12/2016   Procedure: COLONOSCOPY WITH PROPOFOL;  Surgeon: [name], MD;  Location: [facility];  Service: Endoscopy;  Laterality: N/A;  . coronary angiography  1997   40% stenosis  . ESOPHAGOGASTRODUODENOSCOPY (EGD) WITH PROPOFOL N/A 10/15/2016   Procedure: ESOPHAGOGASTRODUODENOSCOPY (EGD) WITH PROPOFOL;  Surgeon: [name], MD;  Location: [facility];  Service: Endoscopy;  Laterality: N/A;  . LEFT HEART CATH AND CORONARY ANGIOGRAPHY N/A 02/11/2017   Procedure: LEFT HEART CATH AND CORONARY ANGIOGRAPHY;  Surgeon: Belva Crome, MD;  Location: [facility];  Service: Cardiovascular;  Laterality: N/A;  . LEFT HEART CATH AND CORONARY ANGIOGRAPHY N/A 04/18/2019   Procedure: LEFT HEART CATH AND CORONARY ANGIOGRAPHY;  Surgeon: Belva Crome, MD;  Location: [facility];  Service: Cardiovascular;  Laterality: N/A;  . right knee surgery  1996   ACL, meniscus problems  . UMBILICAL HERNIA REPAIR  04/2015     Home Medications:  Prior to Admission medications   Medication Sig Start Date End Date Taking? Authorizing Provider  acetaminophen (TYLENOL) 500 MG tablet Take 1,000 mg by mouth every 6 (six) hours as needed for moderate pain or headache.   Yes [provider]  Ascorbic Acid (VITAMIN C) 1000 MG tablet Take 1,000 mg by mouth daily.   Yes [provider]  aspirin EC 81 MG tablet Take 1 tablet (81 mg total) by mouth daily. 09/22/16  Yes Hosie Poisson, MD  atorvastatin (LIPITOR) 40 MG tablet TAKE 1 TABLET ONCE DAILY. Patient taking differently: Take 40 mg by mouth every evening.  01/22/19  Yes Belva Crome, MD  cetirizine (ZYRTEC) 10 MG tablet Take 10 mg by mouth at bedtime as needed for allergies.   Yes [provider]  Cholecalciferol (VITAMIN D) 50 MCG (2000 UT) tablet Take 2,000 Units by mouth daily.   Yes [provider]  flecainide (TAMBOCOR) 100 MG tablet Take 2 tablets (200 mg total) by mouth daily as needed (for afib). 10/31/18  Yes Weaver, [name] T, PA-C  glipiZIDE (GLUCOTROL XL) 2.5 MG 24 hr tablet Take 2.5 mg by mouth daily with breakfast.   Yes [provider]  loperamide (IMODIUM A-D) 2 MG tablet Take 2 mg by mouth 4 (four) times daily as needed for diarrhea or loose stools.   Yes [provider]  Melatonin 5 MG CAPS Take 5 mg by mouth at bedtime as needed (sleep).   Yes [provider]  Multiple Vitamins-Minerals (MULTIVITAMIN WITH MINERALS) tablet Take 1 tablet by mouth daily.     Yes [provider]  nitroGLYCERIN (NITROSTAT) 0.4 MG SL tablet Place 1 tablet (0.4 mg total) under the tongue every 5 (five) minutes as needed. Patient taking differently: Place 0.4 mg under the tongue every 5 (five) minutes as needed for chest pain.  03/04/17 04/17/19 Yes Isaiah Serge, NP  olmesartan-hydrochlorothiazide (BENICAR HCT) 20-12.5 MG tablet Take 1 tablet by mouth daily.   Yes [provider]  pantoprazole (PROTONIX) 40 MG tablet TAKE 1 TABLET BY MOUTH TWICE DAILY. Patient taking differently: Take 40 mg by mouth 2 (two) times daily.  12/26/18  Yes Belva Crome, MD  rivaroxaban (XARELTO) 20 MG TABS tablet TAKE 1 TABLET ONCE DAILY WITH DINNER. Patient taking differently: Take 20 mg by mouth daily with supper.  11/22/18  Yes Weaver, [name] T, PA-C  hydrochlorothiazide (MICROZIDE) 12.5 MG capsule Take 1 capsule (12.5 mg total) by mouth daily. Patient not taking: Reported on 04/17/2019 12/16/17 04/17/19  Belva Crome, MD  olmesartan (BENICAR) 20 MG tablet Take 1 tablet (20 mg total) by mouth daily. Patient not taking:  Reported on 04/17/2019 10/31/18   Liliane Shi, PA-C    Inpatient Medications: Scheduled Meds: . atorvastatin  80 mg Oral q1800  . [START ON 04/19/2019] glipiZIDE  2.5 mg Oral Q breakfast  . insulin aspart  0-15 Units Subcutaneous TID WC  . [START ON 04/19/2019] irbesartan  150 mg Oral Daily  . [START ON 04/19/2019] loratadine  10 mg Oral Daily  . metoprolol tartrate  12.5 mg Oral BID  . [START ON 04/19/2019] multivitamin with minerals  1 tablet Oral Daily  . rivaroxaban  20 mg Oral Daily  . sodium chloride flush  3 mL Intravenous Q12H   Continuous Infusions: . sodium chloride    . sodium chloride     PRN Meds: sodium chloride, acetaminophen, hydrALAZINE, labetalol, Melatonin, nitroGLYCERIN, ondansetron (ZOFRAN) IV, oxyCODONE, sodium chloride flush  Allergies:    Allergies  Allergen Reactions  . Metformin And Related Nausea And Vomiting    Dizzy and disoriented     Social History:   Social History   Socioeconomic History  . Marital status: Married    Spouse name: Not on file  . Number of children: 2  . Years of education: Not on file  . Highest education level: Not on file  Occupational History  . Occupation: Counselling psychologist: [employer]  Tobacco Use  . Smoking status: Never Smoker  . Smokeless tobacco: Never Used  Substance and Sexual Activity  . Alcohol use: Yes    Alcohol/week: 7.0 - 10.0 standard drinks    Types: 7 - 10 Glasses of wine per week  . Drug use: No  . Sexual activity: Not on file  Other Topics Concern  . Not on file  Social History Narrative   Caffeine: yes, coffee 2-3 cups daily, tea-rare.    Exercise-yes, 2-3X weekly, jog/run.    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    Marital Status: Married.    Children: 2 children-grown               Social Determinants of Health   Financial Resource Strain:   . Difficulty of Paying Living Expenses: Not on file  Food Insecurity:   . Worried About Charity fundraiser in the Last Year: Not on file  . Ran Out of Food in the Last Year: Not on file  Transportation Needs:   . Lack of Transportation (Medical): Not on file  . Lack of Transportation (Non-Medical): Not on file  Physical Activity:   . Days of Exercise per Week: Not on file  . Minutes of Exercise per Session: Not on file  Stress:   . Feeling of Stress : Not on file  Social Connections:   . Frequency of Communication with Friends and Family: Not on file  . Frequency of Social Gatherings with Friends and Family: Not on file  . Attends Religious Services: Not on file  . Active Member of Clubs or Organizations: Not on file  . Attends Archivist Meetings: Not on file  . Marital Status: Not on file  Intimate Partner Violence:   . Fear of Current or Ex-Partner: Not on file  . Emotionally Abused: Not on file  . Physically Abused: Not on file  . Sexually Abused: Not on file    Family History:   Family History  Problem Relation Age of Onset  . Hypertension Mother 33  . Diabetes Mother   . COPD Mother   . Heart  disease Mother   . Heart disease Father 53  . Hypertension Father   . Heart failure Father    . Heart attack Father   . Diabetes Brother 46  . Hypertension Brother 71  . Heart attack Paternal Grandfather   . Colon cancer Maternal Grandfather 31  . Stroke Neg Hx      ROS:  Please see the history of present illness.  All other ROS reviewed and negative.     Physical Exam/Data:   Vitals:   04/18/19 1253 04/18/19 1258 04/18/19 1303 04/18/19 1321  BP: 99/65 100/68 100/64 105/65  Pulse: 66 (!) 56 (!) 55 (!) 54  Resp: 17 15 19    Temp:      TempSrc:      SpO2: 99% 99% (!) 0% 97%  Weight:      Height:        Intake/Output Summary (Last 24 hours) at 04/18/2019 1415 Last data filed at 04/18/2019 0800 Gross per 24 hour  Intake 180.28 ml  Output 700 ml  Net -519.72 ml   Last 3 Weights 04/18/2019 04/17/2019 04/17/2019  Weight (lbs) 229 lb 4.5 oz 230 lb 3.2 oz 232 lb  Weight (kg) 104 kg 104.418 kg 105.235 kg     Body mass index is 30.25 kg/m.  General:  Well nourished, well developed, in no acute distress HEENT: normal Lymph: no adenopathy Neck: no JVD Endocrine:  No thryomegaly Vascular: No carotid bruits;  Cardiac:  irreg-irreg; no murmurs, gallops or rubs Lungs:  CTA b/l, no wheezing, rhonchi or rales  Abd: soft, nontender, obese  Ext: no edema Musculoskeletal:  No deformities Skin: warm and dry  Neuro:  No gross focal abnormalities noted Psych:  Normal affect     EKG:  The EKG was personally reviewed and demonstrates:   Afib 50bpm, no acute changes otherwise, QT 444, QTc 480ms 10/31/2018 SB 53bpm, 1st degree Avblock, PR 231ms, QT 43ms/QTc 423ms  Telemetry:  Telemetry was personally reviewed and demonstrates:   SR/SB with nocturnal intermittent junctional with rates 40's >> AFib 50's    Relevant CV Studies:   04/18/2019: TTE IMPRESSIONS  1. Left ventricular ejection fraction, by visual estimation, is 50 to  55%. The left ventricle has normal function. There is no left ventricular  hypertrophy.  2. Definity contrast agent was given IV to delineate the left  ventricular  endocardial borders.  3. The left ventricle has no regional wall motion abnormalities.  4. Left ventricular diastolic parameters are indeterminate.  5. Global right ventricle has normal systolic function.The right  ventricular size is not well visualized. Right vetricular wall thickness  was not assessed.  6. Left atrial size was normal.  7. Right atrial size was normal.  8. The mitral valve is normal in structure. No evidence of mitral valve  regurgitation. No evidence of mitral stenosis.  9. The tricuspid valve is normal in structure. Tricuspid valve  regurgitation is trivial.  10. The aortic valve is grossly normal. Aortic valve regurgitation is not  visualized. No evidence of aortic valve sclerosis or stenosis.  11. The pulmonic valve was normal in structure. Pulmonic valve  regurgitation is trivial.  12. The inferior vena cava is normal in size with greater than 50%  respiratory variability, suggesting right atrial pressure of 3 mmHg.  13. TR signal is inadequate for assessing pulmonary artery systolic  pressure.    04/18/2019: LHC  30 to 40% distal left main, which is new/progressed from 2018.  Luminal irregularities in the LAD  mid and distal.  First obtuse marginal 50% tandem proximal to mid stenoses.  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  Unchanged compared to prior images.  Normal left ventricular function with EF 55%.  LVEDP normal.  Atrial fibrillation developed during the night.  He is asymptomatic and did not know he was in atrial fib.  Recommendations:  Continue aggressive risk factor modification.  Increase statin intensity to max dose atorvastatin, 80 mg/day.  Consider adding antiarrhythmic therapy continuously to control atrial for rhythm.  Perhaps some of his symptoms are related to undetected atrial fibrillation.    Laboratory Data:  High Sensitivity Troponin:     Recent Labs  Lab 04/17/19 1634 04/17/19 1831  TROPONINIHS 6 7     Chemistry Recent Labs  Lab 04/17/19 1634 04/18/19 0804  NA 139 140  K 3.8 4.2  CL 103 101  CO2 26 24  GLUCOSE 103* 137*  BUN 19 17  CREATININE 1.36* 1.26*  CALCIUM 9.1 9.1  GFRNONAA 53* 59*  GFRAA >60 >60  ANIONGAP 10 15    Recent Labs  Lab 04/17/19 1634  PROT 6.9  ALBUMIN 4.0  AST 44*  ALT 60*  ALKPHOS 82  BILITOT 1.7*   Hematology Recent Labs  Lab 04/17/19 1634 04/18/19 0427  WBC 3.7* 5.2  RBC 5.04 4.92  HGB 15.4 14.9  HCT 45.7 44.4  MCV 90.7 90.2  MCH 30.6 30.3  MCHC 33.7 33.6  RDW 14.6 14.5  PLT 124* 113*   BNP Recent Labs  Lab 04/17/19 1626  BNP 46.2    DDimer No results for input(s): DDIMER in the last 168 hours.   Radiology/Studies:   X-ray chest PA and lateral Result Date: 04/17/2019 CLINICAL DATA:  69 year old male with shortness of breath. EXAM: CHEST - 2 VIEW COMPARISON:  Chest radiograph dated 09/21/2016. FINDINGS: Minimal bibasilar linear atelectasis/scarring. No focal consolidation, pleural effusion, pneumothorax. The cardiac silhouette is within normal limits. No acute osseous pathology. IMPRESSION: No active cardiopulmonary disease. Electronically Signed   By: Anner Crete M.D.   On: 04/17/2019 15:40     Assessment and Plan:   1. Paroxysmal AFib     CHA2DS2Vasc is 5, on Xarelto, appropriately dosed     To now managed with pill in the pocket approach with Flecainide.  He has had some progression/new CAD, non-obstructive however.  Including 50-70% RCA lesion (this similar to 2018), new/progressed LM disease 30-40% Given his cath, I don't think flecainide will be the right choice going forward He has baseline bradycardia Tikosyn looks like a reasonable next AAD choice.  He has not had interruption in his a/c taking his xarelto last evening and on heparin gtt here. He is though on HCTZ, having had a dose yesterday AM at home We would need 48 hrs off HCTZ to  get Tikosyn started, this could be a start date of tomorrow evening possibly or Friday or come back in at a later date given he is very well rate controlled.  He is also a good candidate for an AFib ablation.  His LA is described as normal in size, measured 3mm by his echo here.  He would like to consider both options, though I think leaning towards an ablation.  Dr. Lovena Le will see him later today.  I will discuss with him, perhaps have Dr. Curt Bears see him.   For questions or updates, please contact [place] Please consult [email or web address] for contact info under  Signed, Baldwin Jamaica, PA-C  04/18/2019 2:15 PM   EP Attending  Patient seen and examined. Agree with the findings as noted above. The patient has done well for many years with a pill in the pocket flecainide. He has a remote h/o atrial flutter and is s/p catheter ablation. He has developed persistent atrial fib. In light of his progressive CAD, bid flecainide probably not the best choice. I carefully discussed catheter ablation and dofetilide The patient has been on HCTZ and we will need to hold the initiation of dofetilide until the p.m. of 2/4. I also discussed referral for atrial fib ablation. He would like to start out with dofetilide.   Mikle Bosworth.D.

## 2019-04-18 NOTE — H&P (View-Only) (Signed)
Progress Note  Patient Name: Carlos Gutierrez Date of Encounter: 04/18/2019  Primary Cardiologist: [name], MD   Subjective   Mild episode of chest pain this morning. Converted to Afib around 1:30am.   Inpatient Medications    Scheduled Meds: . aspirin  81 mg Oral Pre-Cath  . aspirin EC  81 mg Oral Daily  . atorvastatin  40 mg Oral Daily  . glipiZIDE  2.5 mg Oral Q breakfast  . hydrochlorothiazide  12.5 mg Oral Daily  . insulin aspart  0-15 Units Subcutaneous TID WC  . irbesartan  150 mg Oral Daily  . loratadine  10 mg Oral Daily  . metoprolol tartrate  12.5 mg Oral BID  . multivitamin with minerals  1 tablet Oral Daily  . pantoprazole  40 mg Oral BID  . sodium chloride flush  3 mL Intravenous Q12H   Continuous Infusions: . sodium chloride    . sodium chloride 1 mL/kg/hr (04/18/19 0011)  . heparin 1,400 Units/hr (04/18/19 0400)   PRN Meds: sodium chloride, acetaminophen, flecainide, Melatonin, nitroGLYCERIN, sodium chloride flush   Vital Signs    Vitals:   04/17/19 1458 04/17/19 1500 04/18/19 0612 04/18/19 0817  BP: 113/78  108/80 117/77  Pulse: 76  (!) 55   Resp:   18   Temp: 98.6 F (37 C)  98.6 F (37 C)   TempSrc: Oral  Oral   SpO2: 99%  100%   Weight:  104.4 kg 104 kg   Height:  6\' 1"  (1.854 m)      Intake/Output Summary (Last 24 hours) at 04/18/2019 0900 Last data filed at 04/18/2019 0800 Gross per 24 hour  Intake 180.28 ml  Output 700 ml  Net -519.72 ml   Last 3 Weights 04/18/2019 04/17/2019 04/17/2019  Weight (lbs) 229 lb 4.5 oz 230 lb 3.2 oz 232 lb  Weight (kg) 104 kg 104.418 kg 105.235 kg      Telemetry    SR--> Afib rate controlled around 1:30am - Personally Reviewed  ECG    No recent tracing  Physical Exam  Pleasant older WM, sitting up in bed GEN: No acute distress.   Neck: No JVD Cardiac: Irreg Irreg, no murmurs, rubs, or gallops.  Respiratory: Clear to auscultation bilaterally. GI: Soft, nontender, non-distended  MS: No  edema; No deformity. Neuro:  Nonfocal  Psych: Normal affect   Labs    High Sensitivity Troponin:   Recent Labs  Lab 04/17/19 1634 04/17/19 1831  TROPONINIHS 6 7      Chemistry Recent Labs  Lab 04/17/19 1634  NA 139  K 3.8  CL 103  CO2 26  GLUCOSE 103*  BUN 19  CREATININE 1.36*  CALCIUM 9.1  PROT 6.9  ALBUMIN 4.0  AST 44*  ALT 60*  ALKPHOS 82  BILITOT 1.7*  GFRNONAA 53*  GFRAA >60  ANIONGAP 10     Hematology Recent Labs  Lab 04/17/19 1634 04/18/19 0427  WBC 3.7* 5.2  RBC 5.04 4.92  HGB 15.4 14.9  HCT 45.7 44.4  MCV 90.7 90.2  MCH 30.6 30.3  MCHC 33.7 33.6  RDW 14.6 14.5  PLT 124* 113*    BNP Recent Labs  Lab 04/17/19 1626  BNP 46.2     DDimer No results for input(s): DDIMER in the last 168 hours.   Radiology    X-ray chest PA and lateral  Result Date: 04/17/2019 CLINICAL DATA:  69 year old male with shortness of breath. EXAM: CHEST - 2 VIEW COMPARISON:  Chest radiograph dated 09/21/2016. FINDINGS: Minimal bibasilar linear atelectasis/scarring. No focal consolidation, pleural effusion, pneumothorax. The cardiac silhouette is within normal limits. No acute osseous pathology. IMPRESSION: No active cardiopulmonary disease. Electronically Signed   By: Anner Crete M.D.   On: 04/17/2019 15:40    Cardiac Studies   TTE: pending read  Patient Profile     69 y.o. male with PMH of Aflutter s/p ablation, nonobstructive CAD, HTN, HL, OSA, Hx of DVT, DM who presented to the office with chest pain. Sent for direct admission with plans for cardiac cath.   Assessment & Plan    1. Unstable Angina/dyspnea: presented to the office yesterday with symptoms and sent for direct admission with plans for cardiac cath. Brief episode of chest pain early this morning, may have been his conversion to Afib? hsTn negative x2. Morning BMET pending. -- planned for cath this afternoon as last dose of Xarelto was Monday evening. Currently on IV heparin.  -- The patient  understands that risks included but are not limited to stroke (1 in 1000), death (1 in 44), kidney failure [usually temporary] (1 in 500), bleeding (1 in 200), allergic reaction [possibly serious] (1 in 200).   2. PAF: has been treated with Xarelto and Flecainide (pill in pocket). Reports he can usual tell when he goes into afib. Took 2 doses over the weekend.  -- Xarelto held with plans for cath today.  -- back in Afib this morning but rate controlled. Will hold on giving Flecainide with plans for cath. May to consider being on daily therapy instead of PRN with increased use of Flecainide recently? Further recs post cath.   3. HTN: stable on current therapy  4. HL: on statin  5. DM: on SSI while inpatient.   For questions or updates, please contact [place] Please consult [email or web address] for contact info under        Signed, Reino Bellis, NP  04/18/2019, 9:00 AM

## 2019-04-18 NOTE — Progress Notes (Signed)
Progress Note  Patient Name: Carlos Gutierrez Date of Encounter: 04/18/2019  Primary Cardiologist: [name], MD   Subjective   Mild episode of chest pain this morning. Converted to Afib around 1:30am.   Inpatient Medications    Scheduled Meds: . aspirin  81 mg Oral Pre-Cath  . aspirin EC  81 mg Oral Daily  . atorvastatin  40 mg Oral Daily  . glipiZIDE  2.5 mg Oral Q breakfast  . hydrochlorothiazide  12.5 mg Oral Daily  . insulin aspart  0-15 Units Subcutaneous TID WC  . irbesartan  150 mg Oral Daily  . loratadine  10 mg Oral Daily  . metoprolol tartrate  12.5 mg Oral BID  . multivitamin with minerals  1 tablet Oral Daily  . pantoprazole  40 mg Oral BID  . sodium chloride flush  3 mL Intravenous Q12H   Continuous Infusions: . sodium chloride    . sodium chloride 1 mL/kg/hr (04/18/19 0011)  . heparin 1,400 Units/hr (04/18/19 0400)   PRN Meds: sodium chloride, acetaminophen, flecainide, Melatonin, nitroGLYCERIN, sodium chloride flush   Vital Signs    Vitals:   04/17/19 1458 04/17/19 1500 04/18/19 0612 04/18/19 0817  BP: 113/78  108/80 117/77  Pulse: 76  (!) 55   Resp:   18   Temp: 98.6 F (37 C)  98.6 F (37 C)   TempSrc: Oral  Oral   SpO2: 99%  100%   Weight:  104.4 kg 104 kg   Height:  6\' 1"  (1.854 m)      Intake/Output Summary (Last 24 hours) at 04/18/2019 0900 Last data filed at 04/18/2019 0800 Gross per 24 hour  Intake 180.28 ml  Output 700 ml  Net -519.72 ml   Last 3 Weights 04/18/2019 04/17/2019 04/17/2019  Weight (lbs) 229 lb 4.5 oz 230 lb 3.2 oz 232 lb  Weight (kg) 104 kg 104.418 kg 105.235 kg      Telemetry    SR--> Afib rate controlled around 1:30am - Personally Reviewed  ECG    No recent tracing  Physical Exam  Pleasant older WM, sitting up in bed GEN: No acute distress.   Neck: No JVD Cardiac: Irreg Irreg, no murmurs, rubs, or gallops.  Respiratory: Clear to auscultation bilaterally. GI: Soft, nontender, non-distended  MS: No  edema; No deformity. Neuro:  Nonfocal  Psych: Normal affect   Labs    High Sensitivity Troponin:   Recent Labs  Lab 04/17/19 1634 04/17/19 1831  TROPONINIHS 6 7      Chemistry Recent Labs  Lab 04/17/19 1634  NA 139  K 3.8  CL 103  CO2 26  GLUCOSE 103*  BUN 19  CREATININE 1.36*  CALCIUM 9.1  PROT 6.9  ALBUMIN 4.0  AST 44*  ALT 60*  ALKPHOS 82  BILITOT 1.7*  GFRNONAA 53*  GFRAA >60  ANIONGAP 10     Hematology Recent Labs  Lab 04/17/19 1634 04/18/19 0427  WBC 3.7* 5.2  RBC 5.04 4.92  HGB 15.4 14.9  HCT 45.7 44.4  MCV 90.7 90.2  MCH 30.6 30.3  MCHC 33.7 33.6  RDW 14.6 14.5  PLT 124* 113*    BNP Recent Labs  Lab 04/17/19 1626  BNP 46.2     DDimer No results for input(s): DDIMER in the last 168 hours.   Radiology    X-ray chest PA and lateral  Result Date: 04/17/2019 CLINICAL DATA:  69 year old male with shortness of breath. EXAM: CHEST - 2 VIEW COMPARISON:  Chest radiograph dated 09/21/2016. FINDINGS: Minimal bibasilar linear atelectasis/scarring. No focal consolidation, pleural effusion, pneumothorax. The cardiac silhouette is within normal limits. No acute osseous pathology. IMPRESSION: No active cardiopulmonary disease. Electronically Signed   By: Anner Crete M.D.   On: 04/17/2019 15:40    Cardiac Studies   TTE: pending read  Patient Profile     69 y.o. male with PMH of Aflutter s/p ablation, nonobstructive CAD, HTN, HL, OSA, Hx of DVT, DM who presented to the office with chest pain. Sent for direct admission with plans for cardiac cath.   Assessment & Plan    1. Unstable Angina/dyspnea: presented to the office yesterday with symptoms and sent for direct admission with plans for cardiac cath. Brief episode of chest pain early this morning, may have been his conversion to Afib? hsTn negative x2. Morning BMET pending. -- planned for cath this afternoon as last dose of Xarelto was Monday evening. Currently on IV heparin.  -- The patient  understands that risks included but are not limited to stroke (1 in 1000), death (1 in 20), kidney failure [usually temporary] (1 in 500), bleeding (1 in 200), allergic reaction [possibly serious] (1 in 200).   2. PAF: has been treated with Xarelto and Flecainide (pill in pocket). Reports he can usual tell when he goes into afib. Took 2 doses over the weekend.  -- Xarelto held with plans for cath today.  -- back in Afib this morning but rate controlled. Will hold on giving Flecainide with plans for cath. May to consider being on daily therapy instead of PRN with increased use of Flecainide recently? Further recs post cath.   3. HTN: stable on current therapy  4. HL: on statin  5. DM: on SSI while inpatient.   For questions or updates, please contact [place] Please consult [email or web address] for contact info under        Signed, Reino Bellis, NP  04/18/2019, 9:00 AM

## 2019-04-18 NOTE — Progress Notes (Signed)
Echocardiogram 2D Echocardiogram has been performed.  Oneal Deputy Frona Yost 04/18/2019, 9:21 AM

## 2019-04-18 NOTE — Discharge Instructions (Addendum)
PRIOR TO STARTING ANY NEW MEDICINE PRESCRIBED OR OVER THE COUNTER, PLEASE CHECK WITH YOUR PHARMACIST IF OK TO TAKE WITH TIKOSYN (DOFETILIDE)  YOU CAN ALSO REFERENCE WEB SITE [email or web address]    You have an appointment set up with the [facility].  Multiple studies have shown that being followed by a dedicated atrial fibrillation clinic in addition to the standard care you receive from your other physicians improves health. We believe that enrollment in the atrial fibrillation clinic will allow [place] to better care for you.   The phone number to the [facility] is [phone]. The clinic is staffed Monday through Friday from 8:30am to 5pm.  Parking Directions: The clinic is located in the Heart and Vascular Building connected to [facility]. 1)From [address] turn on to [address] and go to the 3rd entrance  (Heart and Vascular entrance) on the right. 2)Look to the right for Heart &Vascular Parking Garage. 3)A code for the entrance is required please call the clinic to receive this.   4)Take the elevators to the 1st floor. Registration is in the room with the glass walls at the end of the hallway.  If you have any trouble parking or locating the clinic, please don't hesitate to call [phone].      Information on my medicine - XARELTO (Rivaroxaban) Why was Xarelto prescribed for you? Xarelto was prescribed for you to reduce the risk of a blood clot forming that can cause a stroke if you have a medical condition called atrial fibrillation (a type of irregular heartbeat).  What do you need to know about xarelto ? Take your Xarelto ONCE DAILY at the same time every day with your evening meal. If you have difficulty swallowing the tablet whole, you may crush it and mix in applesauce just prior to taking your dose.  Take Xarelto exactly as prescribed by your doctor and DO NOT stop taking Xarelto without talking to the doctor who prescribed  the medication.  Stopping without other stroke prevention medication to take the place of Xarelto may increase your risk of developing a clot that causes a stroke.  Refill your prescription before you run out.  After discharge, you should have regular check-up appointments with your healthcare provider that is prescribing your Xarelto.  In the future your dose may need to be changed if your kidney function or weight changes by a significant amount.  What do you do if you miss a dose? If you are taking Xarelto ONCE DAILY and you miss a dose, take it as soon as you remember on the same day then continue your regularly scheduled once daily regimen the next day. Do not take two doses of Xarelto at the same time or on the same day.   Important Safety Information A possible side effect of Xarelto is bleeding. You should call your healthcare provider right away if you experience any of the following: ? Bleeding from an injury or your nose that does not stop. ? Unusual colored urine (red or dark brown) or unusual colored stools (red or black). ? Unusual bruising for unknown reasons. ? A serious fall or if you hit your head (even if there is no bleeding).  Some medicines may interact with Xarelto and might increase your risk of bleeding while on Xarelto. To help avoid this, consult your healthcare provider or pharmacist prior to using any new prescription or non-prescription medications, including herbals, vitamins, non-steroidal anti-inflammatory drugs (NSAIDs) and supplements.  This website has more  information on Xarelto: [email or web address]

## 2019-04-18 NOTE — CV Procedure (Signed)
   Diagnostic coronary angiography via right radial using real-time vascular ultrasound for access.  Mild to moderate diffuse coronary plaquing involving the left coronary system.  Moderate to moderately severe diffuse right coronary disease with up to 70% segmental narrowing distally, unchanged from 2018.  Normal sinus rhythm with normal LVEDP and estimated ejection fraction 55 to 60%.

## 2019-04-19 ENCOUNTER — Encounter (HOSPITAL_COMMUNITY): Payer: Self-pay | Admitting: Interventional Cardiology

## 2019-04-19 ENCOUNTER — Other Ambulatory Visit: Payer: Self-pay

## 2019-04-19 ENCOUNTER — Telehealth: Payer: Self-pay | Admitting: Interventional Cardiology

## 2019-04-19 DIAGNOSIS — Z20822 Contact with and (suspected) exposure to covid-19: Secondary | ICD-10-CM | POA: Diagnosis present

## 2019-04-19 DIAGNOSIS — Z800 Family history of malignant neoplasm of digestive organs: Secondary | ICD-10-CM | POA: Diagnosis not present

## 2019-04-19 DIAGNOSIS — Z7982 Long term (current) use of aspirin: Secondary | ICD-10-CM | POA: Diagnosis not present

## 2019-04-19 DIAGNOSIS — I2584 Coronary atherosclerosis due to calcified coronary lesion: Secondary | ICD-10-CM | POA: Diagnosis present

## 2019-04-19 DIAGNOSIS — Z833 Family history of diabetes mellitus: Secondary | ICD-10-CM | POA: Diagnosis not present

## 2019-04-19 DIAGNOSIS — K7030 Alcoholic cirrhosis of liver without ascites: Secondary | ICD-10-CM | POA: Diagnosis not present

## 2019-04-19 DIAGNOSIS — I25110 Atherosclerotic heart disease of native coronary artery with unstable angina pectoris: Secondary | ICD-10-CM | POA: Diagnosis present

## 2019-04-19 DIAGNOSIS — R042 Hemoptysis: Secondary | ICD-10-CM | POA: Diagnosis present

## 2019-04-19 DIAGNOSIS — E1165 Type 2 diabetes mellitus with hyperglycemia: Secondary | ICD-10-CM | POA: Diagnosis not present

## 2019-04-19 DIAGNOSIS — E785 Hyperlipidemia, unspecified: Secondary | ICD-10-CM | POA: Diagnosis present

## 2019-04-19 DIAGNOSIS — Z7984 Long term (current) use of oral hypoglycemic drugs: Secondary | ICD-10-CM | POA: Diagnosis not present

## 2019-04-19 DIAGNOSIS — K5732 Diverticulitis of large intestine without perforation or abscess without bleeding: Secondary | ICD-10-CM | POA: Diagnosis present

## 2019-04-19 DIAGNOSIS — Z79899 Other long term (current) drug therapy: Secondary | ICD-10-CM | POA: Diagnosis not present

## 2019-04-19 DIAGNOSIS — E669 Obesity, unspecified: Secondary | ICD-10-CM | POA: Diagnosis present

## 2019-04-19 DIAGNOSIS — I4892 Unspecified atrial flutter: Secondary | ICD-10-CM | POA: Diagnosis present

## 2019-04-19 DIAGNOSIS — K5792 Diverticulitis of intestine, part unspecified, without perforation or abscess without bleeding: Secondary | ICD-10-CM | POA: Diagnosis not present

## 2019-04-19 DIAGNOSIS — Z86718 Personal history of other venous thrombosis and embolism: Secondary | ICD-10-CM | POA: Diagnosis not present

## 2019-04-19 DIAGNOSIS — I10 Essential (primary) hypertension: Secondary | ICD-10-CM | POA: Diagnosis present

## 2019-04-19 DIAGNOSIS — Z6830 Body mass index (BMI) 30.0-30.9, adult: Secondary | ICD-10-CM | POA: Diagnosis not present

## 2019-04-19 DIAGNOSIS — Z825 Family history of asthma and other chronic lower respiratory diseases: Secondary | ICD-10-CM | POA: Diagnosis not present

## 2019-04-19 DIAGNOSIS — Z7901 Long term (current) use of anticoagulants: Secondary | ICD-10-CM | POA: Diagnosis not present

## 2019-04-19 DIAGNOSIS — Z888 Allergy status to other drugs, medicaments and biological substances status: Secondary | ICD-10-CM | POA: Diagnosis not present

## 2019-04-19 DIAGNOSIS — G4733 Obstructive sleep apnea (adult) (pediatric): Secondary | ICD-10-CM | POA: Diagnosis present

## 2019-04-19 DIAGNOSIS — I480 Paroxysmal atrial fibrillation: Secondary | ICD-10-CM | POA: Diagnosis present

## 2019-04-19 DIAGNOSIS — Z8249 Family history of ischemic heart disease and other diseases of the circulatory system: Secondary | ICD-10-CM | POA: Diagnosis not present

## 2019-04-19 DIAGNOSIS — K7460 Unspecified cirrhosis of liver: Secondary | ICD-10-CM | POA: Diagnosis present

## 2019-04-19 LAB — GLUCOSE, CAPILLARY
Glucose-Capillary: 120 mg/dL — ABNORMAL HIGH (ref 70–99)
Glucose-Capillary: 154 mg/dL — ABNORMAL HIGH (ref 70–99)
Glucose-Capillary: 130 mg/dL — ABNORMAL HIGH (ref 70–99)
Glucose-Capillary: 107 mg/dL — ABNORMAL HIGH (ref 70–99)

## 2019-04-19 LAB — BASIC METABOLIC PANEL
Anion gap: 9 (ref 5–15)
BUN: 16 mg/dL (ref 8–23)
CO2: 21 mmol/L — ABNORMAL LOW (ref 22–32)
Calcium: 8.7 mg/dL — ABNORMAL LOW (ref 8.9–10.3)
Chloride: 108 mmol/L (ref 98–111)
Creatinine, Ser: 1.31 mg/dL — ABNORMAL HIGH (ref 0.61–1.24)
GFR calc Af Amer: >60 mL/min (ref >60)
GFR calc non Af Amer: 56 mL/min — ABNORMAL LOW (ref >60)
Glucose, Bld: 138 mg/dL — ABNORMAL HIGH (ref 70–99)
Potassium: 4.1 mmol/L (ref 3.5–5.1)
Sodium: 138 mmol/L (ref 135–145)

## 2019-04-19 LAB — CBC
HCT: 45.8 % (ref 39.0–52.0)
Hemoglobin: 15.3 g/dL (ref 13.0–17.0)
MCH: 30.2 pg (ref 26.0–34.0)
MCHC: 33.4 g/dL (ref 30.0–36.0)
MCV: 90.3 fL (ref 80.0–100.0)
Platelets: 125 10*3/uL — ABNORMAL LOW (ref 150–400)
RBC: 5.07 MIL/uL (ref 4.22–5.81)
RDW: 14.6 % (ref 11.5–15.5)
WBC: 4.6 10*3/uL (ref 4.0–10.5)
nRBC: 0 % (ref 0.0–0.2)

## 2019-04-19 LAB — MAGNESIUM
Magnesium: 2.1 mg/dL (ref 1.7–2.4)
Magnesium: 2.2 mg/dL (ref 1.7–2.4)

## 2019-04-19 MED ORDER — SODIUM CHLORIDE 0.9% FLUSH: 3.0000 mL | INTRAVENOUS | Status: DC | PRN

## 2019-04-19 MED ORDER — SODIUM CHLORIDE 0.9 % IV SOLN: 250.0000 mL | INTRAVENOUS | Status: DC | PRN

## 2019-04-19 MED ORDER — DOFETILIDE 500 MCG PO CAPS: 500.0000 ug | ORAL_CAPSULE | Freq: Two times a day (BID) | ORAL | Status: DC

## 2019-04-19 MED ORDER — DOFETILIDE 500 MCG PO CAPS
500.0000 ug | ORAL_CAPSULE | Freq: Two times a day (BID) | ORAL | Status: DC
  Administered 2019-04-19 – 2019-04-22 (×6): 500 ug via ORAL
  Filled 2019-04-19 (×6): qty 1

## 2019-04-19 MED ORDER — SODIUM CHLORIDE 0.9% FLUSH
3.0000 mL | Freq: Two times a day (BID) | INTRAVENOUS | Status: DC
  Administered 2019-04-19 – 2019-04-22 (×7): 3 mL via INTRAVENOUS

## 2019-04-19 MED ORDER — RIVAROXABAN 20 MG PO TABS
20.0000 mg | ORAL_TABLET | Freq: Every day | ORAL | Status: DC
  Administered 2019-04-19 – 2019-04-21 (×3): 20 mg via ORAL
  Filled 2019-04-19 (×3): qty 1

## 2019-04-19 NOTE — Plan of Care (Signed)
Education verbal and printed given to pt regarding medication xarelto, verbalized understanding of medication uses, side affects , also educated on symptoms to report to nurse or health care provider.

## 2019-04-19 NOTE — Care Management (Signed)
Possible discharge this "Sunday 04/22/19 on Tikosyn.  Called patient's pharmacy [facility] [phone], they have all doses in generic. Chatted PA prescription will be for generic. Entered benefit check for following :     Dofetilide     500mcg BID,  x 30 days      Dofetilide      250mcg BID,     x 30 days   Dofetilide      12" 67mcg BID x 30 days    Magdalen Spatz RN

## 2019-04-19 NOTE — Telephone Encounter (Signed)
Spoke with [name] and she was needing the ok from Dr. Tamala Julian to flip pt to inpatient since they are doing Tikosyn initiation.  Spoke with Dr. Tamala Julian and he said that was fine.  [name] appreciative for assistance.

## 2019-04-19 NOTE — Progress Notes (Signed)
Patient has been having multiple pauses through the night (longest pause recorded at 2.45 seconds), patient appears to be asymptomatic upon assessment.  At one point RN was taking him to the restroom when pauses occurred. Will continue to monitor.   Elaina Hoops, RN

## 2019-04-19 NOTE — TOC Benefit Eligibility Note (Signed)
Transition of Care [facility]) Benefit Eligibility Note    Patient Details  Name: RASHEEM MONEGRO MRN: [id number] Date of Birth: [birth year]   Medication/Dose: Dofetilide  500,250, and 125 mcg Bid  Covered?: Yes  Tier: Other(Not available)  Prescription Coverage [facility]erred [facility]acy: [facility], or [facility]  Spoke with Person/Company/Phone Number:: Renee? [facility]/ [phone]  Co-Pay: 15.00 for a 30 day Supply 500,250 and 125 mcgs BID  Prior Approval: No  Deductible: Unmet  Additional Notes: Tikosyn Not covered    Orbie Pyo Phone Number: 04/19/2019, 12:15 PM

## 2019-04-19 NOTE — TOC Initial Note (Signed)
Transition of Care [facility]) - Initial/Assessment Note    Patient Details  Name: Carlos Gutierrez MRN: [id number] Date of Birth: [birth year]  Transition of Care [facility]) CM/SW Contact:    Marilu Favre, RN Phone Number: 04/19/2019, 2:09 PM  Clinical Narrative:                 Patient from home. Possible discharge Sunday on  Dofetilide  , benefits check came back $15 co pay, patient's pharmacy [facility] has all doses in stock.   Patient aware and voiced understanding.  Expected Discharge Plan: Home/Self Care Barriers to Discharge: Continued Medical Work up   Patient Goals and CMS Choice Patient states their goals for this hospitalization and ongoing recovery are:: to return to home CMS Medicare.gov Compare Post Acute Care list provided to:: Patient Choice offered to / list presented to : NA  Expected Discharge Plan and Services Expected Discharge Plan: Home/Self Care   Discharge Planning Services: CM Consult, Medication Assistance Post Acute Care Choice: NA Living arrangements for the past 2 months: Single Family Home                 DME Arranged: N/A         HH Arranged: NA          Prior Living Arrangements/Services Living arrangements for the past 2 months: Single Family Home Lives with:: Spouse Patient language and need for interpreter reviewed:: Yes Do you feel safe going back to the place where you live?: Yes      Need for Family Participation in Patient Care: No (Comment) Care giver support system in place?: Yes (comment)   Criminal Activity/Legal Involvement Pertinent to Current Situation/Hospitalization: No - Comment as needed  Activities of Daily Living      Permission Sought/Granted   Permission granted to share information with : No              Emotional Assessment   Attitude/Demeanor/Rapport: Engaged Affect (typically observed): Accepting Orientation: : Oriented to Self, Oriented to Place, Oriented to  Time, Oriented to Situation Alcohol  / Substance Use: Not Applicable Psych Involvement: No (comment)  Admission diagnosis:  Coronary artery disease involving native coronary artery of native heart with unstable angina pectoris ([place]) [I25.110] Patient Active Problem List   Diagnosis Date Noted  . Coronary artery disease involving native coronary artery of native heart with unstable angina pectoris ([place]) 04/17/2019  . Type 2 diabetes mellitus without complication, without long-term current use of insulin ([place]) 04/17/2019  . Abnormal nuclear cardiac imaging test 02/10/2017  . Chest pain 09/21/2016  . Pulmonary embolus ([place])   . Umbilical hernia [id number]  . Atrial flutter ([place]) 09/28/2013  . Encounter for long-term (current) use of other medications 09/28/2013  . Insomnia 04/13/2013  . Obstructive sleep apnea on CPAP   . HTN (hypertension)   . Obesity (BMI 30-39.9)   . Atrial fibrillation ([place]) 01/21/2011   PCP:  London Pepper, MD Pharmacy:   [place], [place] [address] Phone: [phone] Fax: [phone]  [facility] [place], [place] [address] [place] [address] Phone: [phone] Fax: [phone]     Social Determinants of Health (SDOH) Interventions    Readmission Risk Interventions No flowsheet data found.

## 2019-04-19 NOTE — Progress Notes (Signed)
Pharmacy: Dofetilide (Tikosyn) - Initial Consult Assessment and Electrolyte Replacement  Pharmacy consulted to assist in monitoring and replacing electrolytes in this 69 y.o. male admitted on 04/17/2019 undergoing dofetilide initiation. First dofetilide dose: 04/19/19 pm  Assessment:  Patient Exclusion Criteria: If any screening criteria checked as "Yes", then  patient  should NOT receive dofetilide until criteria item is corrected.  If "Yes" please indicate correction plan.  YES  NO Patient  Exclusion Criteria Correction Plan   []   [x]   Baseline QTc interval is greater than or equal to 440 msec. IF above YES box checked dofetilide contraindicated unless patient has ICD; then may proceed if QTc 500-550 msec or with known ventricular conduction abnormalities may proceed with QTc 550-600 msec. QTc = 0.38    []   [x]   Patient is known or suspected to have a digoxin level greater than 2 ng/ml: No results found for: DIGOXIN     []   [x]   Creatinine clearance less than 20 ml/min (calculated using Cockcroft-Gault, actual body weight and serum creatinine): Estimated Creatinine Clearance: 69.3 mL/min (A) (by C-G formula based on SCr of 1.31 mg/dL (H)).     []   [x]  Patient has received drugs known to prolong the QT intervals within the last 48 hours (phenothiazines, tricyclics or tetracyclic antidepressants, erythromycin, H-1 antihistamines, cisapride, fluoroquinolones, azithromycin). Updated information on QT prolonging agents is available to be searched on the following database:QT prolonging agents     []   [x]   Patient received a dose of hydrochlorothiazide (Oretic) alone or in any combination including triamterene (Dyazide, Maxzide) in the last 48 hours. Finishes washout today   []   [x]  Patient received a medication known to increase dofetilide plasma concentrations prior to initial dofetilide dose:  . Trimethoprim (Primsol, Proloprim) in the last 36 hours . Verapamil (Calan, Verelan) in  the last 36 hours or a sustained release dose in the last 72 hours . Megestrol (Megace) in the last 5 days  . Cimetidine (Tagamet) in the last 6 hours . Ketoconazole (Nizoral) in the last 24 hours . Itraconazole (Sporanox) in the last 48 hours  . Prochlorperazine (Compazine) in the last 36 hours     []   [x]   Patient is known to have a history of torsades de pointes; congenital or acquired long QT syndromes.    []   [x]   Patient has received a Class 1 antiarrhythmic with less than 2 half-lives since last dose. (Disopyramide, Quinidine, Procainamide, Lidocaine, Mexiletine, Flecainide, Propafenone)    []   [x]   Patient has received amiodarone therapy in the past 3 months or amiodarone level is greater than 0.3 ng/ml.    Patient has been appropriately anticoagulated with xarelto.  Labs:    Component Value Date/Time   K 4.1 04/19/2019 0304   MG 2.1 04/19/2019 0834     Plan: Potassium: K >/= 4: Appropriate to initiate Tikosyn, no replacement needed    Magnesium: Mg >2: Appropriate to initiate Tikosyn, no replacement needed     Thank you for allowing pharmacy to participate in this patient's care   Erin Hearing PharmD., BCPS Clinical Pharmacist 04/19/2019 10:05 AM

## 2019-04-19 NOTE — Telephone Encounter (Signed)
Jeanie from [place] is calling because patient is currently admitted in the hospital and is under observation but is going to be moved to In Patient. She works until 7 and is asking for a call back from either Dr. Tamala Julian or his nurse.

## 2019-04-19 NOTE — Progress Notes (Addendum)
Progress Note  Patient Name: Carlos Gutierrez Date of Encounter: 04/19/2019  Primary Cardiologist: Sinclair Grooms, MD   Subjective   Feels OK, no CP or SOB, would like to start Tikosyn as first next option for rhythm control  Inpatient Medications    Scheduled Meds:  atorvastatin  80 mg Oral q1800   glipiZIDE  2.5 mg Oral Q breakfast   insulin aspart  0-15 Units Subcutaneous TID WC   irbesartan  150 mg Oral Daily   loratadine  10 mg Oral Daily   multivitamin with minerals  1 tablet Oral Daily   rivaroxaban  20 mg Oral Daily   sodium chloride flush  3 mL Intravenous Q12H   Continuous Infusions:  sodium chloride     PRN Meds: sodium chloride, acetaminophen, Melatonin, nitroGLYCERIN, ondansetron (ZOFRAN) IV, oxyCODONE, sodium chloride flush   Vital Signs    Vitals:   04/18/19 1350 04/18/19 1405 04/18/19 1440 04/19/19 0556  BP: 97/66 100/68 94/67 118/71  Pulse: (!) 47 (!) 56 (!) 50 (!) 55  Resp:    18  Temp:   98.7 F (37.1 C) 97.7 F (36.5 C)  TempSrc:   Oral Oral  SpO2: 96% 99% 98% 100%  Weight:    103.8 kg  Height:        Intake/Output Summary (Last 24 hours) at 04/19/2019 0834 Last data filed at 04/19/2019 0256 Gross per 24 hour  Intake 640 ml  Output 320 ml  Net 320 ml   Last 3 Weights 04/19/2019 04/18/2019 04/17/2019  Weight (lbs) 228 lb 13.4 oz 229 lb 4.5 oz 230 lb 3.2 oz  Weight (kg) 103.8 kg 104 kg 104.418 kg      Telemetry    AFib generally 60's, 50s overnight - Personally Reviewed  ECG    AFib 63bpm, manually measured QT 431ms, QTc 425ms - Personally Reviewed  Physical Exam   GEN: No acute distress.   Neck: No JVD Cardiac: irreg-irreg, no murmurs, rubs, or gallops.  Respiratory: CTA b/l. GI: Soft, nontender, non-distended  MS: No edema; No deformity. Neuro:  Nonfocal  Psych: Normal affect   Labs    High Sensitivity Troponin:   Recent Labs  Lab 04/17/19 1634 04/17/19 1831  TROPONINIHS 6 7      Chemistry Recent Labs  Lab 04/17/19  1634 04/18/19 0804 04/19/19 0304  NA 139 140 138  K 3.8 4.2 4.1  CL 103 101 108  CO2 26 24 21*  GLUCOSE 103* 137* 138*  BUN 19 17 16   CREATININE 1.36* 1.26* 1.31*  CALCIUM 9.1 9.1 8.7*  PROT 6.9  --   --   ALBUMIN 4.0  --   --   AST 44*  --   --   ALT 60*  --   --   ALKPHOS 82  --   --   BILITOT 1.7*  --   --   GFRNONAA 53* 59* 56*  GFRAA >60 >60 >60  ANIONGAP 10 15 9      Hematology Recent Labs  Lab 04/17/19 1634 04/18/19 0427 04/19/19 0304  WBC 3.7* 5.2 4.6  RBC 5.04 4.92 5.07  HGB 15.4 14.9 15.3  HCT 45.7 44.4 45.8  MCV 90.7 90.2 90.3  MCH 30.6 30.3 30.2  MCHC 33.7 33.6 33.4  RDW 14.6 14.5 14.6  PLT 124* 113* 125*    BNP Recent Labs  Lab 04/17/19 1626  BNP 46.2     DDimer No results for input(s): DDIMER in the last 168  hours.   Radiology       Cardiac Studies   04/18/2019: TTE IMPRESSIONS  1. Left ventricular ejection fraction, by visual estimation, is 50 to  55%. The left ventricle has normal function. There is no left ventricular  hypertrophy.   2. Definity contrast agent was given IV to delineate the left ventricular  endocardial borders.   3. The left ventricle has no regional wall motion abnormalities.   4. Left ventricular diastolic parameters are indeterminate.   5. Global right ventricle has normal systolic function.The right  ventricular size is not well visualized. Right vetricular wall thickness  was not assessed.   6. Left atrial size was normal.   7. Right atrial size was normal.   8. The mitral valve is normal in structure. No evidence of mitral valve  regurgitation. No evidence of mitral stenosis.   9. The tricuspid valve is normal in structure. Tricuspid valve  regurgitation is trivial.  10. The aortic valve is grossly normal. Aortic valve regurgitation is not  visualized. No evidence of aortic valve sclerosis or stenosis.  11. The pulmonic valve was normal in structure. Pulmonic valve  regurgitation is trivial.  12. The  inferior vena cava is normal in size with greater than 50%  respiratory variability, suggesting right atrial pressure of 3 mmHg.  13. TR signal is inadequate for assessing pulmonary artery systolic  pressure.      04/18/2019: LHC 30 to 40% distal left main, which is new/progressed from 2018. Luminal irregularities in the LAD mid and distal. First obtuse marginal 50% tandem proximal to mid stenoses. 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  Unchanged compared to prior images. Normal left ventricular function with EF 55%.  LVEDP normal. Atrial fibrillation developed during the night.  He is asymptomatic and did not know he was in atrial fib.   Recommendations: Continue aggressive risk factor modification.  Increase statin intensity to max dose atorvastatin, 80 mg/day. Consider adding antiarrhythmic therapy continuously to control atrial for rhythm.  Perhaps some of his symptoms are related to undetected atrial fibrillation.    Patient Profile     69 y.o. male with a hx of CAD (non-obstructive historically), HTN, HLD, DM, AFlutter (ablated 2003), and AFib, DVT, OSA w/CPAP  Admitted to [facility] for CP, DOE, found to have NOD by cath, preserved LVEF, and while her converted to rate controlled AFib  Assessment & Plan    1. Paroxysmal AFib     CHA2DS2Vasc is 5, on Xarelto, appropriately dosed     To now managed with pill in the pocket approach with Flecainide.  He has had some progression/new CAD, non-obstructive however.  Including 50-70% RCA lesion (this similar to 2018), new/progressed LM disease 30-40% Given his cath, flecainide no longer felt to be ideal AAD for him  After discussion with the patient for Tikosyn vs EPS/ablation as next step for rhythm control, he has opted for Tikosyn initiation I and Dr. Lovena Le discussed with him yesterday and re-visited today potential risks and beneefots of Tikosyn, as well as initiation  process, hospital stay, medications limitations  His last dose of HCT was 04/17/2019 morning prior to coming in We will stat Tikosyn tonight  Calc CrCL is 80 Baseline QTc is acceptable lytes look OK, mag yesterday 2.1, recheck today is pending  Plan to start 52mcg BID tonight   1. HTN     No changes today  3. DM  On home glipizide and SSI  For questions or updates, please contact [place] Please consult [email or web address] for contact info under     Signed, Baldwin Jamaica, PA-C  04/19/2019, 8:34 AM    EP Attending  Patient seen and examined. Agree with the findings as noted above. The patient has been stable overnight. He wishes to start dofetilide and we will initiate tonight 48 hours after last dose of HCTZ. He will be monitor in the hospital with initiation over the next 3.5 days due to symptomatic atrial fib.  Mikle Bosworth.D.

## 2019-04-20 ENCOUNTER — Other Ambulatory Visit: Payer: Self-pay

## 2019-04-20 LAB — BASIC METABOLIC PANEL
Anion gap: 8 (ref 5–15)
BUN: 18 mg/dL (ref 8–23)
CO2: 24 mmol/L (ref 22–32)
Calcium: 8.8 mg/dL — ABNORMAL LOW (ref 8.9–10.3)
Chloride: 105 mmol/L (ref 98–111)
Creatinine, Ser: 1.3 mg/dL — ABNORMAL HIGH (ref 0.61–1.24)
GFR calc Af Amer: >60 mL/min (ref >60)
GFR calc non Af Amer: 56 mL/min — ABNORMAL LOW (ref >60)
Glucose, Bld: 122 mg/dL — ABNORMAL HIGH (ref 70–99)
Potassium: 4.4 mmol/L (ref 3.5–5.1)
Sodium: 137 mmol/L (ref 135–145)

## 2019-04-20 LAB — MAGNESIUM: Magnesium: 2.3 mg/dL (ref 1.7–2.4)

## 2019-04-20 LAB — GLUCOSE, CAPILLARY
Glucose-Capillary: 164 mg/dL — ABNORMAL HIGH (ref 70–99)
Glucose-Capillary: 109 mg/dL — ABNORMAL HIGH (ref 70–99)
Glucose-Capillary: 75 mg/dL (ref 70–99)
Glucose-Capillary: 159 mg/dL — ABNORMAL HIGH (ref 70–99)

## 2019-04-20 NOTE — Progress Notes (Signed)
Pharmacy: Dofetilide (Tikosyn) - Follow Up Assessment and Electrolyte Replacement  Pharmacy consulted to assist in monitoring and replacing electrolytes in this 69 y.o. male admitted on 04/17/2019 undergoing dofetilide initiation. First dofetilide dose: 04/19/19  Labs:    Component Value Date/Time   K 4.4 04/20/2019 0327   MG 2.3 04/20/2019 0327     Plan: Potassium: K >/= 4: No additional supplementation needed  Magnesium: Mg > 2: No additional supplementation needed   Patient has not required potassium this admit, will continue to follow for supplements.   Noted patient's [place] has medication, open from 1-6p on Sundays  Thank you for allowing pharmacy to participate in this patient's care   Erin Hearing PharmD., BCPS Clinical Pharmacist 04/20/2019 8:02 AM

## 2019-04-20 NOTE — Plan of Care (Signed)
  Problem: Education: Goal: Knowledge of General Education information will improve Description: Including pain rating scale, medication(s)/side effects and non-pharmacologic comfort measures Outcome: Progressing   Problem: Health Behavior/Discharge Planning: Goal: Ability to manage health-related needs will improve Outcome: Progressing   Problem: Clinical Measurements: Goal: Cardiovascular complication will be avoided Outcome: Progressing   Problem: Education: Goal: Knowledge of disease or condition will improve Outcome: Progressing Goal: Understanding of medication regimen will improve Outcome: Progressing

## 2019-04-20 NOTE — Progress Notes (Addendum)
Progress Note  Patient Name: Carlos Gutierrez Date of Encounter: 04/20/2019  Primary Cardiologist: Sinclair Grooms, MD   Subjective   Feels OK, no CP or SOB, thinks he went in [place] prior to drug  Inpatient Medications    Scheduled Meds:  atorvastatin  80 mg Oral q1800   dofetilide  500 mcg Oral BID   glipiZIDE  2.5 mg Oral Q breakfast   insulin aspart  0-15 Units Subcutaneous TID WC   irbesartan  150 mg Oral Daily   loratadine  10 mg Oral Daily   multivitamin with minerals  1 tablet Oral Daily   rivaroxaban  20 mg Oral Q supper   sodium chloride flush  3 mL Intravenous Q12H   sodium chloride flush  3 mL Intravenous Q12H   Continuous Infusions:  sodium chloride     sodium chloride     PRN Meds: sodium chloride, sodium chloride, acetaminophen, Melatonin, nitroGLYCERIN, ondansetron (ZOFRAN) IV, oxyCODONE, sodium chloride flush, sodium chloride flush   Vital Signs    Vitals:   04/19/19 1035 04/19/19 1412 04/19/19 2009 04/20/19 0446  BP: 132/71 (!) 108/56 118/73 120/73  Pulse:  78 76 69  Resp:   19 18  Temp:  98.2 F (36.8 C) 98.7 F (37.1 C) 97.7 F (36.5 C)  TempSrc:  Oral Oral Oral  SpO2:  97% 99% 98%  Weight:      Height:        Intake/Output Summary (Last 24 hours) at 04/20/2019 0852 Last data filed at 04/20/2019 0630 Gross per 24 hour  Intake 496 ml  Output 1125 ml  Net -629 ml   Last 3 Weights 04/19/2019 04/18/2019 04/17/2019  Weight (lbs) 228 lb 13.4 oz 229 lb 4.5 oz 230 lb 3.2 oz  Weight (kg) 103.8 kg 104 kg 104.418 kg      Telemetry    AFib >> SR 60's - Personally Reviewed  ECG    SR 61bpm, QT 48ms, QTc 482ms - Personally Reviewed  Physical Exam   GEN: No acute distress.   Neck: No JVD Cardiac: RRR, no murmurs, rubs, or gallops.  Respiratory: CTA b/l. GI: Soft, nontender, non-distended  MS: No edema; No deformity. Neuro:  Nonfocal  Psych: Normal affect   Labs    High Sensitivity Troponin:   Recent Labs  Lab 04/17/19 1634  04/17/19 1831  TROPONINIHS 6 7      Chemistry Recent Labs  Lab 04/17/19 1634 04/17/19 1634 04/18/19 0804 04/19/19 0304 04/20/19 0327  NA 139   < > 140 138 137  K 3.8   < > 4.2 4.1 4.4  CL 103   < > 101 108 105  CO2 26   < > 24 21* 24  GLUCOSE 103*   < > 137* 138* 122*  BUN 19   < > 17 16 18   CREATININE 1.36*   < > 1.26* 1.31* 1.30*  CALCIUM 9.1   < > 9.1 8.7* 8.8*  PROT 6.9  --   --   --   --   ALBUMIN 4.0  --   --   --   --   AST 44*  --   --   --   --   ALT 60*  --   --   --   --   ALKPHOS 82  --   --   --   --   BILITOT 1.7*  --   --   --   --  GFRNONAA 53*   < > 59* 56* 56*  GFRAA >60   < > >60 >60 >60  ANIONGAP 10   < > 15 9 8    < > = values in this interval not displayed.     Hematology Recent Labs  Lab 04/17/19 1634 04/18/19 0427 04/19/19 0304  WBC 3.7* 5.2 4.6  RBC 5.04 4.92 5.07  HGB 15.4 14.9 15.3  HCT 45.7 44.4 45.8  MCV 90.7 90.2 90.3  MCH 30.6 30.3 30.2  MCHC 33.7 33.6 33.4  RDW 14.6 14.5 14.6  PLT 124* 113* 125*    BNP Recent Labs  Lab 04/17/19 1626  BNP 46.2     DDimer No results for input(s): DDIMER in the last 168 hours.   Radiology       Cardiac Studies   04/18/2019: TTE IMPRESSIONS  1. Left ventricular ejection fraction, by visual estimation, is 50 to  55%. The left ventricle has normal function. There is no left ventricular  hypertrophy.   2. Definity contrast agent was given IV to delineate the left ventricular  endocardial borders.   3. The left ventricle has no regional wall motion abnormalities.   4. Left ventricular diastolic parameters are indeterminate.   5. Global right ventricle has normal systolic function.The right  ventricular size is not well visualized. Right vetricular wall thickness  was not assessed.   6. Left atrial size was normal.   7. Right atrial size was normal.   8. The mitral valve is normal in structure. No evidence of mitral valve  regurgitation. No evidence of mitral stenosis.   9. The  tricuspid valve is normal in structure. Tricuspid valve  regurgitation is trivial.  10. The aortic valve is grossly normal. Aortic valve regurgitation is not  visualized. No evidence of aortic valve sclerosis or stenosis.  11. The pulmonic valve was normal in structure. Pulmonic valve  regurgitation is trivial.  12. The inferior vena cava is normal in size with greater than 50%  respiratory variability, suggesting right atrial pressure of 3 mmHg.  13. TR signal is inadequate for assessing pulmonary artery systolic  pressure.      04/18/2019: LHC 30 to 40% distal left main, which is new/progressed from 2018. Luminal irregularities in the LAD mid and distal. First obtuse marginal 50% tandem proximal to mid stenoses. 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  Unchanged compared to prior images. Normal left ventricular function with EF 55%.  LVEDP normal. Atrial fibrillation developed during the night.  He is asymptomatic and did not know he was in atrial fib.   Recommendations: Continue aggressive risk factor modification.  Increase statin intensity to max dose atorvastatin, 80 mg/day. Consider adding antiarrhythmic therapy continuously to control atrial for rhythm.  Perhaps some of his symptoms are related to undetected atrial fibrillation.    Patient Profile     69 y.o. male with a hx of CAD (non-obstructive historically), HTN, HLD, DM, AFlutter (ablated 2003), and AFib, DVT, OSA w/CPAP  Admitted to [facility] for CP, DOE, found to have NOD by cath, preserved LVEF, and while her converted to rate controlled AFib  Assessment & Plan    1. Paroxysmal AFib     CHA2DS2Vasc is 5, on Xarelto, appropriately dosed     previously managed with pill in the pocket approach with Flecainide.  He has had some progression/new CAD, non-obstructive however.  Including 50-70% RCA lesion (this similar to 2018), new/progressed LM  disease 30-40% Given  his cath, flecainide no longer felt to be ideal AAD for him  Tikosyn load in progress K+ 4.4 Mag 2.3 Creat 1.30 (stable) QT stable  Converted to [employer] teaching is completed with the patient, he is aware to discuss any new prescriptions or OTC medicines with his pharmacist to make sure is OK with his tikosyn  I have talked to the patient's pharmacy [facility], open weekends 1-6pm only) They have in stock dofetilide 500 and 212mcg strengths [facility] [address] [facility] 3 doses of dofetilide in stock Cost evaluation is completed, no prior auth is required  Tikosyn load follow up is in place  1. HTN     No changes today  3. DM     On home glipizide and SSI  For questions or updates, please contact [place] Please consult [email or web address] for contact info under     Signed, Baldwin Jamaica, PA-C  04/20/2019, 8:52 AM    EP Attending  Patient seen and examined. Agree with the findings as noted above. The patient has returned to NSR before starting dofetilide. The QT is ok. He can be discharged after his dose on Sunday morning. Usual followup. Continue 500 q12.   Mikle Bosworth.D.

## 2019-04-21 ENCOUNTER — Inpatient Hospital Stay (HOSPITAL_COMMUNITY): Payer: BC Managed Care – PPO

## 2019-04-21 ENCOUNTER — Other Ambulatory Visit: Payer: Self-pay

## 2019-04-21 DIAGNOSIS — R1032 Left lower quadrant pain: Secondary | ICD-10-CM

## 2019-04-21 LAB — CBC
HCT: 39.5 % (ref 39.0–52.0)
Hemoglobin: 13.4 g/dL (ref 13.0–17.0)
MCH: 30.2 pg (ref 26.0–34.0)
MCHC: 33.9 g/dL (ref 30.0–36.0)
MCV: 89.2 fL (ref 80.0–100.0)
Platelets: 101 10*3/uL — ABNORMAL LOW (ref 150–400)
RBC: 4.43 MIL/uL (ref 4.22–5.81)
RDW: 14.3 % (ref 11.5–15.5)
WBC: 4.2 10*3/uL (ref 4.0–10.5)
nRBC: 0 % (ref 0.0–0.2)

## 2019-04-21 LAB — BASIC METABOLIC PANEL
Anion gap: 8 (ref 5–15)
BUN: 17 mg/dL (ref 8–23)
CO2: 25 mmol/L (ref 22–32)
Calcium: 8.6 mg/dL — ABNORMAL LOW (ref 8.9–10.3)
Chloride: 103 mmol/L (ref 98–111)
Creatinine, Ser: 1.4 mg/dL — ABNORMAL HIGH (ref 0.61–1.24)
GFR calc Af Amer: 60 mL/min — ABNORMAL LOW (ref >60)
GFR calc non Af Amer: 52 mL/min — ABNORMAL LOW (ref >60)
Glucose, Bld: 129 mg/dL — ABNORMAL HIGH (ref 70–99)
Potassium: 3.9 mmol/L (ref 3.5–5.1)
Sodium: 136 mmol/L (ref 135–145)

## 2019-04-21 LAB — URINALYSIS, ROUTINE W REFLEX MICROSCOPIC
Bilirubin Urine: NEGATIVE
Glucose, UA: NEGATIVE mg/dL
Hgb urine dipstick: NEGATIVE
Ketones, ur: NEGATIVE mg/dL
Leukocytes,Ua: NEGATIVE
Nitrite: NEGATIVE
Protein, ur: NEGATIVE mg/dL
Specific Gravity, Urine: 1.009 (ref 1.005–1.030)
pH: 6 (ref 5.0–8.0)

## 2019-04-21 LAB — GLUCOSE, CAPILLARY
Glucose-Capillary: 113 mg/dL — ABNORMAL HIGH (ref 70–99)
Glucose-Capillary: 137 mg/dL — ABNORMAL HIGH (ref 70–99)
Glucose-Capillary: 130 mg/dL — ABNORMAL HIGH (ref 70–99)
Glucose-Capillary: 86 mg/dL (ref 70–99)

## 2019-04-21 LAB — PROCALCITONIN: Procalcitonin: 0.25 ng/mL

## 2019-04-21 LAB — MAGNESIUM: Magnesium: 2.3 mg/dL (ref 1.7–2.4)

## 2019-04-21 MED ORDER — LOPERAMIDE HCL 2 MG PO CAPS: 2.0000 mg | ORAL_CAPSULE | ORAL | Status: DC | PRN

## 2019-04-21 MED ORDER — POTASSIUM CHLORIDE CRYS ER 20 MEQ PO TBCR
40.0000 meq | EXTENDED_RELEASE_TABLET | Freq: Once | ORAL | Status: AC
  Administered 2019-04-21: 40 meq via ORAL
  Filled 2019-04-21: qty 2

## 2019-04-21 MED ORDER — AMOXICILLIN-POT CLAVULANATE 875-125 MG PO TABS
1.0000 | ORAL_TABLET | Freq: Two times a day (BID) | ORAL | Status: DC
  Administered 2019-04-22 (×2): 1 via ORAL
  Filled 2019-04-21 (×2): qty 1

## 2019-04-21 MED ORDER — IOHEXOL 300 MG/ML  SOLN
100.0000 mL | Freq: Once | INTRAMUSCULAR | Status: AC | PRN
  Administered 2019-04-21: 100 mL via INTRAVENOUS

## 2019-04-21 NOTE — Consult Note (Addendum)
Medical Consultation   Carlos Gutierrez  [id number]  DOB: 1950/12/09  DOA: 04/17/2019  PCP: London Pepper, MD (Confirm with patient/family/NH records and if not entered, this has to be entered at [facility] point of entry)  Outpatient Specialists: Dr. Daneen Schick [facility] speciality and name if known)   Requesting physician: Mr. Sharolyn Douglas   Reason for consultation: New onset fever   History of Present Illness: Carlos Gutierrez is an 69 y.o. male with a complex cardiac history who was admitted for chest pain work up 04/18/19. He came to cardiac cath which was negative for obstructive disease. For his atrial fibrillation he has been loaded with tikosyn. On the night of 04/20/19 he spiked a fever to 102 F. He has had subsequent fevers to 101.7T. He denies rigors. He has a non-productive cough but no shortness of breath. He has had 3 loose stools in the last 12 hours but there is no record of nature or consistancy although the patient reports they were loose. He has had no change in appetite. [name] He does endorse having some abdominal discomfort in the lower abdomen that is new. He had a colonscopy approximately  5 years ago but does not recall being told he had diverticulosis.   (The initial 2-3 lines should be focused and good to copy and paste in the HPI section of the daily progress note).  (Please avoid self-populating past medical history here)  (For level 3, the HPI must include 4+ descriptors: Location, Quality, Severity, Duration, Timing, Context, modifying factors, associated signs/symptoms and/or status of 3+ chronic problems.)       Review of Systems:  ROS As per HPI otherwise 10 point review of systems negative.  Unacceptable ROS statements: "10 systems reviewed," "Extensive" (without elaboration).  Acceptable ROS statements: "All others negative," "All others reviewed and are negative," and "All others unremarkable," with at [place] documented Can't double dip - if  using for HPI can't use for ROS   Past Medical History: Past Medical History:  Diagnosis Date  . ACL tear    RIGHT KNEE  . Atrial fibrillation ([place]) 2012   Rare occurrences. On ASA & Flecainide prn  . Atrial flutter ([place])    s/p ablation 2008. On Flecainide prn.  . Broken leg 1968  . Concussion 1966   History of 4  . DVT (deep venous thrombosis) (HCC)    LEFT LOWER LEG  . Dyslipidemia   . Essential hypertension, benign   . Insomnia   . Low back pain   . Obesity (BMI 30-39.9)   . Obstructive sleep apnea on CPAP    Mild  . PAF (paroxysmal atrial fibrillation) ([place])    Rare occurrences. On ASA & Flecainide prn  . Pure hypercholesterolemia    On Lipitor   . Right shoulder pain    Intermittent  . Status post ablation of atrial flutter 2008  . Umbilical hernia     Past Surgical History: Past Surgical History:  Procedure Laterality Date  . ablation for atrial flutter  01/2002  . CARDIAC CATHETERIZATION  12/26/01  . CARDIAC CATHETERIZATION  04/18/2019  . COLONOSCOPY WITH PROPOFOL N/A 03/12/2016   Procedure: COLONOSCOPY WITH PROPOFOL;  Surgeon: [name], MD;  Location: [facility];  Service: Endoscopy;  Laterality: N/A;  . coronary angiography  1997   40% stenosis  . ESOPHAGOGASTRODUODENOSCOPY (EGD) WITH PROPOFOL N/A 10/15/2016   Procedure: ESOPHAGOGASTRODUODENOSCOPY (EGD) WITH PROPOFOL;  Surgeon: [name], MD;  Location: [facility];  Service: Endoscopy;  Laterality: N/A;  . LEFT HEART CATH AND CORONARY ANGIOGRAPHY N/A 02/11/2017   Procedure: LEFT HEART CATH AND CORONARY ANGIOGRAPHY;  Surgeon: Belva Crome, MD;  Location: [facility];  Service: Cardiovascular;  Laterality: N/A;  . LEFT HEART CATH AND CORONARY ANGIOGRAPHY N/A 04/18/2019   Procedure: LEFT HEART CATH AND CORONARY ANGIOGRAPHY;  Surgeon: Belva Crome, MD;  Location: [facility];  Service: Cardiovascular;  Laterality: N/A;  . right knee surgery  1996   ACL, meniscus problems  . UMBILICAL  HERNIA REPAIR  04/2015     Allergies:   Allergies  Allergen Reactions  . Metformin And Related Nausea And Vomiting    Dizzy and disoriented      Social History:  Married for many years. Has two daughters, one in [place] one in [place]. He has three grandchilden. He lives with his wife who has recently had extirpation of a benign meningioma and is due for second surgery. 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   reports that he has never smoked. He has never used smokeless tobacco. He reports current alcohol use of about 7.0 - 10.0 standard drinks of alcohol per week. He reports that he does not use drugs.   Family History: Family History  Problem Relation Age of Onset  . Hypertension Mother 52  . Diabetes Mother   . COPD Mother   . Heart disease Mother   . Heart disease Father 73  . Hypertension Father   . Heart failure Father   . Heart attack Father   . Diabetes Brother 70  . Hypertension Brother 68  . Heart attack Paternal Grandfather   . Colon cancer Maternal Grandfather 56  . Stroke Neg Hx     Unacceptable: Noncontributory, unremarkable, or negative. Acceptable: Family history reviewed and not pertinent (If you reviewed it)   Physical Exam: Vitals:   04/21/19 1100 04/21/19 1232 04/21/19 1417 04/21/19 1641  BP:   115/74   Pulse:   69   Resp:      Temp: (!) 101.2 F (38.4 C) 100.1 F (37.8 C) 99.4 F (37.4 C) (!) 100.7 F (38.2 C)  TempSrc: Oral Oral Oral Oral  SpO2:   98%   Weight:      Height:        Constitutional: Appearance,  Alert and awake, oriented x3, not in any acute distress. Eyes: PERLA, EOMI, irises appear normal, anicteric sclera,  ENMT:  normal hearing or hard of hearing            Lips appears normal, oropharynx mucosa, tongue, posterior pharynx appear normal  Neck: neck appears normal, no masses, normal ROM, no thyromegaly, no JVD  CVS: RRR, no murmur rubs or gallops, no LE edema, normal pedal pulses  Respiratory:   clear to auscultation bilaterally, no wheezing, rales or rhonchi. Respiratory effort normal. No accessory muscle use.  Abdomen: mildly distended. BS + x 4. Very tender to palpation in the left lower quadrant with rebound tenderness. Mild discomfort suprapubic region - feels like a full bladder.  Musculoskeletal: : no cyanosis, clubbing or edema noted bilaterally                       Joint/bones/muscle exam, strength, contractures or atrophy Neuro: Cranial nerves II-XII intact, strength, sensation, reflexes Psych: judgement and insight appear normal, stable mood and affect, mental status Skin: no rashes or  lesions or ulcers, no induration or nodules   (Anything < 9 systems with 2 bullets each down codes to level 1) (If patient refuses exam can't bill higher level) (Make sure to document decubitus ulcers present on admission -- if possible -- and whether patient has chronic indwelling catheter at time of admission)  Data reviewed:  I have personally reviewed following labs and imaging studies Labs:  CBC: Recent Labs  Lab 04/17/19 1634 04/18/19 0427 04/19/19 0304 04/21/19 1144  WBC 3.7* 5.2 4.6 4.2  NEUTROABS 2.3  --   --   --   HGB 15.4 14.9 15.3 13.4  HCT 45.7 44.4 45.8 39.5  MCV 90.7 90.2 90.3 89.2  PLT 124* 113* 125* 101*    Basic Metabolic Panel: Recent Labs  Lab 04/17/19 1634 04/17/19 1634 04/18/19 0804 04/18/19 0804 04/18/19 1453 04/19/19 0304 04/19/19 0304 04/19/19 0834 04/19/19 1113 04/20/19 0327 04/21/19 0339  NA 139  --  140  --   --  138  --   --   --  137 136  K 3.8   < > 4.2   < >  --  4.1   < >  --   --  4.4 3.9  CL 103  --  101  --   --  108  --   --   --  105 103  CO2 26  --  24  --   --  21*  --   --   --  24 25  GLUCOSE 103*  --  137*  --   --  138*  --   --   --  122* 129*  BUN 19  --  17  --   --  16  --   --   --  18 17  CREATININE 1.36*  --  1.26*  --   --  1.31*  --   --   --  1.30* 1.40*  CALCIUM 9.1  --  9.1  --   --  8.7*  --   --   --  8.8*  8.6*  MG  --   --   --   --  2.1  --   --  2.1 2.2 2.3 2.3   < > = values in this interval not displayed.   GFR Estimated Creatinine Clearance: 64.7 mL/min (A) (by C-G formula based on SCr of 1.4 mg/dL (H)). Liver Function Tests: Recent Labs  Lab 04/17/19 1634  AST 44*  ALT 60*  ALKPHOS 82  BILITOT 1.7*  PROT 6.9  ALBUMIN 4.0   No results for input(s): LIPASE, AMYLASE in the last 168 hours. No results for input(s): AMMONIA in the last 168 hours. Coagulation profile Recent Labs  Lab 04/17/19 1634  INR 1.4*    Cardiac Enzymes: No results for input(s): CKTOTAL, CKMB, CKMBINDEX, TROPONINI in the last 168 hours. BNP: Invalid input(s): POCBNP CBG: Recent Labs  Lab 04/20/19 1709 04/20/19 2147 04/21/19 0751 04/21/19 1059 04/21/19 1641  GLUCAP 75 159* 113* 137* 130*   D-Dimer No results for input(s): DDIMER in the last 72 hours. Hgb A1c No results for input(s): HGBA1C in the last 72 hours. Lipid Profile No results for input(s): CHOL, HDL, LDLCALC, TRIG, CHOLHDL, LDLDIRECT in the last 72 hours. Thyroid function studies No results for input(s): TSH, T4TOTAL, T3FREE, THYROIDAB in the last 72 hours.  Invalid input(s): FREET3 Anemia work up No results for input(s): VITAMINB12, FOLATE, FERRITIN, TIBC, IRON, RETICCTPCT in the last 72 hours.  Urinalysis No results found for: COLORURINE, APPEARANCEUR, LABSPEC, [place], GLUCOSEU, [place], [place], KETONESUR, PROTEINUR, UROBILINOGEN, NITRITE, LEUKOCYTESUR   Microbiology Recent Results (from the past 240 hour(s))  SARS CORONAVIRUS 2 (TAT 6-24 HRS) Nasopharyngeal Nasopharyngeal Swab     Status: None   Collection Time: 04/17/19  4:10 PM   Specimen: Nasopharyngeal Swab  Result Value Ref Range Status   SARS Coronavirus 2 NEGATIVE NEGATIVE Final    Comment: (NOTE) SARS-CoV-2 target nucleic acids are NOT DETECTED. The SARS-CoV-2 RNA is generally detectable in upper and lower respiratory specimens during the acute phase of  infection. Negative results do not preclude SARS-CoV-2 infection, do not rule out co-infections with other pathogens, and should not be used as the sole basis for treatment or other patient management decisions. Negative results must be combined with clinical observations, patient history, and epidemiological information. The expected result is Negative. Fact Sheet for Patients: SugarRoll.be Fact Sheet for Healthcare Providers: https://www.woods-mathews.com/ This test is not yet approved or cleared by the [place] FDA and  has been authorized for detection and/or diagnosis of SARS-CoV-2 by FDA under an Emergency Use Authorization (EUA). This EUA will remain  in effect (meaning this test can be used) for the duration of the COVID-19 declaration under Section 56 4(b)(1) of the Act, 21 U.S.C. section 360bbb-3(b)(1), unless the authorization is terminated or revoked sooner. Performed at [facility], [place] [address], [place], [place] [address]        Inpatient Medications:   Scheduled Meds: . amoxicillin-clavulanate  1 tablet Oral Q12H  . atorvastatin  80 mg Oral q1800  . dofetilide  500 mcg Oral BID  . glipiZIDE  2.5 mg Oral Q breakfast  . insulin aspart  0-15 Units Subcutaneous TID WC  . irbesartan  150 mg Oral Daily  . loratadine  10 mg Oral Daily  . multivitamin with minerals  1 tablet Oral Daily  . rivaroxaban  20 mg Oral Q supper  . sodium chloride flush  3 mL Intravenous Q12H  . sodium chloride flush  3 mL Intravenous Q12H   Continuous Infusions: . sodium chloride    . sodium chloride       Radiological Exams on Admission: No results found.  Impression/Recommendations Principal Problem:   Coronary artery disease involving native coronary artery of native heart with unstable angina pectoris (HCC) Active Problems:   Atrial fibrillation (HCC)   HTN (hypertension)   Type 2 diabetes mellitus without complication,  without long-term current use of insulin (HCC)   Abdominal pain, LLQ (left lower quadrant)  1. Abdominal pain LLQ - patient presenting with Fever, changed bowel habit and LLQ tenderness on exam. High degree of suspicion for diverticulitis. Plan CT abd/pelvis - r/o diverticulitis  Abx coverage - checked with pharmacy re: tikosyn and abx interaction. Prefer oral   meds so d/c is not delayed: Augmentin 875 bid.   Thank you for this consultation.  Our [facility] hospitalist team will follow the patient with you.  Addendum: CT abd/pelvis negative for diverticulitis U/A negative No recorded recurrent fever  Recommend continuing Augmentin for a total of 48 hrs if no recurrent fever would d/c abx.   Time Spent: 56 min  Adella Hare M.D. [facility] 04/21/2019, 6:52 PM  [phone]

## 2019-04-21 NOTE — Progress Notes (Signed)
Progress Note  Patient Name: Carlos Gutierrez Date of Encounter: 04/21/2019  Primary Cardiologist: Sinclair Grooms, MD   Subjective   Currently feeling well.  No chest pain or shortness of breath.  Remains in sinus rhythm.  Did have a fever to 102 yesterday.  Patient was asymptomatic.  Inpatient Medications    Scheduled Meds: . atorvastatin  80 mg Oral q1800  . dofetilide  500 mcg Oral BID  . glipiZIDE  2.5 mg Oral Q breakfast  . insulin aspart  0-15 Units Subcutaneous TID WC  . irbesartan  150 mg Oral Daily  . loratadine  10 mg Oral Daily  . multivitamin with minerals  1 tablet Oral Daily  . rivaroxaban  20 mg Oral Q supper  . sodium chloride flush  3 mL Intravenous Q12H  . sodium chloride flush  3 mL Intravenous Q12H   Continuous Infusions: . sodium chloride    . sodium chloride     PRN Meds: sodium chloride, sodium chloride, acetaminophen, Melatonin, nitroGLYCERIN, ondansetron (ZOFRAN) IV, oxyCODONE, sodium chloride flush, sodium chloride flush   Vital Signs    Vitals:   04/20/19 2337 04/21/19 0346 04/21/19 0627 04/21/19 0800  BP:  (!) 142/80    Pulse:  90    Resp:  20    Temp: 99.7 F (37.6 C) (!) 102.1 F (38.9 C) 98.5 F (36.9 C) 99.8 F (37.7 C)  TempSrc: Oral Oral Oral Oral  SpO2:  100%    Weight:  103.5 kg    Height:        Intake/Output Summary (Last 24 hours) at 04/21/2019 0913 Last data filed at 04/21/2019 0800 Gross per 24 hour  Intake 600 ml  Output 1000 ml  Net -400 ml   Last 3 Weights 04/21/2019 04/19/2019 04/18/2019  Weight (lbs) 228 lb 2.8 oz 228 lb 13.4 oz 229 lb 4.5 oz  Weight (kg) 103.5 kg 103.8 kg 104 kg      Telemetry    Sinus rhythm- Personally Reviewed  ECG    Sinus rhythm, QTC 456 ms- Personally Reviewed  Physical Exam   GEN: Well nourished, well developed, in no acute distress  HEENT: normal  Neck: no JVD, carotid bruits, or masses Cardiac: RRR; no murmurs, rubs, or gallops,no edema  Respiratory:  clear to auscultation  bilaterally, normal work of breathing GI: soft, nontender, nondistended, + BS MS: no deformity or atrophy  Skin: warm and dry Neuro:  Strength and sensation are intact Psych: euthymic mood, full affect   Labs    High Sensitivity Troponin:   Recent Labs  Lab 04/17/19 1634 04/17/19 1831  TROPONINIHS 6 7      Chemistry Recent Labs  Lab 04/17/19 1634 04/18/19 0804 04/19/19 0304 04/20/19 0327 04/21/19 0339  NA 139   < > 138 137 136  K 3.8   < > 4.1 4.4 3.9  CL 103   < > 108 105 103  CO2 26   < > 21* 24 25  GLUCOSE 103*   < > 138* 122* 129*  BUN 19   < > 16 18 17   CREATININE 1.36*   < > 1.31* 1.30* 1.40*  CALCIUM 9.1   < > 8.7* 8.8* 8.6*  PROT 6.9  --   --   --   --   ALBUMIN 4.0  --   --   --   --   AST 44*  --   --   --   --  ALT 60*  --   --   --   --   ALKPHOS 82  --   --   --   --   BILITOT 1.7*  --   --   --   --   GFRNONAA 53*   < > 56* 56* 52*  GFRAA >60   < > >60 >60 60*  ANIONGAP 10   < > 9 8 8    < > = values in this interval not displayed.     Hematology Recent Labs  Lab 04/17/19 1634 04/18/19 0427 04/19/19 0304  WBC 3.7* 5.2 4.6  RBC 5.04 4.92 5.07  HGB 15.4 14.9 15.3  HCT 45.7 44.4 45.8  MCV 90.7 90.2 90.3  MCH 30.6 30.3 30.2  MCHC 33.7 33.6 33.4  RDW 14.6 14.5 14.6  PLT 124* 113* 125*    BNP Recent Labs  Lab 04/17/19 1626  BNP 46.2     DDimer No results for input(s): DDIMER in the last 168 hours.   Radiology       Cardiac Studies   04/18/2019: TTE IMPRESSIONS  1. Left ventricular ejection fraction, by visual estimation, is 50 to  55%. The left ventricle has normal function. There is no left ventricular  hypertrophy.   2. Definity contrast agent was given IV to delineate the left ventricular  endocardial borders.   3. The left ventricle has no regional wall motion abnormalities.   4. Left ventricular diastolic parameters are indeterminate.   5. Global right ventricle has normal systolic function.The right  ventricular size  is not well visualized. Right vetricular wall thickness  was not assessed.   6. Left atrial size was normal.   7. Right atrial size was normal.   8. The mitral valve is normal in structure. No evidence of mitral valve  regurgitation. No evidence of mitral stenosis.   9. The tricuspid valve is normal in structure. Tricuspid valve  regurgitation is trivial.  10. The aortic valve is grossly normal. Aortic valve regurgitation is not  visualized. No evidence of aortic valve sclerosis or stenosis.  11. The pulmonic valve was normal in structure. Pulmonic valve  regurgitation is trivial.  12. The inferior vena cava is normal in size with greater than 50%  respiratory variability, suggesting right atrial pressure of 3 mmHg.  13. TR signal is inadequate for assessing pulmonary artery systolic  pressure.      04/18/2019: LHC  30 to 40% distal left main, which is new/progressed from 2018.  Luminal irregularities in the LAD mid and distal.  First obtuse marginal 50% tandem proximal to mid stenoses.  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  Unchanged compared to prior images.  Normal left ventricular function with EF 55%.  LVEDP normal.  Atrial fibrillation developed during the night.  He is asymptomatic and did not know he was in atrial fib.   Recommendations:  Continue aggressive risk factor modification.  Increase statin intensity to max dose atorvastatin, 80 mg/day.  Consider adding antiarrhythmic therapy continuously to control atrial for rhythm.  Perhaps some of his symptoms are related to undetected atrial fibrillation.    Patient Profile     69 y.o. male with a hx of CAD (non-obstructive historically), HTN, HLD, DM, AFlutter (ablated 2003), and AFib, DVT, OSA w/CPAP  Admitted to [facility] for CP, DOE, found to have NOD by cath, preserved LVEF, and while her converted to rate controlled AFib  Assessment &  Plan    1.  Paroxysmal AFib Currently on Xarelto.  CHA2DS2-VASc of 5.  Flecainide held due to discovery of coronary artery disease.  Tikosyn load in progress.  Currently in sinus rhythm.  We Denni France continue at 500 mcg.    CHA2DS2Vasc is 5, on Xarelto, appropriately dosed  tikosyn teaching is completed with the patient, he is aware to discuss any new prescriptions or OTC medicines with his pharmacist to make sure is OK with his tikosyn  I have talked to the patient's pharmacy [facility], open weekends 1-6pm only) They have in stock dofetilide 500 and 254mcg strengths [facility] [address] [facility] all 3 doses of dofetilide in stock Cost evaluation is completed, no prior Josem Kaufmann is required  Tikosyn load follow up is in place  2.. HTN Currently well controlled  3. DM Continue glipizide and sliding scale insulin  4.  Fever: Patient had a fever to 102 yesterday.  Fortunately his temperature has come down.  He feels well.  We Val Farn[place] continue to monitor without work-up unless fever spikes again.  For questions or updates, please contact [place] Please consult [email or web address] for contact info under     Signed, Diyana Starrett Meredith Leeds, MD  04/21/2019, 9:13 AM    EP Attending  Patient seen and examined. Agree with the findings as noted above. The patient has returned to NSR before starting dofetilide. The QT is ok. He can be discharged after his dose on Sunday morning. Usual followup. Continue 500 q12.   Mikle Bosworth.D.

## 2019-04-21 NOTE — Progress Notes (Signed)
Pharmacy: Dofetilide (Tikosyn) - Follow Up Assessment and Electrolyte Replacement  Pharmacy consulted to assist in monitoring and replacing electrolytes in this 69 y.o. male admitted on 04/17/2019 undergoing dofetilide initiation. First dofetilide dose: 04/19/19  Labs:    Component Value Date/Time   K 3.9 04/21/2019 0339   MG 2.3 04/21/2019 [id number]     Plan: Potassium: K 3.8-3.9:  Give KCl 40 mEq po x1   Magnesium: Mg > 2: No additional supplementation needed  Patient has not required potassium this admit until today, will continue to follow for supplements.   Noted patient's [place] has medication, open from 1-6p on Sundays  Thank you for allowing pharmacy to participate in this patient's care   Antonietta Jewel, PharmD, [place] Pharmacist  Phone: [phone]  Please check AMION for all [place] phone numbers After 10:00 PM, call [place] [phone] 04/21/2019 8:10 AM

## 2019-04-21 NOTE — Progress Notes (Signed)
Patient w/ oral temp 100.7 at this time. Patient now with new complaints of headache and lethargy. Patient also feels that he is developing a worsening cough. Notified Dr. Curt Bears of the above and administered PRN Tylenol at this time.

## 2019-04-21 NOTE — Progress Notes (Addendum)
Patient w/ oral temp of 101.2. Patient is asymptomatic at this time. Notified Dr. Curt Bears via phone; orders to obtain CBC and procalcitonin.

## 2019-04-22 ENCOUNTER — Other Ambulatory Visit: Payer: Self-pay

## 2019-04-22 DIAGNOSIS — K5730 Diverticulosis of large intestine without perforation or abscess without bleeding: Secondary | ICD-10-CM | POA: Diagnosis not present

## 2019-04-22 DIAGNOSIS — E1165 Type 2 diabetes mellitus with hyperglycemia: Secondary | ICD-10-CM

## 2019-04-22 DIAGNOSIS — K7030 Alcoholic cirrhosis of liver without ascites: Secondary | ICD-10-CM

## 2019-04-22 DIAGNOSIS — K5792 Diverticulitis of intestine, part unspecified, without perforation or abscess without bleeding: Secondary | ICD-10-CM

## 2019-04-22 DIAGNOSIS — I25110 Atherosclerotic heart disease of native coronary artery with unstable angina pectoris: Secondary | ICD-10-CM | POA: Diagnosis not present

## 2019-04-22 DIAGNOSIS — R161 Splenomegaly, not elsewhere classified: Secondary | ICD-10-CM

## 2019-04-22 DIAGNOSIS — E871 Hypo-osmolality and hyponatremia: Secondary | ICD-10-CM

## 2019-04-22 DIAGNOSIS — I480 Paroxysmal atrial fibrillation: Secondary | ICD-10-CM | POA: Diagnosis not present

## 2019-04-22 DIAGNOSIS — K8020 Calculus of gallbladder without cholecystitis without obstruction: Secondary | ICD-10-CM | POA: Diagnosis not present

## 2019-04-22 DIAGNOSIS — R042 Hemoptysis: Secondary | ICD-10-CM | POA: Diagnosis not present

## 2019-04-22 DIAGNOSIS — N1831 Chronic kidney disease, stage 3a: Secondary | ICD-10-CM

## 2019-04-22 DIAGNOSIS — I4892 Unspecified atrial flutter: Secondary | ICD-10-CM | POA: Diagnosis not present

## 2019-04-22 DIAGNOSIS — K5732 Diverticulitis of large intestine without perforation or abscess without bleeding: Secondary | ICD-10-CM | POA: Diagnosis not present

## 2019-04-22 DIAGNOSIS — K766 Portal hypertension: Secondary | ICD-10-CM

## 2019-04-22 LAB — BASIC METABOLIC PANEL
Anion gap: 5 (ref 5–15)
BUN: 15 mg/dL (ref 8–23)
CO2: 22 mmol/L (ref 22–32)
Calcium: 8.2 mg/dL — ABNORMAL LOW (ref 8.9–10.3)
Chloride: 104 mmol/L (ref 98–111)
Creatinine, Ser: 1.17 mg/dL (ref 0.61–1.24)
GFR calc Af Amer: >60 mL/min (ref >60)
GFR calc non Af Amer: >60 mL/min (ref >60)
Glucose, Bld: 180 mg/dL — ABNORMAL HIGH (ref 70–99)
Potassium: 3.8 mmol/L (ref 3.5–5.1)
Sodium: 131 mmol/L — ABNORMAL LOW (ref 135–145)

## 2019-04-22 LAB — GLUCOSE, CAPILLARY: Glucose-Capillary: 120 mg/dL — ABNORMAL HIGH (ref 70–99)

## 2019-04-22 LAB — MAGNESIUM: Magnesium: 2 mg/dL (ref 1.7–2.4)

## 2019-04-22 MED ORDER — ATORVASTATIN CALCIUM 80 MG PO TABS: 80.0000 mg | ORAL_TABLET | Freq: Every day | ORAL | 2 refills | Status: DC

## 2019-04-22 MED ORDER — POTASSIUM CHLORIDE CRYS ER 20 MEQ PO TBCR: 20.0000 meq | EXTENDED_RELEASE_TABLET | Freq: Every day | ORAL | Status: DC

## 2019-04-22 MED ORDER — AMOXICILLIN-POT CLAVULANATE 875-125 MG PO TABS: 1.0000 | ORAL_TABLET | Freq: Two times a day (BID) | ORAL | 0 refills | Status: DC

## 2019-04-22 MED ORDER — OLMESARTAN MEDOXOMIL 40 MG PO TABS: 20.0000 mg | ORAL_TABLET | Freq: Every day | ORAL | 4 refills | Status: DC

## 2019-04-22 MED ORDER — MAGNESIUM SULFATE 2 GM/50ML IV SOLN
2.0000 g | Freq: Once | INTRAVENOUS | Status: AC
  Administered 2019-04-22: 2 g via INTRAVENOUS
  Filled 2019-04-22: qty 50

## 2019-04-22 MED ORDER — POTASSIUM CHLORIDE CRYS ER 20 MEQ PO TBCR
40.0000 meq | EXTENDED_RELEASE_TABLET | Freq: Once | ORAL | Status: AC
  Administered 2019-04-22: 40 meq via ORAL
  Filled 2019-04-22: qty 2

## 2019-04-22 MED ORDER — DOFETILIDE 500 MCG PO CAPS: 500.0000 ug | ORAL_CAPSULE | Freq: Two times a day (BID) | ORAL | 2 refills | Status: DC

## 2019-04-22 MED ORDER — POTASSIUM CHLORIDE CRYS ER 20 MEQ PO TBCR: 20.0000 meq | EXTENDED_RELEASE_TABLET | Freq: Every day | ORAL | 2 refills | Status: DC

## 2019-04-22 NOTE — Progress Notes (Signed)
Pharmacy: Dofetilide (Tikosyn) - Follow Up Assessment and Electrolyte Replacement  Pharmacy consulted to assist in monitoring and replacing electrolytes in this 69 y.o. male admitted on 04/17/2019 undergoing dofetilide initiation. First dofetilide dose: 04/19/19  Labs:    Component Value Date/Time   K 3.8 04/22/2019 0414   MG 2.0 04/22/2019 0414     Plan: Potassium: K 3.8-3.9:  Give KCl 40 mEq po x1   Magnesium: Mg 1.8-2: Give Mg 2 gm IV x1   Patient has required potassium this admit yesterday (40 mEq) in addition to today:   -Could consider discharging on potassium 20 mEq daily and get follow up BMP at office appointment  -HCTZ should be discontinued at time of discharge   Noted patient's [place] has medication, open from 1-6p on Sundays.  Thank you for allowing pharmacy to participate in this patient's care   Antonietta Jewel, PharmD, [place] Pharmacist  Phone: [phone]  Please check AMION for all [place] phone numbers After 10:00 PM, call [place] [phone] 04/22/2019 7:32 AM

## 2019-04-22 NOTE — Progress Notes (Signed)
Progress Note  Patient Name: Carlos Gutierrez Date of Encounter: 04/22/2019  Primary Cardiologist: Sinclair Grooms, MD   Subjective   Currently feeling improved on antibiotics.  Developed fevers yesterday.  Hospitalist consulted who feels that this is due to diverticulitis.  CT scan of the abdomen and pelvis showed diverticuli but no active diverticulitis and evidence of cirrhosis.  Inpatient Medications    Scheduled Meds: . amoxicillin-clavulanate  1 tablet Oral Q12H  . atorvastatin  80 mg Oral q1800  . dofetilide  500 mcg Oral BID  . glipiZIDE  2.5 mg Oral Q breakfast  . insulin aspart  0-15 Units Subcutaneous TID WC  . irbesartan  150 mg Oral Daily  . loratadine  10 mg Oral Daily  . multivitamin with minerals  1 tablet Oral Daily  . rivaroxaban  20 mg Oral Q supper  . sodium chloride flush  3 mL Intravenous Q12H  . sodium chloride flush  3 mL Intravenous Q12H   Continuous Infusions: . sodium chloride    . sodium chloride    . magnesium sulfate bolus IVPB 2 g (04/22/19 0844)   PRN Meds: sodium chloride, sodium chloride, acetaminophen, loperamide, Melatonin, nitroGLYCERIN, ondansetron (ZOFRAN) IV, oxyCODONE, sodium chloride flush, sodium chloride flush   Vital Signs    Vitals:   04/21/19 1641 04/21/19 2027 04/22/19 0509 04/22/19 0847  BP:  99/75 128/69 105/77  Pulse:  67 67   Resp:   20   Temp: (!) 100.7 F (38.2 C) 99.2 F (37.3 C) 98.7 F (37.1 C)   TempSrc: Oral Oral Oral   SpO2:  99% 97%   Weight:   103.4 kg   Height:        Intake/Output Summary (Last 24 hours) at 04/22/2019 [id number] Last data filed at 04/22/2019 0800 Gross per 24 hour  Intake 483 ml  Output 1275 ml  Net -792 ml   Last 3 Weights 04/22/2019 04/21/2019 04/19/2019  Weight (lbs) 228 lb 228 lb 2.8 oz 228 lb 13.4 oz  Weight (kg) 103.42 kg 103.5 kg 103.8 kg      Telemetry    Sinus rhythm- Personally Reviewed  ECG    Sinus rhythm- Personally Reviewed  Physical Exam   GEN: Well nourished,  well developed, in no acute distress  HEENT: normal  Neck: no JVD, carotid bruits, or masses Cardiac: RRR; no murmurs, rubs, or gallops,no edema  Respiratory:  clear to auscultation bilaterally, normal work of breathing GI: soft, nontender, nondistended, + BS MS: no deformity or atrophy  Skin: warm and dry Neuro:  Strength and sensation are intact Psych: euthymic mood, full affect   Labs    High Sensitivity Troponin:   Recent Labs  Lab 04/17/19 1634 04/17/19 1831  TROPONINIHS 6 7      Chemistry Recent Labs  Lab 04/17/19 1634 04/18/19 0804 04/20/19 0327 04/21/19 0339 04/22/19 0414  NA 139   < > 137 136 131*  K 3.8   < > 4.4 3.9 3.8  CL 103   < > 105 103 104  CO2 26   < > 24 25 22   GLUCOSE 103*   < > 122* 129* 180*  BUN 19   < > 18 17 15   CREATININE 1.36*   < > 1.30* 1.40* 1.17  CALCIUM 9.1   < > 8.8* 8.6* 8.2*  PROT 6.9  --   --   --   --   ALBUMIN 4.0  --   --   --   --  AST 44*  --   --   --   --   ALT 60*  --   --   --   --   ALKPHOS 82  --   --   --   --   BILITOT 1.7*  --   --   --   --   GFRNONAA 53*   < > 56* 52* >60  GFRAA >60   < > >60 60* >60  ANIONGAP 10   < > 8 8 5    < > = values in this interval not displayed.     Hematology Recent Labs  Lab 04/18/19 0427 04/19/19 0304 04/21/19 1144  WBC 5.2 4.6 4.2  RBC 4.92 5.07 4.43  HGB 14.9 15.3 13.4  HCT 44.4 45.8 39.5  MCV 90.2 90.3 89.2  MCH 30.3 30.2 30.2  MCHC 33.6 33.4 33.9  RDW 14.5 14.6 14.3  PLT 113* 125* 101*    BNP Recent Labs  Lab 04/17/19 1626  BNP 46.2     DDimer No results for input(s): DDIMER in the last 168 hours.   Radiology       Cardiac Studies   04/18/2019: TTE IMPRESSIONS  1. Left ventricular ejection fraction, by visual estimation, is 50 to  55%. The left ventricle has normal function. There is no left ventricular  hypertrophy.   2. Definity contrast agent was given IV to delineate the left ventricular  endocardial borders.   3. The left ventricle has no  regional wall motion abnormalities.   4. Left ventricular diastolic parameters are indeterminate.   5. Global right ventricle has normal systolic function.The right  ventricular size is not well visualized. Right vetricular wall thickness  was not assessed.   6. Left atrial size was normal.   7. Right atrial size was normal.   8. The mitral valve is normal in structure. No evidence of mitral valve  regurgitation. No evidence of mitral stenosis.   9. The tricuspid valve is normal in structure. Tricuspid valve  regurgitation is trivial.  10. The aortic valve is grossly normal. Aortic valve regurgitation is not  visualized. No evidence of aortic valve sclerosis or stenosis.  11. The pulmonic valve was normal in structure. Pulmonic valve  regurgitation is trivial.  12. The inferior vena cava is normal in size with greater than 50%  respiratory variability, suggesting right atrial pressure of 3 mmHg.  13. TR signal is inadequate for assessing pulmonary artery systolic  pressure.      04/18/2019: LHC  30 to 40% distal left main, which is new/progressed from 2018.  Luminal irregularities in the LAD mid and distal.  First obtuse marginal 50% tandem proximal to mid stenoses.  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  Unchanged compared to prior images.  Normal left ventricular function with EF 55%.  LVEDP normal.  Atrial fibrillation developed during the night.  He is asymptomatic and did not know he was in atrial fib.   Recommendations:  Continue aggressive risk factor modification.  Increase statin intensity to max dose atorvastatin, 80 mg/day.  Consider adding antiarrhythmic therapy continuously to control atrial for rhythm.  Perhaps some of his symptoms are related to undetected atrial fibrillation.    Patient Profile     69 y.o. male with a hx of CAD (non-obstructive historically), HTN, HLD, DM, AFlutter (ablated  2003), and AFib, DVT, OSA w/CPAP  Admitted to [facility] for CP, DOE, found to  have NOD by cath, preserved LVEF, and while her converted to rate controlled AFib  Assessment & Plan    1. Paroxysmal AFib Currently on Xarelto.  CHA2DS2-VASc of five.  Has been loaded with dofetilide 500 mcg twice daily.  QTC is remained stable.  We Ahlayah Tarkowski plan for discharge today should his QTC remained stable after his sixth dose.  Zaryia Markel need potassium repletion daily at discharge per pharmacy.  2.. HTN Currently well controlled.  Traver Meckes need to stop hydrochlorothiazide at discharge.  3. DM Glipizide and sliding scale insulin  4.  Diverticulitis: In discussion with hospitalist service, his fevers are likely due to diverticulitis based on his overall symptomatology.  It is certainly possible that his CT scan was done at the beginning of his course and thus does not show active inflammation.  He is felt much improved on his antibiotics.  We Masiah Woody plan for discharge on Augmentin.  For questions or updates, please contact [place] Please consult [email or web address] for contact info under     Signed, Carlos Pettengill Meredith Leeds, MD  04/22/2019, 9:39 AM    EP Attending  Patient seen and examined. Agree with the findings as noted above. The patient has returned to NSR before starting dofetilide. The QT is ok. He can be discharged after his dose on Sunday morning. Usual followup. Continue 500 q12.   Mikle Bosworth.D.

## 2019-04-22 NOTE — Progress Notes (Signed)
PROGRESS NOTE  Carlos Gutierrez [id number] DOB: [birth year]   PCP: London Pepper, MD  Patient is from: home  DOA: 04/17/2019 LOS: 4  Brief Narrative / Interim history: 69 year old male with history of A. fib, OSA, LLE DVT and alcohol use presented with left-sided chest discomfort and admitted for ACS rule out.  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  Echo with normal EF.  Hospitalist service consulted on 2/6 for fever which was felt to be acute diverticulitis.  He was a started on oral Augmentin with improvement in his fever and symptoms.  CT abdomen and pelvis revealed diverticulosis but did not confirm diverticulitis likely because it is early in the course.  His clinical picture of fever, LLQ tenderness, associated diarrhea and improvement with antibiotic is very convincing for diverticulitis.  CT abdomen and pelvis also revealed liver cirrhosis with portal hypertension and splenomegaly which is a new diagnosis.  Subjective: No major events overnight or this morning.  No complaints this morning.  Had 2 watery diarrheas yesterday about 4 PM.  No bowel movement since then.  Denies melena or hematochezia.  Abdominal pain improved.  Fever curve downtrending.  Denies chest pain or dyspnea.  Has intermittent dry cough.   Objective: Vitals:   04/21/19 1641 04/21/19 2027 04/22/19 0509 04/22/19 0847  BP:  99/75 128/69 105/77  Pulse:  67 67   Resp:   20   Temp: (!) 100.7 F (38.2 C) 99.2 F (37.3 C) 98.7 F (37.1 C)   TempSrc: Oral Oral Oral   SpO2:  99% 97%   Weight:   103.4 kg   Height:        Intake/Output Summary (Last 24 hours) at 04/22/2019 1001 Last data filed at 04/22/2019 0800 Gross per 24 hour  Intake 483 ml  Output 1275 ml  Net -792 ml   Filed Weights   04/19/19 0556 04/21/19 0346 04/22/19 0509  Weight: 103.8 kg 103.5 kg 103.4 kg    Examination:  GENERAL: No acute distress.  Appears well.  HEENT: MMM.  Vision and hearing grossly intact.  NECK: Supple.  No apparent JVD.   RESP:  No IWOB. Good air movement bilaterally. CVS:  RRR. Heart sounds normal.  ABD/GI/GU: Bowel sounds present. Soft.  LLQ tenderness.  No rebound or guarding. MSK/EXT:  Moves extremities. No apparent deformity. No edema.  SKIN: no apparent skin lesion or wound NEURO: Awake, alert and oriented appropriately.  No apparent focal neuro deficit. PSYCH: Calm. Normal affect.  Procedures:  2/3-left heart catheterization with nonobstructive CAD  Assessment & Plan: Mild acute diverticulitis: clinical picture convincing and although CT abdomen and pelvis did not confirm diverticulitis but diverticulosis.  Fever curve downtrending.  Diarrhea seems to have subsided.  Tolerating diet. -Continue Augmentin for a total of 10 days -Outpatient follow-up with GI in 3 to 4 weeks for colonoscopy.  Liver cirrhosis/splenomegaly/portal hypertension: Incidental finding on CT abdomen and pelvis.  Reports about 6-7 beers a week.  No history of hepatitis.  No ascites or signs of fluid overload.  No signs of SBP. -Outpatient follow-up with GI.  Chest pain: LHC on 2/3 with nonobstructive CAD. -Per cardiology  Paroxysmal A. Fib: CHA2DS2-VASc score 5.  -On Tikosyn and Xarelto per cardiology  Essential hypertension: Normotensive -Continue home medication per cardiology.  Controlled DM-2 with hyperglycemia Recent Labs    04/21/19 1641 04/21/19 2108 04/22/19 0740  GLUCAP 130* 86 120*  -Continue home glipizide -Agree with high intensity statin -Would benefit from newer agents with cardiovascular benefits.  Hyponatremia: Likely due to thiazide and ARB. -Consider holding thiazide  CKD-3a: Stable               DVT prophylaxis: On Xarelto for A. fib Code Status: Full code Family Communication: Patient and/or RN.  Discharge barrier: None from our standpoint Patient is from: Home Final disposition: Likely home  Consultants: We are   Microbiology summarized: [id number] screen negative.  Sch  Meds:  Scheduled Meds:  amoxicillin-clavulanate  1 tablet Oral Q12H   atorvastatin  80 mg Oral q1800   dofetilide  500 mcg Oral BID   glipiZIDE  2.5 mg Oral Q breakfast   insulin aspart  0-15 Units Subcutaneous TID WC   irbesartan  150 mg Oral Daily   loratadine  10 mg Oral Daily   multivitamin with minerals  1 tablet Oral Daily   rivaroxaban  20 mg Oral Q supper   sodium chloride flush  3 mL Intravenous Q12H   sodium chloride flush  3 mL Intravenous Q12H   Continuous Infusions:  sodium chloride     sodium chloride     PRN Meds:.sodium chloride, sodium chloride, acetaminophen, loperamide, Melatonin, nitroGLYCERIN, ondansetron (ZOFRAN) IV, oxyCODONE, sodium chloride flush, sodium chloride flush  Antimicrobials: Anti-infectives (From admission, onward)   Start     Dose/Rate Route Frequency Ordered Stop   04/21/19 2200  amoxicillin-clavulanate (AUGMENTIN) 875-125 MG per tablet 1 tablet     1 tablet Oral Every 12 hours 04/21/19 1843         I have personally reviewed the following labs and images: CBC: Recent Labs  Lab 04/17/19 1634 04/18/19 0427 04/19/19 0304 04/21/19 1144  WBC 3.7* 5.2 4.6 4.2  NEUTROABS 2.3  --   --   --   HGB 15.4 14.9 15.3 13.4  HCT 45.7 44.4 45.8 39.5  MCV 90.7 90.2 90.3 89.2  PLT 124* 113* 125* 101*   BMP &GFR Recent Labs  Lab 04/18/19 0804 04/18/19 1453 04/19/19 0304 04/19/19 0834 04/19/19 1113 04/20/19 0327 04/21/19 0339 04/22/19 0414  NA 140  --  138  --   --  137 136 131*  K 4.2  --  4.1  --   --  4.4 3.9 3.8  CL 101  --  108  --   --  105 103 104  CO2 24  --  21*  --   --  24 25 22   GLUCOSE 137*  --  138*  --   --  122* 129* 180*  BUN 17  --  16  --   --  18 17 15   CREATININE 1.26*  --  1.31*  --   --  1.30* 1.40* 1.17  CALCIUM 9.1  --  8.7*  --   --  8.8* 8.6* 8.2*  MG  --    < >  --  2.1 2.2 2.3 2.3 2.0   < > = values in this interval not displayed.   Estimated Creatinine Clearance: 77.4 mL/min (by C-G formula  based on SCr of 1.17 mg/dL). Liver & Pancreas: Recent Labs  Lab 04/17/19 1634  AST 44*  ALT 60*  ALKPHOS 82  BILITOT 1.7*  PROT 6.9  ALBUMIN 4.0   No results for input(s): LIPASE, AMYLASE in the last 168 hours. No results for input(s): AMMONIA in the last 168 hours. Diabetic: No results for input(s): HGBA1C in the last 72 hours. Recent Labs  Lab 04/21/19 0751 04/21/19 1059 04/21/19 1641 04/21/19 2108 04/22/19 0740  GLUCAP 113* 137*  130* 86 120*   Cardiac Enzymes: No results for input(s): CKTOTAL, CKMB, CKMBINDEX, TROPONINI in the last 168 hours. No results for input(s): PROBNP in the last 8760 hours. Coagulation Profile: Recent Labs  Lab 04/17/19 1634  INR 1.4*   Thyroid Function Tests: No results for input(s): TSH, T4TOTAL, FREET4, T3FREE, THYROIDAB in the last 72 hours. Lipid Profile: No results for input(s): CHOL, HDL, LDLCALC, TRIG, CHOLHDL, LDLDIRECT in the last 72 hours. Anemia Panel: No results for input(s): VITAMINB12, FOLATE, FERRITIN, TIBC, IRON, RETICCTPCT in the last 72 hours. Urine analysis:    Component Value Date/Time   COLORURINE YELLOW 04/21/2019 2010   APPEARANCEUR CLEAR 04/21/2019 2010   LABSPEC 1.009 04/21/2019 2010   PHURINE 6.0 04/21/2019 2010   GLUCOSEU NEGATIVE 04/21/2019 2010   [place] NEGATIVE 04/21/2019 2010   [place] NEGATIVE 04/21/2019 2010   [place] NEGATIVE 04/21/2019 2010   PROTEINUR NEGATIVE 04/21/2019 2010   NITRITE NEGATIVE 04/21/2019 2010   LEUKOCYTESUR NEGATIVE 04/21/2019 2010   Sepsis Labs: Invalid input(s): PROCALCITONIN, [place]  Microbiology: Recent Results (from the past 240 hour(s))  SARS CORONAVIRUS 2 (TAT 6-24 HRS) Nasopharyngeal Nasopharyngeal Swab     Status: None   Collection Time: 04/17/19  4:10 PM   Specimen: Nasopharyngeal Swab  Result Value Ref Range Status   SARS Coronavirus 2 NEGATIVE NEGATIVE Final    Comment: (NOTE) SARS-CoV-2 target nucleic acids are NOT DETECTED. The SARS-CoV-2 RNA is  generally detectable in upper and lower respiratory specimens during the acute phase of infection. Negative results do not preclude SARS-CoV-2 infection, do not rule out co-infections with other pathogens, and should not be used as the sole basis for treatment or other patient management decisions. Negative results must be combined with clinical observations, patient history, and epidemiological information. The expected result is Negative. Fact Sheet for Patients: SugarRoll.be Fact Sheet for Healthcare Providers: https://www.woods-mathews.com/ This test is not yet approved or cleared by the [place] FDA and  has been authorized for detection and/or diagnosis of SARS-CoV-2 by FDA under an Emergency Use Authorization (EUA). This EUA will remain  in effect (meaning this test can be used) for the duration of the COVID-19 declaration under Section 56 4(b)(1) of the Act, 21 U.S.C. section 360bbb-3(b)(1), unless the authorization is terminated or revoked sooner. Performed at [facility], [place] [address] [place],  [address]     Radiology Studies: CT ABDOMEN PELVIS W CONTRAST  Result Date: 04/22/2019 CLINICAL DATA:  Diverticulitis. Left lower quadrant pain. Fever. EXAM: CT ABDOMEN AND PELVIS WITH CONTRAST TECHNIQUE: Multidetector CT imaging of the abdomen and pelvis was performed using the standard protocol following bolus administration of intravenous contrast. CONTRAST:  163mL OMNIPAQUE IOHEXOL 300 MG/ML  SOLN COMPARISON:  None. FINDINGS: Lower chest: The lung bases are clear. The heart size is normal. Hepatobiliary: The liver surface appears somewhat nodular. Cholelithiasis without acute inflammation.There is no biliary ductal dilation. Pancreas: Normal contours without ductal dilatation. No peripancreatic fluid collection. Spleen: The spleen is enlarged measuring approximately 15 cm craniocaudad (coronal series 6, image 74).  Adrenals/Urinary Tract: --Adrenal glands: No adrenal hemorrhage. --Right kidney/ureter: No hydronephrosis or perinephric hematoma. --Left kidney/ureter: No hydronephrosis or perinephric hematoma. --Urinary bladder: Unremarkable. Stomach/Bowel: --Stomach/Duodenum: No hiatal hernia or other gastric abnormality. Normal duodenal course and caliber. --Small bowel: No dilatation or inflammation. --Colon: There are scattered colonic diverticula without CT evidence for diverticulitis. --Appendix: Normal. Vascular/Lymphatic: Atherosclerotic calcification is present within the non-aneurysmal abdominal aorta, without hemodynamically significant stenosis. --No retroperitoneal lymphadenopathy. --No mesenteric lymphadenopathy. --No  pelvic or inguinal lymphadenopathy. Reproductive: The prostate gland is enlarged. Other: No ascites or free air. The abdominal wall is normal. Musculoskeletal. No acute displaced fractures. IMPRESSION: 1. Scattered colonic diverticula without evidence of diverticulitis. 2. 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 3. Cholelithiasis without evidence of acute cholecystitis. 4. Prostatomegaly. 5. Aortic Atherosclerosis (ICD10-I70.0). Electronically Signed   By: Constance Holster M.D.   On: 04/22/2019 01:16      Mystery Schrupp T. Herman  If 7PM-7AM, please contact night-coverage [email or web address] Password [email or web address] 04/22/2019, 10:01 AM

## 2019-04-22 NOTE — Discharge Summary (Addendum)
Discharge Summary    Patient ID: GUADLUPE STOLTZFUS MRN: [id number]; DOB: June 06, 1950  Admit date: 04/17/2019 Discharge date: 04/22/2019  Primary Care Provider: London Pepper, MD  Primary Cardiologist: Sinclair Grooms, MD  Primary Electrophysiologist:  Cristopher Peru, MD   Discharge Diagnoses    Principal Problem:   Coronary artery disease involving native coronary artery of native heart with unstable angina pectoris [facility]) Active Problems:   Atrial fibrillation ([place])   HTN (hypertension)   Type 2 diabetes mellitus without complication, without long-term current use of insulin ([place])   Abdominal pain, LLQ (left lower quadrant)  Diagnostic Studies/Procedures    LHC 04/18/19:   30 to 40% distal left main, which is new/progressed from 2018.  Luminal irregularities in the LAD mid and distal.  First obtuse marginal 50% tandem proximal to mid stenoses.  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  Unchanged compared to prior images.  Normal left ventricular function with EF 55%.  LVEDP normal.  Atrial fibrillation developed during the night.  He is asymptomatic and did not know he was in atrial fib.  Recommendations:  Continue aggressive risk factor modification.  Increase statin intensity to max dose atorvastatin, 80 mg/day.  Consider adding antiarrhythmic therapy continuously to control atrial for rhythm.  Perhaps some of his symptoms are related to undetected atrial fibrillation. ____________  Echocardiogram 04/18/19:   1. Left ventricular ejection fraction, by visual estimation, is 50 to  55%. The left ventricle has normal function. There is no left ventricular  hypertrophy.  2. Definity contrast agent was given IV to delineate the left ventricular  endocardial borders.  3. The left ventricle has no regional wall motion abnormalities.  4. Left ventricular diastolic parameters are indeterminate.  5. Global right  ventricle has normal systolic function.The right  ventricular size is not well visualized. Right vetricular wall thickness  was not assessed.  6. Left atrial size was normal.  7. Right atrial size was normal.  8. The mitral valve is normal in structure. No evidence of mitral valve  regurgitation. No evidence of mitral stenosis.  9. The tricuspid valve is normal in structure. Tricuspid valve  regurgitation is trivial.  10. The aortic valve is grossly normal. Aortic valve regurgitation is not  visualized. No evidence of aortic valve sclerosis or stenosis.  11. The pulmonic valve was normal in structure. Pulmonic valve  regurgitation is trivial.  12. The inferior vena cava is normal in size with greater than 50%  respiratory variability, suggesting right atrial pressure of 3 mmHg.  13. TR signal is inadequate for assessing pulmonary artery systolic  pressure.   History of Present Illness     Carlos Gutierrez is a 69 y.o. male with a hx of CAD (non-obstructive historically), HTN, HLD, DM, AFlutter (ablated 2003), and AFib, DVT, OSA w/CPAP, who was admitted for chest pain and was seen by EP for the evaluation of AFib and AAD recommendations.   Hospital Course     Mr. Redditt was seen in the office 04/18/19 by Richardson Dopp, PA for c/o left-sided chest discomfort described as an ache. This had been going on for approximately the last two weeks prior to presentation. He reported some radiation to his left shoulder and arm with no percieved changes with exertion. He did notice intermittent SOB with minimal activity which was noted to be unusual for him with associated nausea. He denied syncope, orthopnea or leg swelling. He did have  2 episodes of AFib last weekend which resolved with his PRN flecainide.    Given his hx of moderate nonobstructive coronary artery disease by cardiac catheterization in 2018 plan was to admit for repeat LHC and repeat echocardiogram. His ECG during the OV did not  demonstrate any significant ischemic changes however given his risk factors, it was  recommended that he admission to the hospital for cardiac catheterization. Case reviewed with Dr. Tamala Julian. He was noted to be on Rivaroxaban and would need this held at least 24 hours before proceeding with cardiac catheterization.   Cath performed 04/18/19 which showed a 30 to 40% distal left main, which is new/progressed from 2018. Luminal irregularities in the LAD mid and distal. First obtuse marginal 50% tandem proximal to mid stenoses. 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 Unchanged compared to prior images. There was normal left ventricular function with an of EF 55%. He developed atrial fibrillation developed during the night and was noted to be asymptomatic. Recommendations were to continue with aggressive risk factor modification. His statin therapy was increased to max intensity. It was also recommended that EP see his for consideration of antiarrhythmic therapy for his AF as it was felt that his symptoms were related to undetected atrial fibrillation.  EP was consulted 04/18/19 at which time he reported that his AF would make him feel sluggish and fatigued. Per cath, he had progression/new CAD, non-obstructive including 50-70% RCA lesion (this similar to 2018) and new/progressed LM disease 30-40%. Flecainide was not felt to be the best choice for AAD. He has baseline bradycardia and therefore  Tikosyn was felt to likely be the most reasonable next option.    His HCTZ was held prior to Tikosyn initiation. He was also felt to be a good candidate for an AFib ablation (prior remote AF ablation). His LA is described as normal in size, measured 70mm by his echocardiogram this admission. Both medication and ablation options were discussed and the patient preferred to start with Tikosyn therapy.   Tikosyn was started on 04/19/19. Baseline Qtc was acceptable with  stable electrolytes. Plan was to start Tikosyn at 580mcg BID and monitored closely for 3.5 days.   He was stable with Tikosyn load with stable Qtc. Electrolytes needed replacing several times, requiring K+ and Mg+ supplementation. He did develop a fever of 102F and loose stools with LLQ pain at which time the hospitalist service (consulted 04/21/19) was asked to evaluate for possible infection. There was high degree of suspicion for diverticulitis. A CT ab/pelvis was obtained which was negative for diverticulitis however did show some degree of cirrhosis. He Treveon Bourcier be referred to GI for further workup of this.  Recommendations are for Augmentin 875 mg p.o. twice daily for 10 days along with OTC probiotic of his choice.  Per care management:  Called patient's pharmacy [facility] [phone], they have all doses in generic. Chatted PA prescription Doshia Dalia be for generic. Entered benefit check for following :    Dofetilide 542mcg BID,  x 30 days   Dofetilide 268mcg BID,     x 30 days   Dofetilide19mcg BID x 30 days   Patient from home. Possible discharge Sunday on Dofetilide, benefits check came back $15 co pay, patient's pharmacy [facility] has all doses in stock.   Per EP PA notation: Tikosyn teaching was completed with the patient, he is aware to discuss any new prescriptions or OTC medicines with his  pharmacist to make sure is OK with his Tikosyn. PA spoke with patient's pharmacy [facility], open weekends 1-6pm only). They have in stock dofetilide 500 and 231mcg strengths. [facility] [address] has all 3 doses of dofetilide in stock. Cost evaluation is completed, no prior Josem Kaufmann is required   Hospital course includes:  Paroxysmal AFib Currently on Xarelto. CHA2DS2-VASc of five.  Has been loaded with dofetilide 500 mcg twice daily.  QTC is remained stable.  We Elven Laboy plan for discharge today should his QTC remained stable after his sixth dose.  Agustin Swatek need potassium repletion daily at discharge per  pharmacy.  HTN Currently well controlled.  Christophe Rising need to stop hydrochlorothiazide at discharge.  DM Continue home regimen  Diverticulitis: In discussion with hospitalist service, his fevers are likely due to diverticulitis based on his overall symptomatology.  It is certainly possible that his CT scan was done at the beginning of his course and thus does not show active inflammation.  He is felt much improved on his antibiotics.  We Kamani Magnussen plan for discharge on Augmentin.  We Tonnie Stillman referred to GI for further assessment  Consultants: Hospitalist service  The patient was seen and examined by Dr. Curt Bears who feels that he is stable and ready for discharge today, 04/22/2019.   Who feels that he is stable and ready for discharge today, 04/22/19.   Did the patient have an acute coronary syndrome (MI, NSTEMI, STEMI, etc) this admission?:  No                               Did the patient have a percutaneous coronary intervention (stent / angioplasty)?:  No.   _____________  Discharge Vitals Blood pressure 105/77, pulse 67, temperature 98.7 F (37.1 C), temperature source Oral, resp. rate 20, height 6\' 1"  (1.854 m), weight 103.4 kg, SpO2 97 %.  Filed Weights   04/19/19 0556 04/21/19 0346 04/22/19 0509  Weight: 103.8 kg 103.5 kg 103.4 kg    Labs & Radiologic Studies    CBC Recent Labs    04/21/19 1144  WBC 4.2  HGB 13.4  HCT 39.5  MCV 89.2  PLT [id number]*   Basic Metabolic Panel Recent Labs    04/21/19 0339 04/22/19 0414  NA 136 131*  K 3.9 3.8  CL 103 104  CO2 25 22  GLUCOSE 129* 180*  BUN 17 15  CREATININE 1.40* 1.17  CALCIUM 8.6* 8.2*  MG 2.3 2.0   Liver Function Tests No results for input(s): AST, ALT, ALKPHOS, BILITOT, PROT, ALBUMIN in the last 72 hours. No results for input(s): LIPASE, AMYLASE in the last 72 hours. High Sensitivity Troponin:   Recent Labs  Lab 04/17/19 1634 04/17/19 1831  TROPONINIHS 6 7    BNP Invalid input(s): POCBNP D-Dimer No results for  input(s): DDIMER in the last 72 hours. Hemoglobin A1C No results for input(s): HGBA1C in the last 72 hours. Fasting Lipid Panel No results for input(s): CHOL, HDL, LDLCALC, TRIG, CHOLHDL, LDLDIRECT in the last 72 hours. Thyroid Function Tests No results for input(s): TSH, T4TOTAL, T3FREE, THYROIDAB in the last 72 hours.  Invalid input(s): FREET3 _____________  X-ray chest PA and lateral  Result Date: 04/17/2019 CLINICAL DATA:  69 year old male with shortness of breath. EXAM: CHEST - 2 VIEW COMPARISON:  Chest radiograph dated 09/21/2016. FINDINGS: Minimal bibasilar linear atelectasis/scarring. No focal consolidation, pleural effusion, pneumothorax. The cardiac silhouette is within normal limits. No acute osseous pathology. IMPRESSION: No  active cardiopulmonary disease. Electronically Signed   By: Anner Crete M.D.   On: 04/17/2019 15:40   CT ABDOMEN PELVIS W CONTRAST  Result Date: 04/22/2019 CLINICAL DATA:  Diverticulitis. Left lower quadrant pain. Fever. EXAM: CT ABDOMEN AND PELVIS WITH CONTRAST TECHNIQUE: Multidetector CT imaging of the abdomen and pelvis was performed using the standard protocol following bolus administration of intravenous contrast. CONTRAST:  162mL OMNIPAQUE IOHEXOL 300 MG/ML  SOLN COMPARISON:  None. FINDINGS: Lower chest: The lung bases are clear. The heart size is normal. Hepatobiliary: The liver surface appears somewhat nodular. Cholelithiasis without acute inflammation.There is no biliary ductal dilation. Pancreas: Normal contours without ductal dilatation. No peripancreatic fluid collection. Spleen: The spleen is enlarged measuring approximately 15 cm craniocaudad (coronal series 6, image 74). Adrenals/Urinary Tract: --Adrenal glands: No adrenal hemorrhage. --Right kidney/ureter: No hydronephrosis or perinephric hematoma. --Left kidney/ureter: No hydronephrosis or perinephric hematoma. --Urinary bladder: Unremarkable. Stomach/Bowel: --Stomach/Duodenum: No hiatal hernia or  other gastric abnormality. Normal duodenal course and caliber. --Small bowel: No dilatation or inflammation. --Colon: There are scattered colonic diverticula without CT evidence for diverticulitis. --Appendix: Normal. Vascular/Lymphatic: Atherosclerotic calcification is present within the non-aneurysmal abdominal aorta, without hemodynamically significant stenosis. --No retroperitoneal lymphadenopathy. --No mesenteric lymphadenopathy. --No pelvic or inguinal lymphadenopathy. Reproductive: The prostate gland is enlarged. Other: No ascites or free air. The abdominal wall is normal. Musculoskeletal. No acute displaced fractures. IMPRESSION: 1. Scattered colonic diverticula without evidence of diverticulitis. 2. 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 3. Cholelithiasis without evidence of acute cholecystitis. 4. Prostatomegaly. 5. Aortic Atherosclerosis (ICD10-I70.0). Electronically Signed   By: Constance Holster M.D.   On: 04/22/2019 01:16   CARDIAC CATHETERIZATION  Addendum Date: 04/18/2019    30 to 40% distal left main, which is new/progressed from 2018.  Luminal irregularities in the LAD mid and distal.  First obtuse marginal 50% tandem proximal to mid stenoses.  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  Unchanged compared to prior images.  Normal left ventricular function with EF 55%.  LVEDP normal.  Atrial fibrillation developed during the night.  He is asymptomatic and did not know he was in atrial fib. Recommendations:  Continue aggressive risk factor modification.  Increase statin intensity to max dose atorvastatin, 80 mg/day.  Consider adding antiarrhythmic therapy continuously to control atrial for rhythm.  Perhaps some of his symptoms are related to undetected atrial fibrillation.  Result Date: 04/18/2019  30 to 40% distal left main  Luminal irregularities in the LAD mid  and distal.  First obtuse marginal 50% tandem proximal to mid stenoses.  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  Unchanged compared to prior images.  Normal left ventricular function with EF 55%.  LVEDP normal.  Atrial fibrillation developed during the night.  He is asymptomatic and did not know he was in atrial fib. Recommendations:  Continue aggressive risk factor modification.  Increase statin intensity to max dose atorvastatin, 80 mg/day.  Consider adding antiarrhythmic therapy continuously to control atrial for rhythm.  Perhaps some of his symptoms are related to undetected atrial fibrillation.  ECHOCARDIOGRAM COMPLETE  Result Date: 04/18/2019   ECHOCARDIOGRAM REPORT   Patient Name:   AAYUSH NAPIERALSKI Date of Exam: 04/18/2019 Medical Rec #:  [id number]        Height:       73.0 in Accession #:    [id number]  Weight:       229.3 lb Date of Birth:  05-21-1950        BSA:          2.28 m Patient Age:    42 years         BP:           117/77 mmHg Patient Gender: M                HR:           66 bpm. Exam Location:  Inpatient Procedure: 2D Echo, Color Doppler, Cardiac Doppler and Intracardiac            Opacification Agent Indications:    R06.9 DOE; I48.91* Unspecified atrial fibrillation; R07.9* Chest                 pain, unspecified  History:        Patient has prior history of Echocardiogram examinations, most                 recent 09/21/2016. CAD, Arrythmias:Atrial Fibrillation; Risk                 Factors:Hypertension, Diabetes, Sleep Apnea and Dyslipidemia.  Sonographer:    Raquel Sarna Senior RDCS Referring Phys: [place]  1. Left ventricular ejection fraction, by visual estimation, is 50 to 55%. The left ventricle has normal function. There is no left ventricular hypertrophy.  2. Definity contrast agent was given IV to delineate the left ventricular endocardial borders.  3. The left ventricle has no regional wall  motion abnormalities.  4. Left ventricular diastolic parameters are indeterminate.  5. Global right ventricle has normal systolic function.The right ventricular size is not well visualized. Right vetricular wall thickness was not assessed.  6. Left atrial size was normal.  7. Right atrial size was normal.  8. The mitral valve is normal in structure. No evidence of mitral valve regurgitation. No evidence of mitral stenosis.  9. The tricuspid valve is normal in structure. Tricuspid valve regurgitation is trivial. 10. The aortic valve is grossly normal. Aortic valve regurgitation is not visualized. No evidence of aortic valve sclerosis or stenosis. 11. The pulmonic valve was normal in structure. Pulmonic valve regurgitation is trivial. 12. The inferior vena cava is normal in size with greater than 50% respiratory variability, suggesting right atrial pressure of 3 mmHg. 13. TR signal is inadequate for assessing pulmonary artery systolic pressure. FINDINGS  Left Ventricle: Left ventricular ejection fraction, by visual estimation, is 50 to 55%. The left ventricle has normal function. Definity contrast agent was given IV to delineate the left ventricular endocardial borders. The left ventricle has no regional wall motion abnormalities. There is no left ventricular hypertrophy. Left ventricular diastolic parameters are indeterminate. Right Ventricle: The right ventricular size is not well visualized. Right vetricular wall thickness was not assessed. Global RV systolic function is has normal systolic function. Left Atrium: Left atrial size was normal in size. Right Atrium: Right atrial size was normal in size Pericardium: There is no evidence of pericardial effusion. Mitral Valve: The mitral valve is normal in structure. No evidence of mitral valve regurgitation. No evidence of mitral valve stenosis by observation. Tricuspid Valve: The tricuspid valve is normal in structure. Tricuspid valve regurgitation is trivial. Aortic  Valve: The aortic valve is grossly normal. Aortic valve regurgitation is not visualized. The aortic valve is structurally normal, with no evidence of sclerosis or stenosis. Pulmonic Valve: The pulmonic valve was  normal in structure. Pulmonic valve regurgitation is trivial. Pulmonic regurgitation is trivial. Aorta: The aortic root, ascending aorta and aortic arch are all structurally normal, with no evidence of dilitation or obstruction. Venous: The inferior vena cava was not well visualized. The inferior vena cava is normal in size with greater than 50% respiratory variability, suggesting right atrial pressure of 3 mmHg. IAS/Shunts: The interatrial septum was not well visualized. 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  LEFT VENTRICLE PLAX 2D LVIDd:         4.90 cm LVIDs:         4.10 cm LV PW:         1.00 cm LV IVS:        1.00 cm LVOT diam:     2.10 cm LV SV:         39 ml LV SV Index:   16.51 LVOT Area:     3.46 cm  RIGHT VENTRICLE RV S prime:     10.20 cm/s TAPSE (M-mode): 1.7 cm LEFT ATRIUM             Index       RIGHT ATRIUM           Index LA diam:        3.70 cm 1.62 cm/m  RA Area:     19.30 cm LA Vol (A2C):   52.3 ml 22.94 ml/m RA Volume:   57.50 ml  25.22 ml/m LA Vol (A4C):   54.2 ml 23.77 ml/m LA Biplane Vol: 53.4 ml 23.42 ml/m  AORTIC VALVE LVOT Vmax:   77.68 cm/s LVOT Vmean:  53.250 cm/s LVOT VTI:    0.130 m  AORTA Ao Root diam: 3.70 cm Ao Asc diam:  3.40 cm  SHUNTS Systemic VTI:  0.13 m Systemic Diam: 2.10 cm  Cherlynn Kaiser MD Electronically signed by Cherlynn Kaiser MD Signature Date/Time: 04/18/2019/1:18:33 PM    Final    Disposition   Pt is being discharged home today in good condition.  Follow-up Plans & Appointments    Follow-up Information    [facility] Follow up.   Specialty: Cardiology Why: 04/30/2019 @ 11;30AM with [facility] PA Contact information: [address] [id number] [address] [place]  [place]       Evans Lance, MD Follow up.   Specialty: Cardiology Why: 05/23/2019 @ 10:15AM Contact information: [address] [place] [address] [place] [address] [phone]          Discharge Instructions    Call MD for:  difficulty breathing, headache or visual disturbances   Complete by: As directed    Call MD for:  extreme fatigue   Complete by: As directed    Call MD for:  hives   Complete by: As directed    Call MD for:  persistant dizziness or light-headedness   Complete by: As directed    Call MD for:  persistant nausea and vomiting   Complete by: As directed    Call MD for:  redness, tenderness, or signs of infection (pain, swelling, redness, odor or green/yellow discharge around incision site)   Complete by: As directed    Call MD for:  severe uncontrolled pain   Complete by: As directed    Call MD for:  temperature >100.4   Complete by: As directed    Diet - low sodium heart healthy   Complete by: As directed    Discharge instructions  Complete by: As directed    No driving for 3-5 days. No lifting over 5 lbs for 1 week. No sexual activity for 1 week. Keep procedure site clean & dry. If you notice increased pain, swelling, bleeding or pus, call/return!  You may shower, but no soaking baths/hot tubs/pools for 1 week.   Please see AVS for follow up appointments   Increase activity slowly   Complete by: As directed      Discharge Medications   Allergies as of 04/22/2019      Reactions   Metformin And Related Nausea And Vomiting   Dizzy and disoriented       Medication List    STOP taking these medications   flecainide 100 MG tablet Commonly known as: TAMBOCOR   hydrochlorothiazide 12.5 MG capsule Commonly known as: MICROZIDE   olmesartan-hydrochlorothiazide 20-12.5 MG tablet Commonly known as: BENICAR HCT     TAKE these medications   acetaminophen 500 MG tablet Commonly known as: TYLENOL Take 1,000 mg by mouth every 6  (six) hours as needed for moderate pain or headache.   amoxicillin-clavulanate 875-125 MG tablet Commonly known as: AUGMENTIN Take 1 tablet by mouth every 12 (twelve) hours for 10 days.   aspirin EC 81 MG tablet Take 1 tablet (81 mg total) by mouth daily.   atorvastatin 80 MG tablet Commonly known as: LIPITOR Take 1 tablet (80 mg total) by mouth daily at 6 PM. What changed:   medication strength  how much to take  when to take this   cetirizine 10 MG tablet Commonly known as: ZYRTEC Take 10 mg by mouth at bedtime as needed for allergies.   dofetilide 500 MCG capsule Commonly known as: TIKOSYN Take 1 capsule (500 mcg total) by mouth 2 (two) times daily.   glipiZIDE 2.5 MG 24 hr tablet Commonly known as: GLUCOTROL XL Take 2.5 mg by mouth daily with breakfast.   loperamide 2 MG tablet Commonly known as: IMODIUM A-D Take 2 mg by mouth 4 (four) times daily as needed for diarrhea or loose stools.   Melatonin 5 MG Caps Take 5 mg by mouth at bedtime as needed (sleep).   multivitamin with minerals tablet Take 1 tablet by mouth daily.   nitroGLYCERIN 0.4 MG SL tablet Commonly known as: NITROSTAT Place 1 tablet (0.4 mg total) under the tongue every 5 (five) minutes as needed. What changed: reasons to take this   olmesartan 20 MG tablet Commonly known as: BENICAR Take 1 tablet (20 mg total) by mouth daily.   pantoprazole 40 MG tablet Commonly known as: PROTONIX TAKE 1 TABLET BY MOUTH TWICE DAILY.   potassium chloride SA 20 MEQ tablet Commonly known as: KLOR-CON Take 1 tablet (20 mEq total) by mouth daily.   rivaroxaban 20 MG Tabs tablet Commonly known as: Xarelto TAKE 1 TABLET ONCE DAILY WITH DINNER. What changed:   how much to take  how to take this  when to take this  additional instructions   vitamin C 1000 MG tablet Take 1,000 mg by mouth daily.   Vitamin D 50 MCG (2000 UT) tablet Take 2,000 Units by mouth daily.       Outstanding Labs/Studies     BMET   Duration of Discharge Encounter   Greater than 30 minutes including physician time.  Signed, Kathyrn Drown, NP 04/22/2019, 10:33 AM  I have seen and examined this patient with Kathyrn Drown.  Agree with above, note added to reflect my findings.  On exam, RRR, no murmurs,  lungs clear.  Patient admitted to the hospital and is now status post left heart catheterization that showed mild obstructive coronary disease.  Due to that he was taken off of flecainide and loaded on dofetilide.  Tolerated the load without issue.  Plan to follow-up in AF clinic.  Did spike fevers during his hospitalization.  Internal medicine consulted and felt this was due to diverticulitis.  Started on Augmentin for a 10-day course.  CT scan did show cirrhosis as well.  Patient Laelynn Blizzard follow up in GI clinic for further management.  Beulah Capobianco M. Ciela Mahajan MD 04/23/2019 8:03 AM

## 2019-04-30 ENCOUNTER — Ambulatory Visit (HOSPITAL_COMMUNITY)
Admission: RE | Admit: 2019-04-30 | Discharge: 2019-04-30 | Disposition: A | Discharge status: Home / Self Care | Payer: BC Managed Care – PPO | Source: Ambulatory Visit | Attending: Physician Assistant | Admitting: Physician Assistant

## 2019-04-30 ENCOUNTER — Other Ambulatory Visit: Payer: Self-pay

## 2019-04-30 VITALS — BP 106/72 | HR 77 | Ht 73.0 in | Wt 232.8 lb

## 2019-04-30 DIAGNOSIS — Z8249 Family history of ischemic heart disease and other diseases of the circulatory system: Secondary | ICD-10-CM | POA: Insufficient documentation

## 2019-04-30 DIAGNOSIS — I480 Paroxysmal atrial fibrillation: Secondary | ICD-10-CM

## 2019-04-30 DIAGNOSIS — Z7982 Long term (current) use of aspirin: Secondary | ICD-10-CM | POA: Insufficient documentation

## 2019-04-30 DIAGNOSIS — I10 Essential (primary) hypertension: Secondary | ICD-10-CM | POA: Diagnosis not present

## 2019-04-30 DIAGNOSIS — Z7901 Long term (current) use of anticoagulants: Secondary | ICD-10-CM | POA: Insufficient documentation

## 2019-04-30 DIAGNOSIS — Z86718 Personal history of other venous thrombosis and embolism: Secondary | ICD-10-CM | POA: Diagnosis not present

## 2019-04-30 DIAGNOSIS — E119 Type 2 diabetes mellitus without complications: Secondary | ICD-10-CM | POA: Diagnosis not present

## 2019-04-30 DIAGNOSIS — I2510 Atherosclerotic heart disease of native coronary artery without angina pectoris: Secondary | ICD-10-CM | POA: Insufficient documentation

## 2019-04-30 DIAGNOSIS — Z6830 Body mass index (BMI) 30.0-30.9, adult: Secondary | ICD-10-CM | POA: Diagnosis not present

## 2019-04-30 DIAGNOSIS — Z79899 Other long term (current) drug therapy: Secondary | ICD-10-CM | POA: Insufficient documentation

## 2019-04-30 DIAGNOSIS — D6869 Other thrombophilia: Secondary | ICD-10-CM | POA: Insufficient documentation

## 2019-04-30 DIAGNOSIS — E785 Hyperlipidemia, unspecified: Secondary | ICD-10-CM | POA: Diagnosis not present

## 2019-04-30 DIAGNOSIS — E669 Obesity, unspecified: Secondary | ICD-10-CM | POA: Diagnosis not present

## 2019-04-30 DIAGNOSIS — G4733 Obstructive sleep apnea (adult) (pediatric): Secondary | ICD-10-CM | POA: Diagnosis not present

## 2019-04-30 LAB — BASIC METABOLIC PANEL
Anion gap: 10 (ref 5–15)
BUN: 14 mg/dL (ref 8–23)
CO2: 21 mmol/L — ABNORMAL LOW (ref 22–32)
Calcium: 8.9 mg/dL (ref 8.9–10.3)
Chloride: 105 mmol/L (ref 98–111)
Creatinine, Ser: 1.13 mg/dL (ref 0.61–1.24)
GFR calc Af Amer: >60 mL/min (ref >60)
GFR calc non Af Amer: >60 mL/min (ref >60)
Glucose, Bld: 151 mg/dL — ABNORMAL HIGH (ref 70–99)
Potassium: 4.7 mmol/L (ref 3.5–5.1)
Sodium: 136 mmol/L (ref 135–145)

## 2019-04-30 LAB — MAGNESIUM: Magnesium: 2.3 mg/dL (ref 1.7–2.4)

## 2019-04-30 NOTE — Progress Notes (Signed)
Primary Care Physician: London Pepper, MD Primary Cardiologist: Dr Tamala Julian Primary Electrophysiologist: Dr Lovena Le Referring Physician: Dr Theresa Duty is a 69 y.o. male with a history of CAD (non-obstructive), HTN, HLD, DM, AFlutter (ablated 2003), AFib, DVT, OSA w/CPAP who presents for follow up in the [facility]. Patient is on Xarelto for a CHADS2VASC score of 4. Patient admitted for cardiac catheterization on 04/18/19 for progressive SOB. LHC showed diffuse coronary plaquing with a 70% narrowing in the RCA which is unchanged from 2018. Given patient's CAD, flecainide was stopped and patient was loaded on dofetilide. Patient reports that he has still had some fatigue since leaving the hospital but denies any heart racing or palpitations. He has an Visual merchandiser which has shown only SR.   Today, he denies symptoms of palpitations, chest pain, shortness of breath, orthopnea, PND, lower extremity edema, dizziness, presyncope, syncope, snoring, daytime somnolence, bleeding, or neurologic sequela. The patient is tolerating medications without difficulties and is otherwise without complaint today.    Atrial Fibrillation Risk Factors:  he does have symptoms or diagnosis of sleep apnea. he is compliant with CPAP therapy. he does not have a history of rheumatic fever.   he has a BMI of Body mass index is 30.71 kg/m.[name] Filed Weights   04/30/19 1141  Weight: 105.6 kg    Family History  Problem Relation Age of Onset  . Hypertension Mother 35  . Diabetes Mother   . COPD Mother   . Heart disease Mother   . Heart disease Father 30  . Hypertension Father   . Heart failure Father   . Heart attack Father   . Diabetes Brother 34  . Hypertension Brother 22  . Heart attack Paternal Grandfather   . Colon cancer Maternal Grandfather 66  . Stroke Neg Hx      Atrial Fibrillation Management history:  Previous antiarrhythmic drugs: flecainide,  dofetilide Previous cardioversions: none Previous ablations: 2003 flutter CHADS2VASC score: 4 Anticoagulation history: Xarelto   Past Medical History:  Diagnosis Date  . ACL tear    RIGHT KNEE  . Atrial fibrillation ([place]) 2012   Rare occurrences. On ASA & Flecainide prn  . Atrial flutter ([place])    s/p ablation 2008. On Flecainide prn.  . Broken leg 1968  . Concussion 1966   History of 4  . DVT (deep venous thrombosis) ([facility])    LEFT LOWER LEG  . Dyslipidemia   . Essential hypertension, benign   . Insomnia   . Low back pain   . Obesity (BMI 30-39.9)   . Obstructive sleep apnea on CPAP    Mild  . PAF (paroxysmal atrial fibrillation) ([place])    Rare occurrences. On ASA & Flecainide prn  . Pure hypercholesterolemia    On Lipitor   . Right shoulder pain    Intermittent  . Status post ablation of atrial flutter 2008  . Umbilical hernia    Past Surgical History:  Procedure Laterality Date  . ablation for atrial flutter  01/2002  . CARDIAC CATHETERIZATION  12/26/01  . CARDIAC CATHETERIZATION  04/18/2019  . COLONOSCOPY WITH PROPOFOL N/A 03/12/2016   Procedure: COLONOSCOPY WITH PROPOFOL;  Surgeon: [name], MD;  Location: [facility];  Service: Endoscopy;  Laterality: N/A;  . coronary angiography  1997   40% stenosis  . ESOPHAGOGASTRODUODENOSCOPY (EGD) WITH PROPOFOL N/A 10/15/2016   Procedure: ESOPHAGOGASTRODUODENOSCOPY (EGD) WITH PROPOFOL;  Surgeon: [name], MD;  Location: [facility];  Service: Endoscopy;  Laterality: N/A;  . LEFT HEART CATH AND CORONARY ANGIOGRAPHY N/A 02/11/2017   Procedure: LEFT HEART CATH AND CORONARY ANGIOGRAPHY;  Surgeon: Belva Crome, MD;  Location: [place] [facility];  Service: Cardiovascular;  Laterality: N/A;  . LEFT HEART CATH AND CORONARY ANGIOGRAPHY N/A 04/18/2019   Procedure: LEFT HEART CATH AND CORONARY ANGIOGRAPHY;  Surgeon: Belva Crome, MD;  Location: [place] [facility];  Service: Cardiovascular;  Laterality: N/A;  . right knee  surgery  1996   ACL, meniscus problems  . UMBILICAL HERNIA REPAIR  04/2015    Current Outpatient Medications  Medication Sig Dispense Refill  . acetaminophen (TYLENOL) 500 MG tablet Take 1,000 mg by mouth as needed for moderate pain or headache.     [name] amoxicillin-clavulanate (AUGMENTIN) 875-125 MG tablet Take 1 tablet by mouth every 12 (twelve) hours for 10 days. 20 tablet 0  . Ascorbic Acid (VITAMIN C) 1000 MG tablet Take 1,000 mg by mouth daily.    [name] aspirin EC 81 MG tablet Take 1 tablet (81 mg total) by mouth daily. 30 tablet 0  . atorvastatin (LIPITOR) 80 MG tablet Take 1 tablet (80 mg total) by mouth daily at 6 PM. 60 tablet 2  . cetirizine (ZYRTEC) 10 MG tablet Take 10 mg by mouth at bedtime as needed for allergies.    . Cholecalciferol (VITAMIN D) 50 MCG (2000 UT) tablet Take 2,000 Units by mouth daily.    [name] dofetilide (TIKOSYN) 500 MCG capsule Take 1 capsule (500 mcg total) by mouth 2 (two) times daily. 60 capsule 2  . glipiZIDE (GLUCOTROL XL) 2.5 MG 24 hr tablet Take 2.5 mg by mouth daily with breakfast.    . loperamide (IMODIUM A-D) 2 MG tablet Take 2 mg by mouth as needed for diarrhea or loose stools.     . Melatonin 5 MG CAPS Take 5 mg by mouth at bedtime as needed (sleep).    . Multiple Vitamins-Minerals (MULTIVITAMIN WITH MINERALS) tablet Take 1 tablet by mouth daily.      . nitroGLYCERIN (NITROSTAT) 0.4 MG SL tablet Place 1 tablet (0.4 mg total) under the tongue every 5 (five) minutes as needed. (Patient taking differently: Place 0.4 mg under the tongue every 5 (five) minutes as needed for chest pain. ) 25 tablet 3  . olmesartan (BENICAR) 20 MG tablet Take 20 mg by mouth daily.    . pantoprazole (PROTONIX) 40 MG tablet TAKE 1 TABLET BY MOUTH TWICE DAILY. (Patient taking differently: Take 40 mg by mouth 2 (two) times daily. ) 60 tablet 9  . potassium chloride SA (KLOR-CON) 20 MEQ tablet Take 1 tablet (20 mEq total) by mouth daily. 60 tablet 2  . rivaroxaban (XARELTO) 20 MG TABS  tablet TAKE 1 TABLET ONCE DAILY WITH DINNER. (Patient taking differently: Take 20 mg by mouth daily with supper. ) 30 tablet 5   No current facility-administered medications for this encounter.    Allergies  Allergen Reactions  . Metformin And Related Nausea And Vomiting    Dizzy and disoriented     Social History   Socioeconomic History  . Marital status: Married    Spouse name: Not on file  . Number of children: 2  . Years of education: Not on file  . Highest education level: Not on file  Occupational History  . Occupation: Scientist, clinical (histocompatibility and immunogenetics): [employer]  Tobacco Use  . Smoking status: Never Smoker  . Smokeless tobacco: Never Used  Substance and Sexual Activity  .  Alcohol use: Yes    Alcohol/week: 7.0 - 10.0 standard drinks    Types: 7 - 10 Glasses of wine per week  . Drug use: No  . Sexual activity: Not on file  Other Topics Concern  . Not on file  Social History Narrative   Caffeine: yes, coffee 2-3 cups daily, tea-rare.    Exercise-yes, 2-3X weekly, jog/run.    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    Marital Status: Married.    Children: 2 children-grown               Social Determinants of Health   Financial Resource Strain:   . Difficulty of Paying Living Expenses: Not on file  Food Insecurity:   . Worried About Charity fundraiser in the Last Year: Not on file  . Ran Out of Food in the Last Year: Not on file  Transportation Needs:   . Lack of Transportation (Medical): Not on file  . Lack of Transportation (Non-Medical): Not on file  Physical Activity:   . Days of Exercise per Week: Not on file  . Minutes of Exercise per Session: Not on file  Stress:   . Feeling of Stress : Not on file  Social Connections:   . Frequency of Communication with Friends and Family: Not on file  . Frequency of Social Gatherings with Friends and Family: Not on file  . Attends Religious Services: Not  on file  . Active Member of Clubs or Organizations: Not on file  . Attends Archivist Meetings: Not on file  . Marital Status: Not on file  Intimate Partner Violence:   . Fear of Current or Ex-Partner: Not on file  . Emotionally Abused: Not on file  . Physically Abused: Not on file  . Sexually Abused: Not on file     ROS- All systems are reviewed and negative except as per the HPI above.  Physical Exam: Vitals:   04/30/19 1141  BP: 106/72  Pulse: 77  Weight: 105.6 kg  Height: 6\' 1"  (1.854 m)    GEN- The patient is well appearing obese male, alert and oriented x 3 today.   Head- normocephalic, atraumatic Eyes-  Sclera clear, conjunctiva pink Ears- hearing intact Oropharynx- clear Neck- supple  Lungs- Clear to ausculation bilaterally, normal work of breathing Heart- Regular rate and rhythm, no murmurs, rubs or gallops  GI- soft, NT, ND, + BS Extremities- no clubbing, cyanosis, or edema MS- no significant deformity or atrophy Skin- no rash or lesion Psych- euthymic mood, full affect Neuro- strength and sensation are intact  Wt Readings from Last 3 Encounters:  04/30/19 105.6 kg  04/22/19 103.4 kg  04/17/19 105.2 kg    EKG today demonstrates SR HR 77, 1st degree AV block, PR 238, QRS 102, QTc 450  Echo 04/18/19 demonstrated  1. Left ventricular ejection fraction, by visual estimation, is 50 to  55%. The left ventricle has normal function. There is no left ventricular  hypertrophy.  2. Definity contrast agent was given IV to delineate the left ventricular  endocardial borders.  3. The left ventricle has no regional wall motion abnormalities.  4. Left ventricular diastolic parameters are indeterminate.  5. Global right ventricle has normal systolic function.The right  ventricular size is not well visualized. Right vetricular wall thickness  was not assessed.  6. Left atrial size was normal.  7. Right atrial size was normal.  8. The mitral valve is  normal in structure. No evidence of mitral valve  regurgitation. No evidence of mitral stenosis.  9. The tricuspid valve is normal in structure. Tricuspid valve  regurgitation is trivial.  10. The aortic valve is grossly normal. Aortic valve regurgitation is not  visualized. No evidence of aortic valve sclerosis or stenosis.  11. The pulmonic valve was normal in structure. Pulmonic valve  regurgitation is trivial.  12. The inferior vena cava is normal in size with greater than 50%  respiratory variability, suggesting right atrial pressure of 3 mmHg.  13. TR signal is inadequate for assessing pulmonary artery systolic  pressure.   Epic records are reviewed at length today  CHA2DS2-VASc Score = 4 The patient's score is based upon: CHF History: No HTN History: Yes Age : [age] Diabetes History: Yes Stroke History: No Vascular Disease History: Yes Gender: Male      ASSESSMENT AND PLAN: 1. Paroxysmal Atrial Fibrillation (ICD10:  I48.0) The patient's CHA2DS2-VASc score is 4, indicating a 4.8% annual risk of stroke.   S/p dofetilide loading 2/4-04/22/19. Patient appears to be maintaining SR. Continue dofetilide 500 mcg BID. QT stable. Check bmet/mag today. Continue Xarelto 20 mg daily.  2. Secondary Hypercoagulable State (ICD10:  D68.69) The patient is at significant risk for stroke/thromboembolism based upon his CHA2DS2-VASc Score of 4.  Continue Rivaroxaban (Xarelto).   3. Obesity Body mass index is 30.71 kg/m. Lifestyle modification was discussed at length including regular exercise and weight reduction. Patient plans to slowly increase his activity level.  4. Obstructive sleep apnea The importance of adequate treatment of sleep apnea was discussed today in order to improve our ability to maintain sinus rhythm long term. Patient compliant with CPAP therapy.  5. CAD No anginal symptoms. Followed by Dr Tamala Julian.  6. HTN Stable, no changes today.   Follow up with Dr  Lovena Le as scheduled. AF clinic in 4 months.    [facility] [address] [place], [place] [address] [phone] 04/30/2019 12:58 PM

## 2019-05-02 ENCOUNTER — Inpatient Hospital Stay (HOSPITAL_COMMUNITY)
Admission: EM | Admit: 2019-05-02 | Discharge: 2019-05-05 | DRG: 378 | Disposition: A | Discharge status: Home / Self Care | Payer: BC Managed Care – PPO | Attending: Internal Medicine | Admitting: Internal Medicine

## 2019-05-02 ENCOUNTER — Encounter (HOSPITAL_COMMUNITY): Payer: Self-pay

## 2019-05-02 ENCOUNTER — Other Ambulatory Visit: Payer: Self-pay

## 2019-05-02 DIAGNOSIS — I480 Paroxysmal atrial fibrillation: Secondary | ICD-10-CM

## 2019-05-02 DIAGNOSIS — I2510 Atherosclerotic heart disease of native coronary artery without angina pectoris: Secondary | ICD-10-CM | POA: Diagnosis not present

## 2019-05-02 DIAGNOSIS — K5730 Diverticulosis of large intestine without perforation or abscess without bleeding: Secondary | ICD-10-CM | POA: Diagnosis not present

## 2019-05-02 DIAGNOSIS — E7800 Pure hypercholesterolemia, unspecified: Secondary | ICD-10-CM | POA: Diagnosis present

## 2019-05-02 DIAGNOSIS — E785 Hyperlipidemia, unspecified: Secondary | ICD-10-CM | POA: Diagnosis present

## 2019-05-02 DIAGNOSIS — K922 Gastrointestinal hemorrhage, unspecified: Secondary | ICD-10-CM | POA: Diagnosis not present

## 2019-05-02 DIAGNOSIS — Z79899 Other long term (current) drug therapy: Secondary | ICD-10-CM | POA: Diagnosis not present

## 2019-05-02 DIAGNOSIS — K8020 Calculus of gallbladder without cholecystitis without obstruction: Secondary | ICD-10-CM | POA: Diagnosis not present

## 2019-05-02 DIAGNOSIS — I25110 Atherosclerotic heart disease of native coronary artery with unstable angina pectoris: Secondary | ICD-10-CM | POA: Diagnosis not present

## 2019-05-02 DIAGNOSIS — E119 Type 2 diabetes mellitus without complications: Secondary | ICD-10-CM | POA: Diagnosis not present

## 2019-05-02 DIAGNOSIS — K625 Hemorrhage of anus and rectum: Secondary | ICD-10-CM | POA: Diagnosis not present

## 2019-05-02 DIAGNOSIS — K5731 Diverticulosis of large intestine without perforation or abscess with bleeding: Secondary | ICD-10-CM | POA: Diagnosis not present

## 2019-05-02 DIAGNOSIS — Z7901 Long term (current) use of anticoagulants: Secondary | ICD-10-CM

## 2019-05-02 DIAGNOSIS — D62 Acute posthemorrhagic anemia: Secondary | ICD-10-CM | POA: Diagnosis not present

## 2019-05-02 DIAGNOSIS — D509 Iron deficiency anemia, unspecified: Secondary | ICD-10-CM | POA: Diagnosis not present

## 2019-05-02 DIAGNOSIS — K5733 Diverticulitis of large intestine without perforation or abscess with bleeding: Secondary | ICD-10-CM | POA: Diagnosis not present

## 2019-05-02 DIAGNOSIS — Z7982 Long term (current) use of aspirin: Secondary | ICD-10-CM | POA: Diagnosis not present

## 2019-05-02 DIAGNOSIS — Z20822 Contact with and (suspected) exposure to covid-19: Secondary | ICD-10-CM | POA: Diagnosis not present

## 2019-05-02 DIAGNOSIS — Z86711 Personal history of pulmonary embolism: Secondary | ICD-10-CM

## 2019-05-02 DIAGNOSIS — Z87820 Personal history of traumatic brain injury: Secondary | ICD-10-CM

## 2019-05-02 DIAGNOSIS — K921 Melena: Secondary | ICD-10-CM | POA: Diagnosis not present

## 2019-05-02 DIAGNOSIS — R509 Fever, unspecified: Secondary | ICD-10-CM | POA: Diagnosis not present

## 2019-05-02 DIAGNOSIS — I10 Essential (primary) hypertension: Secondary | ICD-10-CM | POA: Diagnosis not present

## 2019-05-02 DIAGNOSIS — Z7984 Long term (current) use of oral hypoglycemic drugs: Secondary | ICD-10-CM | POA: Diagnosis not present

## 2019-05-02 DIAGNOSIS — Z8249 Family history of ischemic heart disease and other diseases of the circulatory system: Secondary | ICD-10-CM

## 2019-05-02 DIAGNOSIS — R7989 Other specified abnormal findings of blood chemistry: Secondary | ICD-10-CM | POA: Diagnosis not present

## 2019-05-02 DIAGNOSIS — K635 Polyp of colon: Secondary | ICD-10-CM | POA: Diagnosis present

## 2019-05-02 DIAGNOSIS — Z86718 Personal history of other venous thrombosis and embolism: Secondary | ICD-10-CM | POA: Diagnosis not present

## 2019-05-02 DIAGNOSIS — K7460 Unspecified cirrhosis of liver: Secondary | ICD-10-CM | POA: Diagnosis not present

## 2019-05-02 DIAGNOSIS — G4700 Insomnia, unspecified: Secondary | ICD-10-CM | POA: Diagnosis not present

## 2019-05-02 DIAGNOSIS — I4891 Unspecified atrial fibrillation: Secondary | ICD-10-CM | POA: Diagnosis present

## 2019-05-02 DIAGNOSIS — Z888 Allergy status to other drugs, medicaments and biological substances status: Secondary | ICD-10-CM | POA: Diagnosis not present

## 2019-05-02 DIAGNOSIS — K5791 Diverticulosis of intestine, part unspecified, without perforation or abscess with bleeding: Secondary | ICD-10-CM | POA: Diagnosis present

## 2019-05-02 DIAGNOSIS — G4733 Obstructive sleep apnea (adult) (pediatric): Secondary | ICD-10-CM | POA: Diagnosis not present

## 2019-05-02 DIAGNOSIS — R161 Splenomegaly, not elsewhere classified: Secondary | ICD-10-CM | POA: Diagnosis present

## 2019-05-02 DIAGNOSIS — R945 Abnormal results of liver function studies: Secondary | ICD-10-CM | POA: Diagnosis not present

## 2019-05-02 DIAGNOSIS — D123 Benign neoplasm of transverse colon: Secondary | ICD-10-CM | POA: Diagnosis not present

## 2019-05-02 LAB — RESPIRATORY PANEL BY RT PCR (FLU A&B, COVID)
Influenza A by PCR: NEGATIVE
Influenza B by PCR: NEGATIVE
SARS Coronavirus 2 by RT PCR: NEGATIVE

## 2019-05-02 LAB — CBC
HCT: 42.5 % (ref 39.0–52.0)
Hemoglobin: 13.7 g/dL (ref 13.0–17.0)
MCH: 29.6 pg (ref 26.0–34.0)
MCHC: 32.2 g/dL (ref 30.0–36.0)
MCV: 91.8 fL (ref 80.0–100.0)
Platelets: 194 10*3/uL (ref 150–400)
RBC: 4.63 MIL/uL (ref 4.22–5.81)
RDW: 14.8 % (ref 11.5–15.5)
WBC: 8 10*3/uL (ref 4.0–10.5)
nRBC: 0 % (ref 0.0–0.2)

## 2019-05-02 LAB — COMPREHENSIVE METABOLIC PANEL
ALT: 95 U/L — ABNORMAL HIGH (ref 0–44)
AST: 62 U/L — ABNORMAL HIGH (ref 15–41)
Albumin: 3.6 g/dL (ref 3.5–5.0)
Alkaline Phosphatase: 89 U/L (ref 38–126)
Anion gap: 8 (ref 5–15)
BUN: 15 mg/dL (ref 8–23)
CO2: 23 mmol/L (ref 22–32)
Calcium: 9.2 mg/dL (ref 8.9–10.3)
Chloride: 105 mmol/L (ref 98–111)
Creatinine, Ser: 1.06 mg/dL (ref 0.61–1.24)
GFR calc Af Amer: >60 mL/min (ref >60)
GFR calc non Af Amer: >60 mL/min (ref >60)
Glucose, Bld: 111 mg/dL — ABNORMAL HIGH (ref 70–99)
Potassium: 4.4 mmol/L (ref 3.5–5.1)
Sodium: 136 mmol/L (ref 135–145)
Total Bilirubin: 1.1 mg/dL (ref 0.3–1.2)
Total Protein: 6.4 g/dL — ABNORMAL LOW (ref 6.5–8.1)

## 2019-05-02 LAB — POC OCCULT BLOOD, ED: Fecal Occult Bld: POSITIVE — AB

## 2019-05-02 LAB — CBG MONITORING, ED: Glucose-Capillary: 102 mg/dL — ABNORMAL HIGH (ref 70–99)

## 2019-05-02 LAB — TYPE AND SCREEN
ABO/RH(D): A POS
Antibody Screen: NEGATIVE

## 2019-05-02 LAB — ABO/RH: ABO/RH(D): A POS

## 2019-05-02 MED ORDER — ADULT MULTIVITAMIN W/MINERALS CH
1.0000 | ORAL_TABLET | Freq: Every day | ORAL | Status: DC
  Administered 2019-05-02 – 2019-05-05 (×4): 1 via ORAL
  Filled 2019-05-02 (×4): qty 1

## 2019-05-02 MED ORDER — ONDANSETRON HCL 4 MG PO TABS: 4.0000 mg | ORAL_TABLET | Freq: Four times a day (QID) | ORAL | Status: DC | PRN

## 2019-05-02 MED ORDER — ONDANSETRON HCL 4 MG/2ML IJ SOLN
4.0000 mg | Freq: Four times a day (QID) | INTRAMUSCULAR | Status: DC | PRN
  Administered 2019-05-04: 4 mg via INTRAVENOUS
  Filled 2019-05-02: qty 2

## 2019-05-02 MED ORDER — ATORVASTATIN CALCIUM 80 MG PO TABS: 80.0000 mg | ORAL_TABLET | Freq: Every day | ORAL | Status: DC

## 2019-05-02 MED ORDER — MELATONIN 3 MG PO TABS
6.0000 mg | ORAL_TABLET | Freq: Every evening | ORAL | Status: DC | PRN
  Administered 2019-05-03: 6 mg via ORAL
  Filled 2019-05-02: qty 2

## 2019-05-02 MED ORDER — VITAMIN D 25 MCG (1000 UNIT) PO TABS
2000.0000 [IU] | ORAL_TABLET | Freq: Every day | ORAL | Status: DC
  Administered 2019-05-02 – 2019-05-05 (×4): 2000 [IU] via ORAL
  Filled 2019-05-02 (×4): qty 2

## 2019-05-02 MED ORDER — INSULIN ASPART 100 UNIT/ML ~~LOC~~ SOLN
0.0000 [IU] | Freq: Three times a day (TID) | SUBCUTANEOUS | Status: DC
  Administered 2019-05-03: 1 [IU] via SUBCUTANEOUS
  Administered 2019-05-03: 12:00:00 2 [IU] via SUBCUTANEOUS
  Administered 2019-05-04 – 2019-05-05 (×4): 1 [IU] via SUBCUTANEOUS

## 2019-05-02 MED ORDER — PANTOPRAZOLE SODIUM 40 MG PO TBEC
40.0000 mg | DELAYED_RELEASE_TABLET | Freq: Two times a day (BID) | ORAL | Status: DC
  Administered 2019-05-02 – 2019-05-05 (×6): 40 mg via ORAL
  Filled 2019-05-02 (×6): qty 1

## 2019-05-02 MED ORDER — DOFETILIDE 500 MCG PO CAPS
500.0000 ug | ORAL_CAPSULE | Freq: Two times a day (BID) | ORAL | Status: DC
  Administered 2019-05-03 – 2019-05-05 (×5): 500 ug via ORAL
  Filled 2019-05-02 (×8): qty 1

## 2019-05-02 MED ORDER — ACETAMINOPHEN 650 MG RE SUPP: 650.0000 mg | Freq: Four times a day (QID) | RECTAL | Status: DC | PRN

## 2019-05-02 MED ORDER — ATORVASTATIN CALCIUM 80 MG PO TABS
80.0000 mg | ORAL_TABLET | Freq: Every day | ORAL | Status: DC
  Administered 2019-05-03 – 2019-05-04 (×2): 80 mg via ORAL
  Filled 2019-05-02 (×3): qty 1

## 2019-05-02 MED ORDER — ACETAMINOPHEN 325 MG PO TABS: 650.0000 mg | ORAL_TABLET | Freq: Four times a day (QID) | ORAL | Status: DC | PRN

## 2019-05-02 MED ORDER — INSULIN ASPART 100 UNIT/ML ~~LOC~~ SOLN: 0.0000 [IU] | Freq: Every day | SUBCUTANEOUS | Status: DC

## 2019-05-02 NOTE — ED Provider Notes (Signed)
This patient presents with multiple episodes of gastrointestinal bleeding.  3 episodes prearrival and 2 episodes after arrival.  Bright red blood, he has anticoagulated for history of DVT and A. fib.  Recently had heart catheterization, at this time patient has a nontender abdomen, increased bowel sounds, cardiac and pulmonary exams are unremarkable, blood pressure is normal, patient will need to be admitted for gastrointestinal bleeding.  Medical screening examination/treatment/procedure(s) were conducted as a shared visit with non-physician practitioner(s) and myself.  I personally evaluated the patient during the encounter.  Clinical Impression:   Final diagnoses:  Lower GI bleed  Elevated LFTs         Noemi Chapel, MD 05/04/19 1325

## 2019-05-02 NOTE — ED Triage Notes (Signed)
Pt arrives POV for eval of GI bleeding. Pt reports that he was recently treated on abx for possible diverticular flare, pt reports he completed those abx this AM. Had 3 bloody bowel movements this AM. Pt is currently anticoagulated on xarelto for his afib. Denies hematemesis, hemoptysis.

## 2019-05-02 NOTE — ED Provider Notes (Signed)
[facility] EMERGENCY DEPARTMENT Provider Note   CSN: [id number] Arrival date & time: 05/02/19  1321     History Chief Complaint  Patient presents with  . GI Bleeding    Carlos Gutierrez is a 69 y.o. male who presents with GI bleed.  Patient states that he was recently admitted in the hospital for shortness of breath and uncontrolled A. fib.  He underwent cardiac cath which showed nonobstructive disease and medical management was recommended.  His antiarrhythmic was changed from flecainide to Tikosyn.  He also developed a fever of 102 and left lower quadrant abdominal pain.  CT scan was performed which did not show anything acute but he was clinically treated for diverticulitis with Augmentin.  COVID was negative. His last dose of antibiotic was today.  He states the fever and abdominal pain are improving however this morning he started to have bloody bowel movements.  He said the bowel movements were initially mixed with loose stool but have become increasingly bloody and are bright red.  He takes Xarelto and a baby aspirin daily.  His last dose was last night. No chest pain, SOB, lightheadedness/syncope. He called his doctor and they recommended ED evaluation. He had a colonoscopy in 2017 by Dr. Benson Norway which showed sigmoid diverticulosis and benign polyps.  HPI     Past Medical History:  Diagnosis Date  . ACL tear    RIGHT KNEE  . Atrial fibrillation ([place]) 2012   Rare occurrences. On ASA & Flecainide prn  . Atrial flutter ([place])    s/p ablation 2008. On Flecainide prn.  . Broken leg 1968  . Concussion 1966   History of 4  . DVT (deep venous thrombosis) (HCC)    LEFT LOWER LEG  . Dyslipidemia   . Essential hypertension, benign   . Insomnia   . Low back pain   . Obesity (BMI 30-39.9)   . Obstructive sleep apnea on CPAP    Mild  . PAF (paroxysmal atrial fibrillation) ([place])    Rare occurrences. On ASA & Flecainide prn  . Pure hypercholesterolemia    On Lipitor    . Right shoulder pain    Intermittent  . Status post ablation of atrial flutter 2008  . Umbilical hernia     Patient Active Problem List   Diagnosis Date Noted  . Secondary hypercoagulable state ([place]) 04/30/2019  . Abdominal pain, LLQ (left lower quadrant) 04/21/2019  . Coronary artery disease involving native coronary artery of native heart with unstable angina pectoris ([place]) 04/17/2019  . Type 2 diabetes mellitus without complication, without long-term current use of insulin ([place]) 04/17/2019  . Abnormal nuclear cardiac imaging test 02/10/2017  . Chest pain 09/21/2016  . Pulmonary embolus ([place])   . Umbilical hernia [id number]  . Atrial flutter ([place]) 09/28/2013  . Encounter for long-term (current) use of other medications 09/28/2013  . Insomnia 04/13/2013  . Obstructive sleep apnea on CPAP   . HTN (hypertension)   . Obesity (BMI 30-39.9)   . Atrial fibrillation ([place]) 01/21/2011    Past Surgical History:  Procedure Laterality Date  . ablation for atrial flutter  01/2002  . CARDIAC CATHETERIZATION  12/26/01  . CARDIAC CATHETERIZATION  04/18/2019  . COLONOSCOPY WITH PROPOFOL N/A 03/12/2016   Procedure: COLONOSCOPY WITH PROPOFOL;  Surgeon: [name], MD;  Location: [facility];  Service: Endoscopy;  Laterality: N/A;  . coronary angiography  1997   40% stenosis  . ESOPHAGOGASTRODUODENOSCOPY (EGD) WITH PROPOFOL N/A 10/15/2016   Procedure:  ESOPHAGOGASTRODUODENOSCOPY (EGD) WITH PROPOFOL;  Surgeon: [name], MD;  [facility];  Service: Endoscopy;  Laterality: N/A;  . LEFT HEART CATH AND CORONARY ANGIOGRAPHY N/A 02/11/2017   Procedure: LEFT HEART CATH AND CORONARY ANGIOGRAPHY;  Surgeon: Belva Crome, MD;  Location: [facility] CV LAB;  Service: Cardiovascular;  Laterality: N/A;  . LEFT HEART CATH AND CORONARY ANGIOGRAPHY N/A 04/18/2019   Procedure: LEFT HEART CATH AND CORONARY ANGIOGRAPHY;  Surgeon: Belva Crome, MD;  Location: [facility] LAB;  Service:  Cardiovascular;  Laterality: N/A;  . right knee surgery  1996   ACL, meniscus problems  . UMBILICAL HERNIA REPAIR  04/2015       Family History  Problem Relation Age of Onset  . Hypertension Mother 68  . Diabetes Mother   . COPD Mother   . Heart disease Mother   . Heart disease Father 55  . Hypertension Father   . Heart failure Father   . Heart attack Father   . Diabetes Brother 74  . Hypertension Brother 96  . Heart attack Paternal Grandfather   . Colon cancer Maternal Grandfather 65  . Stroke Neg Hx     Social History   Tobacco Use  . Smoking status: Never Smoker  . Smokeless tobacco: Never Used  Substance Use Topics  . Alcohol use: Yes    Alcohol/week: 7.0 - 10.0 standard drinks    Types: 7 - 10 Glasses of wine per week  . Drug use: No    Home Medications Prior to Admission medications   Medication Sig Start Date End Date Taking? Authorizing Provider  acetaminophen (TYLENOL) 500 MG tablet Take 1,000 mg by mouth as needed for moderate pain or headache.     [provider]  amoxicillin-clavulanate (AUGMENTIN) 875-125 MG tablet Take 1 tablet by mouth every 12 (twelve) hours for 10 days. 04/22/19 05/02/19  Kathyrn Drown D, NP  Ascorbic Acid (VITAMIN C) 1000 MG tablet Take 1,000 mg by mouth daily.    [provider]  aspirin EC 81 MG tablet Take 1 tablet (81 mg total) by mouth daily. 09/22/16   Hosie Poisson, MD  atorvastatin (LIPITOR) 80 MG tablet Take 1 tablet (80 mg total) by mouth daily at 6 PM. 04/22/19   Tommie Raymond, NP  cetirizine (ZYRTEC) 10 MG tablet Take 10 mg by mouth at bedtime as needed for allergies.    [provider]  Cholecalciferol (VITAMIN D) 50 MCG (2000 UT) tablet Take 2,000 Units by mouth daily.    [provider]  dofetilide (TIKOSYN) 500 MCG capsule Take 1 capsule (500 mcg total) by mouth 2 (two) times daily. 04/22/19   Tommie Raymond, NP  glipiZIDE (GLUCOTROL XL) 2.5 MG 24 hr tablet Take 2.5 mg by mouth daily  with breakfast.    [provider]  loperamide (IMODIUM A-D) 2 MG tablet Take 2 mg by mouth as needed for diarrhea or loose stools.     [provider]  Melatonin 5 MG CAPS Take 5 mg by mouth at bedtime as needed (sleep).    [provider]  Multiple Vitamins-Minerals (MULTIVITAMIN WITH MINERALS) tablet Take 1 tablet by mouth daily.      [provider]  nitroGLYCERIN (NITROSTAT) 0.4 MG SL tablet Place 1 tablet (0.4 mg total) under the tongue every 5 (five) minutes as needed. Patient taking differently: Place 0.4 mg under the tongue every 5 (five) minutes as needed for chest pain.  03/04/17 04/30/19  Cecilie Kicks  R, NP  olmesartan (BENICAR) 20 MG tablet Take 20 mg by mouth daily. 04/22/19   [provider]  pantoprazole (PROTONIX) 40 MG tablet TAKE 1 TABLET BY MOUTH TWICE DAILY. Patient taking differently: Take 40 mg by mouth 2 (two) times daily.  12/26/18   Belva Crome, MD  potassium chloride SA (KLOR-CON) 20 MEQ tablet Take 1 tablet (20 mEq total) by mouth daily. 04/22/19   Tommie Raymond, NP  rivaroxaban (XARELTO) 20 MG TABS tablet TAKE 1 TABLET ONCE DAILY WITH DINNER. Patient taking differently: Take 20 mg by mouth daily with supper.  11/22/18   Richardson Dopp T, PA-C    Allergies    Metformin and related  Review of Systems   Review of Systems  Constitutional: Positive for fever (resolved). Negative for chills.  Respiratory: Negative for cough and shortness of breath.   Cardiovascular: Negative for chest pain.  Gastrointestinal: Positive for abdominal pain. Negative for diarrhea, nausea and vomiting.       +loose stool +hematochezia  Genitourinary: Negative for dysuria and flank pain.  Neurological: Negative for syncope and light-headedness.  Hematological: Bruises/bleeds easily.  All other systems reviewed and are negative.   Physical Exam Updated Vital Signs BP 118/79 (BP Location: Right Arm)   Pulse 80   Temp 98.2 F (36.8 C)  (Oral)   Resp 18   Ht 6\' 1"  (1.854 m)   Wt 105.2 kg   SpO2 100%   BMI 30.61 kg/m   Physical Exam Vitals and nursing note reviewed.  Constitutional:      General: He is not in acute distress.    Appearance: Normal appearance. He is well-developed. He is not ill-appearing.  HENT:     Head: Normocephalic and atraumatic.  Eyes:     General: No scleral icterus.       Right eye: No discharge.        Left eye: No discharge.     Conjunctiva/sclera: Conjunctivae normal.     Pupils: Pupils are equal, round, and reactive to light.  Cardiovascular:     Rate and Rhythm: Normal rate and regular rhythm.  Pulmonary:     Effort: Pulmonary effort is normal. No respiratory distress.     Breath sounds: Normal breath sounds.  Abdominal:     General: There is no distension.     Palpations: Abdomen is soft.     Tenderness: There is no abdominal tenderness.  Genitourinary:    Comments: Rectal: Performed by nursing - reportedly no gross blood or melena Musculoskeletal:     Cervical back: Normal range of motion.  Skin:    General: Skin is warm and dry.  Neurological:     Mental Status: He is alert and oriented to person, place, and time.  Psychiatric:        Behavior: Behavior normal.     ED Results / Procedures / Treatments   Labs (all labs ordered are listed, but only abnormal results are displayed) Labs Reviewed  COMPREHENSIVE METABOLIC PANEL - Abnormal; Notable for the following components:      Result Value   Glucose, Bld 111 (*)    Total Protein 6.4 (*)    AST 62 (*)    ALT 95 (*)    All other components within normal limits  POC OCCULT BLOOD, ED - Abnormal; Notable for the following components:   Fecal Occult Bld POSITIVE (*)    All other components within normal limits  RESPIRATORY PANEL BY RT PCR (FLU A&B, COVID)  CBC  CBC  COMPREHENSIVE METABOLIC PANEL  TYPE AND SCREEN  ABO/RH    EKG None  Radiology No results found.  Procedures Procedures (including critical  care time)  Medications Ordered in ED Medications  atorvastatin (LIPITOR) tablet 80 mg (has no administration in time range)  dofetilide (TIKOSYN) capsule 500 mcg (has no administration in time range)  Melatonin CAPS 5 mg (has no administration in time range)  pantoprazole (PROTONIX) EC tablet 40 mg (has no administration in time range)  multivitamin with minerals tablet 1 tablet (has no administration in time range)  cholecalciferol (VITAMIN D3) tablet 2,000 Units (has no administration in time range)  acetaminophen (TYLENOL) tablet 650 mg (has no administration in time range)    Or  acetaminophen (TYLENOL) suppository 650 mg (has no administration in time range)  ondansetron (ZOFRAN) tablet 4 mg (has no administration in time range)    Or  ondansetron (ZOFRAN) injection 4 mg (has no administration in time range)    ED Course  I have reviewed the triage vital signs and the nursing notes.  Pertinent labs & imaging results that were available during my care of the patient were reviewed by me and considered in my medical decision making (see chart for details).  69 year old male presents with symptoms concerning for acute lower GI bleed. Vitals are normal here. His vitals are normal. Pt reports 3 blood BMs at home and has had 2 additional ones here. Abdomen is soft, non-tender. Recent CT scan was negative for diverticulitis but showed cirrhosis, splenomegaly, cholelithiasis, prostatomegaly. Pt's last dose of blood thinner was last night. Labs show stable hgb (13.7). CMP shows mildly elevated LFTs which is likely from his liver disease. Hemoccult was done by nursing and was positive. Shared visit with Dr. Sabra Heck. Will discuss with Gi.  Discussed with Dr. Collene Mares who will see in consult. Discussed with Dr. Alcario Drought with [facility] who will admit for lower GI bleed.  MDM Rules/Calculators/A&P                       Final Clinical Impression(s) / ED Diagnoses Final diagnoses:  Lower GI bleed    Elevated LFTs    Rx / DC Orders ED Discharge Orders    None       Recardo Evangelist, PA-C 05/02/19 1932    Noemi Chapel, MD 05/04/19 1325

## 2019-05-02 NOTE — ED Notes (Signed)
RN to RN report on 6N with no questions or concerns.

## 2019-05-02 NOTE — H&P (Addendum)
History and Physical    Carlos Gutierrez [id number] DOB: 1950/12/22 DOA: 05/02/2019  PCP: London Pepper, MD  Patient coming from: Home  I have personally briefly reviewed patient's old medical records in [place]  Chief Complaint: Hematochezia  HPI: Carlos Gutierrez is a 69 y.o. male with medical history significant of A.Fib on Xarelto, non-occlusive CAD.  Patient presents to ED after 3 bloody BMs this morning.  Recently treated with course of amoxicillin empirically for diverticulitis diagnosed clinically (not seen on CT), finished ABx this morning.  Abd pain and fever improved with ABx, but bleeding started this morning.  No CP, SOB, syncope, lightheadedness.   ED Course: HGB nl, EDP wants obs for diverticular bleed.   Review of Systems: As per HPI, otherwise all review of systems negative.  Past Medical History:  Diagnosis Date  . ACL tear    RIGHT KNEE  . Atrial fibrillation ([place]) 2012   Rare occurrences. On ASA & Flecainide prn  . Atrial flutter ([place])    s/p ablation 2008. On Flecainide prn.  . Broken leg 1968  . Concussion 1966   History of 4  . DVT (deep venous thrombosis) (HCC)    LEFT LOWER LEG  . Dyslipidemia   . Essential hypertension, benign   . Insomnia   . Low back pain   . Obesity (BMI 30-39.9)   . Obstructive sleep apnea on CPAP    Mild  . PAF (paroxysmal atrial fibrillation) ([place])    Rare occurrences. On ASA & Flecainide prn  . Pure hypercholesterolemia    On Lipitor   . Right shoulder pain    Intermittent  . Status post ablation of atrial flutter 2008  . Umbilical hernia     Past Surgical History:  Procedure Laterality Date  . ablation for atrial flutter  01/2002  . CARDIAC CATHETERIZATION  12/26/01  . CARDIAC CATHETERIZATION  04/18/2019  . COLONOSCOPY WITH PROPOFOL N/A 03/12/2016   Procedure: COLONOSCOPY WITH PROPOFOL;  Surgeon: [name], MD;  [facility];  Service: Endoscopy;  Laterality: N/A;  . coronary  angiography  1997   40% stenosis  . ESOPHAGOGASTRODUODENOSCOPY (EGD) WITH PROPOFOL N/A 10/15/2016   Procedure: ESOPHAGOGASTRODUODENOSCOPY (EGD) WITH PROPOFOL;  Surgeon: [name], MD;  [facility];  Service: Endoscopy;  Laterality: N/A;  . LEFT HEART CATH AND CORONARY ANGIOGRAPHY N/A 02/11/2017   Procedure: LEFT HEART CATH AND CORONARY ANGIOGRAPHY;  Surgeon: Belva Crome, MD;  Location: [facility] LAB;  Service: Cardiovascular;  Laterality: N/A;  . LEFT HEART CATH AND CORONARY ANGIOGRAPHY N/A 04/18/2019   Procedure: LEFT HEART CATH AND CORONARY ANGIOGRAPHY;  Surgeon: Belva Crome, MD;  Location: [facility];  Service: Cardiovascular;  Laterality: N/A;  . right knee surgery  1996   ACL, meniscus problems  . UMBILICAL HERNIA REPAIR  04/2015     reports that he has never smoked. He has never used smokeless tobacco. He reports current alcohol use of about 7.0 - 10.0 standard drinks of alcohol per week. He reports that he does not use drugs.  Allergies  Allergen Reactions  . Metformin And Related Nausea And Vomiting    Dizzy and disoriented     Family History  Problem Relation Age of Onset  . Hypertension Mother 71  . Diabetes Mother   . COPD Mother   . Heart disease Mother   . Heart disease Father 73  . Hypertension Father   . Heart failure Father   .  Heart attack Father   . Diabetes Brother 87  . Hypertension Brother 44  . Heart attack Paternal Grandfather   . Colon cancer Maternal Grandfather 30  . Stroke Neg Hx      Prior to Admission medications   Medication Sig Start Date End Date Taking? Authorizing Provider  acetaminophen (TYLENOL) 500 MG tablet Take 1,000 mg by mouth as needed for moderate pain or headache.     [provider]  Ascorbic Acid (VITAMIN C) 1000 MG tablet Take 1,000 mg by mouth daily.    [provider]  aspirin EC 81 MG tablet Take 1 tablet (81 mg total) by mouth daily. 09/22/16   Hosie Poisson, MD  atorvastatin  (LIPITOR) 80 MG tablet Take 1 tablet (80 mg total) by mouth daily at 6 PM. 04/22/19   Tommie Raymond, NP  cetirizine (ZYRTEC) 10 MG tablet Take 10 mg by mouth at bedtime as needed for allergies.    [provider]  Cholecalciferol (VITAMIN D) 50 MCG (2000 UT) tablet Take 2,000 Units by mouth daily.    [provider]  dofetilide (TIKOSYN) 500 MCG capsule Take 1 capsule (500 mcg total) by mouth 2 (two) times daily. 04/22/19   Tommie Raymond, NP  glipiZIDE (GLUCOTROL XL) 2.5 MG 24 hr tablet Take 2.5 mg by mouth daily with breakfast.    [provider]  loperamide (IMODIUM A-D) 2 MG tablet Take 2 mg by mouth as needed for diarrhea or loose stools.     [provider]  Melatonin 5 MG CAPS Take 5 mg by mouth at bedtime as needed (sleep).    [provider]  Multiple Vitamins-Minerals (MULTIVITAMIN WITH MINERALS) tablet Take 1 tablet by mouth daily.      [provider]  nitroGLYCERIN (NITROSTAT) 0.4 MG SL tablet Place 1 tablet (0.4 mg total) under the tongue every 5 (five) minutes as needed. Patient taking differently: Place 0.4 mg under the tongue every 5 (five) minutes as needed for chest pain.  03/04/17 04/30/19  Isaiah Serge, NP  olmesartan (BENICAR) 20 MG tablet Take 20 mg by mouth daily. 04/22/19   [provider]  pantoprazole (PROTONIX) 40 MG tablet TAKE 1 TABLET BY MOUTH TWICE DAILY. Patient taking differently: Take 40 mg by mouth 2 (two) times daily.  12/26/18   Belva Crome, MD  potassium chloride SA (KLOR-CON) 20 MEQ tablet Take 1 tablet (20 mEq total) by mouth daily. 04/22/19   Tommie Raymond, NP  rivaroxaban (XARELTO) 20 MG TABS tablet TAKE 1 TABLET ONCE DAILY WITH DINNER. Patient taking differently: Take 20 mg by mouth daily with supper.  11/22/18   Richardson Dopp T, PA-C    Physical Exam: Vitals:   05/02/19 1324 05/02/19 1340 05/02/19 1912  BP: 118/79  106/76  Pulse: 80    Resp: 18    Temp: 98.2 F (36.8 C)    TempSrc:  Oral    SpO2: 100%    Weight:  105.2 kg   Height:  6\' 1"  (1.854 m)     Constitutional: NAD, calm, comfortable Eyes: PERRL, lids and conjunctivae normal ENMT: Mucous membranes are moist. Posterior pharynx clear of any exudate or lesions.Normal dentition.  Neck: normal, supple, no masses, no thyromegaly Respiratory: clear to auscultation bilaterally, no wheezing, no crackles. Normal respiratory effort. No accessory muscle use.  Cardiovascular: Regular rate and rhythm, no murmurs / rubs / gallops. No extremity edema. 2+ pedal pulses. No carotid bruits.  Abdomen: no tenderness, no  masses palpated. No hepatosplenomegaly. Bowel sounds positive.  Musculoskeletal: no clubbing / cyanosis. No joint deformity upper and lower extremities. Good ROM, no contractures. Normal muscle tone.  Skin: no rashes, lesions, ulcers. No induration Neurologic: CN 2-12 grossly intact. Sensation intact, DTR normal. Strength 5/5 in all 4.  Psychiatric: Normal judgment and insight. Alert and oriented x 3. Normal mood.    Labs on Admission: I have personally reviewed following labs and imaging studies  CBC: Recent Labs  Lab 05/02/19 1342  WBC 8.0  HGB 13.7  HCT 42.5  MCV 91.8  PLT Q000111Q   Basic Metabolic Panel: Recent Labs  Lab 04/30/19 1200 05/02/19 1342  NA 136 136  K 4.7 4.4  CL 105 105  CO2 21* 23  GLUCOSE 151* 111*  BUN 14 15  CREATININE 1.13 1.06  CALCIUM 8.9 9.2  MG 2.3  --    GFR: Estimated Creatinine Clearance: 86.1 mL/min (by C-G formula based on SCr of 1.06 mg/dL). Liver Function Tests: Recent Labs  Lab 05/02/19 1342  AST 62*  ALT 95*  ALKPHOS 89  BILITOT 1.1  PROT 6.4*  ALBUMIN 3.6   No results for input(s): LIPASE, AMYLASE in the last 168 hours. No results for input(s): AMMONIA in the last 168 hours. Coagulation Profile: No results for input(s): INR, PROTIME in the last 168 hours. Cardiac Enzymes: No results for input(s): CKTOTAL, CKMB, CKMBINDEX, TROPONINI in the last 168  hours. BNP (last 3 results) No results for input(s): PROBNP in the last 8760 hours. HbA1C: No results for input(s): HGBA1C in the last 72 hours. CBG: No results for input(s): GLUCAP in the last 168 hours. Lipid Profile: No results for input(s): CHOL, HDL, LDLCALC, TRIG, CHOLHDL, LDLDIRECT in the last 72 hours. Thyroid Function Tests: No results for input(s): TSH, T4TOTAL, FREET4, T3FREE, THYROIDAB in the last 72 hours. Anemia Panel: No results for input(s): VITAMINB12, FOLATE, FERRITIN, TIBC, IRON, RETICCTPCT in the last 72 hours. Urine analysis:    Component Value Date/Time   COLORURINE YELLOW 04/21/2019 2010   APPEARANCEUR CLEAR 04/21/2019 2010   LABSPEC 1.009 04/21/2019 2010   PHURINE 6.0 04/21/2019 2010   GLUCOSEU NEGATIVE 04/21/2019 2010   [place] NEGATIVE 04/21/2019 2010   [place] NEGATIVE 04/21/2019 2010   [place] NEGATIVE 04/21/2019 2010   PROTEINUR NEGATIVE 04/21/2019 2010   NITRITE NEGATIVE 04/21/2019 2010   LEUKOCYTESUR NEGATIVE 04/21/2019 2010    Radiological Exams on Admission: No results found.  EKG: Independently reviewed.  Assessment/Plan Principal Problem:   Hematochezia Active Problems:   Atrial fibrillation (HCC)   HTN (hypertension)   Type 2 diabetes mellitus without complication, without long-term current use of insulin ([place])    1. Hematochezia - 1. Obs for diverticular bleed 2. Tele monitor 3. Repeat CBC/BMP in AM 4. Dr. Collene Mares to see in AM 2. PAF - currently NSR 1. Hold Xarelto 2. Continue tikosyn 3. Tele monitor 3. DM2 - 1. Hold glipizide 2. Sensitive SSI AC/HS 4. HTN - 1. Hold ACEi  DVT prophylaxis: SCDs Code Status: Full Family Communication: No family in room Disposition Plan: Home after admit Consults called: EDP called Dr. Collene Mares who will see in AM Admission status: Place in obs  Ayane Delancey, [facility]  How to contact the [facility] Attending or Consulting provider [place] or covering provider during after  hours [place], for this patient?  1. Check the care team in [facility] and look for a) attending/consulting [facility] provider listed and b) the [facility] team listed 2.  Log into [email or web address]  Amion Physician Scheduling and messaging for groups and whole hospitals  On call and physician scheduling software for group practices, residents, hospitalists and other medical providers for call, clinic, rotation and shift schedules. On[employer] is a hospital-wide system for scheduling doctors and paging doctors on call. EasyPlot is for scientific plotting and data analysis.  [email or web address]  and use [place]'s universal password to access. If you do not have the password, please contact the hospital operator.  3. Locate the [facility] provider you are looking for under [facility] and page to a number that you can be directly reached. 4. If you still have difficulty reaching the provider, please page the [facility] (Director on Call) for the Hospitalists listed on amion for assistance.  05/02/2019, 7:50 PM

## 2019-05-02 NOTE — ED Notes (Signed)
Pt transported via cart to 6N. Pt conscious, breathing, and A&Ox4. No distress noted. All belongings with pt. Pt on monitors.

## 2019-05-03 DIAGNOSIS — G4733 Obstructive sleep apnea (adult) (pediatric): Secondary | ICD-10-CM

## 2019-05-03 DIAGNOSIS — Z7984 Long term (current) use of oral hypoglycemic drugs: Secondary | ICD-10-CM | POA: Diagnosis not present

## 2019-05-03 DIAGNOSIS — K625 Hemorrhage of anus and rectum: Secondary | ICD-10-CM | POA: Diagnosis not present

## 2019-05-03 DIAGNOSIS — E785 Hyperlipidemia, unspecified: Secondary | ICD-10-CM | POA: Diagnosis present

## 2019-05-03 DIAGNOSIS — Z7982 Long term (current) use of aspirin: Secondary | ICD-10-CM | POA: Diagnosis not present

## 2019-05-03 DIAGNOSIS — Z79899 Other long term (current) drug therapy: Secondary | ICD-10-CM | POA: Diagnosis not present

## 2019-05-03 DIAGNOSIS — Z888 Allergy status to other drugs, medicaments and biological substances status: Secondary | ICD-10-CM | POA: Diagnosis not present

## 2019-05-03 DIAGNOSIS — K8020 Calculus of gallbladder without cholecystitis without obstruction: Secondary | ICD-10-CM | POA: Diagnosis present

## 2019-05-03 DIAGNOSIS — Z86711 Personal history of pulmonary embolism: Secondary | ICD-10-CM | POA: Diagnosis not present

## 2019-05-03 DIAGNOSIS — K5731 Diverticulosis of large intestine without perforation or abscess with bleeding: Secondary | ICD-10-CM | POA: Diagnosis not present

## 2019-05-03 DIAGNOSIS — R161 Splenomegaly, not elsewhere classified: Secondary | ICD-10-CM | POA: Diagnosis present

## 2019-05-03 DIAGNOSIS — Z20822 Contact with and (suspected) exposure to covid-19: Secondary | ICD-10-CM | POA: Diagnosis present

## 2019-05-03 DIAGNOSIS — I480 Paroxysmal atrial fibrillation: Secondary | ICD-10-CM | POA: Diagnosis not present

## 2019-05-03 DIAGNOSIS — K5791 Diverticulosis of intestine, part unspecified, without perforation or abscess with bleeding: Secondary | ICD-10-CM | POA: Diagnosis not present

## 2019-05-03 DIAGNOSIS — Z7901 Long term (current) use of anticoagulants: Secondary | ICD-10-CM | POA: Diagnosis not present

## 2019-05-03 DIAGNOSIS — K922 Gastrointestinal hemorrhage, unspecified: Secondary | ICD-10-CM

## 2019-05-03 DIAGNOSIS — K5733 Diverticulitis of large intestine without perforation or abscess with bleeding: Secondary | ICD-10-CM | POA: Diagnosis present

## 2019-05-03 DIAGNOSIS — K7460 Unspecified cirrhosis of liver: Secondary | ICD-10-CM | POA: Diagnosis not present

## 2019-05-03 DIAGNOSIS — K921 Melena: Secondary | ICD-10-CM | POA: Diagnosis not present

## 2019-05-03 DIAGNOSIS — K635 Polyp of colon: Secondary | ICD-10-CM | POA: Diagnosis present

## 2019-05-03 DIAGNOSIS — I10 Essential (primary) hypertension: Secondary | ICD-10-CM | POA: Diagnosis not present

## 2019-05-03 DIAGNOSIS — Z87820 Personal history of traumatic brain injury: Secondary | ICD-10-CM | POA: Diagnosis not present

## 2019-05-03 DIAGNOSIS — D509 Iron deficiency anemia, unspecified: Secondary | ICD-10-CM | POA: Diagnosis not present

## 2019-05-03 DIAGNOSIS — E7800 Pure hypercholesterolemia, unspecified: Secondary | ICD-10-CM | POA: Diagnosis present

## 2019-05-03 DIAGNOSIS — D62 Acute posthemorrhagic anemia: Secondary | ICD-10-CM

## 2019-05-03 DIAGNOSIS — E119 Type 2 diabetes mellitus without complications: Secondary | ICD-10-CM | POA: Diagnosis present

## 2019-05-03 DIAGNOSIS — Z8249 Family history of ischemic heart disease and other diseases of the circulatory system: Secondary | ICD-10-CM | POA: Diagnosis not present

## 2019-05-03 DIAGNOSIS — G4700 Insomnia, unspecified: Secondary | ICD-10-CM | POA: Diagnosis present

## 2019-05-03 DIAGNOSIS — Z86718 Personal history of other venous thrombosis and embolism: Secondary | ICD-10-CM | POA: Diagnosis not present

## 2019-05-03 DIAGNOSIS — I2510 Atherosclerotic heart disease of native coronary artery without angina pectoris: Secondary | ICD-10-CM | POA: Diagnosis present

## 2019-05-03 LAB — HEPATITIS C ANTIBODY: HCV Ab: NONREACTIVE

## 2019-05-03 LAB — CBC
HCT: 37 % — ABNORMAL LOW (ref 39.0–52.0)
Hemoglobin: 12.1 g/dL — ABNORMAL LOW (ref 13.0–17.0)
MCH: 29.7 pg (ref 26.0–34.0)
MCHC: 32.7 g/dL (ref 30.0–36.0)
MCV: 90.9 fL (ref 80.0–100.0)
Platelets: 171 10*3/uL (ref 150–400)
RBC: 4.07 MIL/uL — ABNORMAL LOW (ref 4.22–5.81)
RDW: 14.8 % (ref 11.5–15.5)
WBC: 7.2 10*3/uL (ref 4.0–10.5)
nRBC: 0 % (ref 0.0–0.2)

## 2019-05-03 LAB — HEMOGLOBIN AND HEMATOCRIT, BLOOD
HCT: 39.3 % (ref 39.0–52.0)
Hemoglobin: 13.4 g/dL (ref 13.0–17.0)

## 2019-05-03 LAB — GLUCOSE, CAPILLARY
Glucose-Capillary: 136 mg/dL — ABNORMAL HIGH (ref 70–99)
Glucose-Capillary: 157 mg/dL — ABNORMAL HIGH (ref 70–99)
Glucose-Capillary: 102 mg/dL — ABNORMAL HIGH (ref 70–99)
Glucose-Capillary: 184 mg/dL — ABNORMAL HIGH (ref 70–99)

## 2019-05-03 LAB — COMPREHENSIVE METABOLIC PANEL
ALT: 83 U/L — ABNORMAL HIGH (ref 0–44)
AST: 54 U/L — ABNORMAL HIGH (ref 15–41)
Albumin: 3.2 g/dL — ABNORMAL LOW (ref 3.5–5.0)
Alkaline Phosphatase: 71 U/L (ref 38–126)
Anion gap: 8 (ref 5–15)
BUN: 14 mg/dL (ref 8–23)
CO2: 23 mmol/L (ref 22–32)
Calcium: 8.8 mg/dL — ABNORMAL LOW (ref 8.9–10.3)
Chloride: 105 mmol/L (ref 98–111)
Creatinine, Ser: 1.06 mg/dL (ref 0.61–1.24)
GFR calc Af Amer: >60 mL/min (ref >60)
GFR calc non Af Amer: >60 mL/min (ref >60)
Glucose, Bld: 134 mg/dL — ABNORMAL HIGH (ref 70–99)
Potassium: 4.4 mmol/L (ref 3.5–5.1)
Sodium: 136 mmol/L (ref 135–145)
Total Bilirubin: 1.3 mg/dL — ABNORMAL HIGH (ref 0.3–1.2)
Total Protein: 5.7 g/dL — ABNORMAL LOW (ref 6.5–8.1)

## 2019-05-03 LAB — HEPATITIS B SURFACE ANTIGEN: Hepatitis B Surface Ag: NONREACTIVE

## 2019-05-03 LAB — HEPATITIS B SURFACE ANTIBODY,QUALITATIVE: Hep B S Ab: NONREACTIVE

## 2019-05-03 MED ORDER — CLINDAMYCIN PHOSPHATE 1 % EX GEL
1.0000 "application " | Freq: Two times a day (BID) | CUTANEOUS | Status: DC
  Filled 2019-05-03: qty 30

## 2019-05-03 MED ORDER — LORATADINE 10 MG PO TABS
10.0000 mg | ORAL_TABLET | Freq: Every day | ORAL | Status: DC
  Administered 2019-05-03 – 2019-05-05 (×3): 10 mg via ORAL
  Filled 2019-05-03 (×3): qty 1

## 2019-05-03 MED ORDER — POTASSIUM CHLORIDE CRYS ER 20 MEQ PO TBCR
20.0000 meq | EXTENDED_RELEASE_TABLET | Freq: Every day | ORAL | Status: DC
  Administered 2019-05-03 – 2019-05-05 (×3): 20 meq via ORAL
  Filled 2019-05-03 (×3): qty 1

## 2019-05-03 MED ORDER — SODIUM CHLORIDE 0.9 % IV SOLN: INTRAVENOUS | Status: DC

## 2019-05-03 NOTE — Procedures (Signed)
Patient declined CPAP for tonight.  

## 2019-05-03 NOTE — Progress Notes (Addendum)
Carlos Gutierrez is a 70 y.o. male patient admitted from ED awake, alert - oriented  X 4 - no acute distress noted.  VSS - Blood pressure 130/74, pulse 66, temperature 98.3 F (36.8 C), temperature source Oral, resp. rate 17, height 6\' 1"  (1.854 m), weight 105.2 kg, SpO2 100 %.    IV in place, occlusive dsg intact without redness.   Will cont to eval and treat per MD orders.  Vidal Schwalbe, RN 05/03/2019 0000

## 2019-05-03 NOTE — Consult Note (Signed)
Reason for Consult: Hematochezia Referring Physician: [facility]  Christen Butter HPI: This is a 69 year old male with a PMH of sigmoid diverticulosis, history of a left leg DVT, afib on Xarelto, and CAD admitted for three episodes of painless hematochezia.  Per the patient he was treated for a clinical presentation consistent with diverticulitis using Augmentin.  His last colonoscopy was performed on 03/12/2016 with findings of a transverse colon adenoma as well as sigmoid diverticula.  The colonoscopy was performed for routine purposes.  An EGD performed on 10/16/2015 for noncardiac chest pain was essentially normal.  An incidental finding of cirrhosis and splenomegaly was identified with his CT scan on 04/22/2019.  The patient denies any prior history of illicit drug use, but he does drink 8-10 drinks of wine and liquor per week.  He denies having a blood transfusion before 1990.  His brother has cirrhosis, but his brother abuses drugs and ETOH.  Past Medical History:  Diagnosis Date  . ACL tear    RIGHT KNEE  . Atrial fibrillation ([place]) 2012   Rare occurrences. On ASA & Flecainide prn  . Atrial flutter ([place])    s/p ablation 2008. On Flecainide prn.  . Broken leg 1968  . Concussion 1966   History of 4  . DVT (deep venous thrombosis) (HCC)    LEFT LOWER LEG  . Dyslipidemia   . Essential hypertension, benign   . Insomnia   . Low back pain   . Obesity (BMI 30-39.9)   . Obstructive sleep apnea on CPAP    Mild  . PAF (paroxysmal atrial fibrillation) ([place])    Rare occurrences. On ASA & Flecainide prn  . Pure hypercholesterolemia    On Lipitor   . Right shoulder pain    Intermittent  . Status post ablation of atrial flutter 2008  . Umbilical hernia     Past Surgical History:  Procedure Laterality Date  . ablation for atrial flutter  01/2002  . CARDIAC CATHETERIZATION  12/26/01  . CARDIAC CATHETERIZATION  04/18/2019  . COLONOSCOPY WITH PROPOFOL N/A 03/12/2016   Procedure:  COLONOSCOPY WITH PROPOFOL;  Surgeon: [name], MD;  Location: [facility];  Service: Endoscopy;  Laterality: N/A;  . coronary angiography  1997   40% stenosis  . ESOPHAGOGASTRODUODENOSCOPY (EGD) WITH PROPOFOL N/A 10/15/2016   Procedure: ESOPHAGOGASTRODUODENOSCOPY (EGD) WITH PROPOFOL;  Surgeon: [name], MD;  Location: [facility];  Service: Endoscopy;  Laterality: N/A;  . LEFT HEART CATH AND CORONARY ANGIOGRAPHY N/A 02/11/2017   Procedure: LEFT HEART CATH AND CORONARY ANGIOGRAPHY;  Surgeon: Belva Crome, MD;  Location: [facility];  Service: Cardiovascular;  Laterality: N/A;  . LEFT HEART CATH AND CORONARY ANGIOGRAPHY N/A 04/18/2019   Procedure: LEFT HEART CATH AND CORONARY ANGIOGRAPHY;  Surgeon: Belva Crome, MD;  Location: [facility];  Service: Cardiovascular;  Laterality: N/A;  . right knee surgery  1996   ACL, meniscus problems  . UMBILICAL HERNIA REPAIR  04/2015    Family History  Problem Relation Age of Onset  . Hypertension Mother 17  . Diabetes Mother   . COPD Mother   . Heart disease Mother   . Heart disease Father 86  . Hypertension Father   . Heart failure Father   . Heart attack Father   . Diabetes Brother 62  . Hypertension Brother 87  . Heart attack Paternal Grandfather   . Colon cancer Maternal Grandfather 49  . Stroke Neg Hx  Social History:  reports that he has never smoked. He has never used smokeless tobacco. He reports current alcohol use of about 7.0 - 10.0 standard drinks of alcohol per week. He reports that he does not use drugs.  Allergies:  Allergies  Allergen Reactions  . Metformin And Related Nausea And Vomiting and Other (See Comments)    Dizziness and disorientation, too    Medications:  Scheduled: . atorvastatin  80 mg Oral q1800  . cholecalciferol  2,000 Units Oral Daily  . clindamycin  1 application Topical BID  . dofetilide  500 mcg Oral BID  . insulin aspart  0-5 Units Subcutaneous QHS  . insulin aspart  0-9  Units Subcutaneous TID WC  . loratadine  10 mg Oral Daily  . multivitamin with minerals  1 tablet Oral Daily  . pantoprazole  40 mg Oral BID  . potassium chloride SA  20 mEq Oral Daily   Continuous:   Results for orders placed or performed during the hospital encounter of 05/02/19 (from the past 24 hour(s))  Type and screen [place]     Status: None   Collection Time: 05/02/19  1:35 PM  Result Value Ref Range   ABO/RH(D) A POS    Antibody Screen NEG    Sample Expiration      05/05/2019,2359 Performed at [facility], [place] [address] [place], [place] [address]   ABO/Rh     Status: None   Collection Time: 05/02/19  1:35 PM  Result Value Ref Range   ABO/RH(D)      A POS Performed at [facility] [address], [place], [place] [address]   Comprehensive metabolic panel     Status: Abnormal   Collection Time: 05/02/19  1:42 PM  Result Value Ref Range   Sodium 136 135 - 145 mmol/L   Potassium 4.4 3.5 - 5.1 mmol/L   Chloride 105 98 - 111 mmol/L   CO2 23 22 - 32 mmol/L   Glucose, Bld 111 (H) 70 - 99 mg/dL   BUN 15 8 - 23 mg/dL   Creatinine, Ser 1.06 0.61 - 1.24 mg/dL   Calcium 9.2 8.9 - 10.3 mg/dL   Total Protein 6.4 (L) 6.5 - 8.1 g/dL   Albumin 3.6 3.5 - 5.0 g/dL   AST 62 (H) 15 - 41 U/L   ALT 95 (H) 0 - 44 U/L   Alkaline Phosphatase 89 38 - 126 U/L   Total Bilirubin 1.1 0.3 - 1.2 mg/dL   GFR calc non Af Amer >60 >60 mL/min   GFR calc Af Amer >60 >60 mL/min   Anion gap 8 5 - 15  CBC     Status: None   Collection Time: 05/02/19  1:42 PM  Result Value Ref Range   WBC 8.0 4.0 - 10.5 K/uL   RBC 4.63 4.22 - 5.81 MIL/uL   Hemoglobin 13.7 13.0 - 17.0 g/dL   HCT 42.5 39.0 - 52.0 %   MCV 91.8 80.0 - 100.0 fL   MCH 29.6 26.0 - 34.0 pg   MCHC 32.2 30.0 - 36.0 g/dL   RDW 14.8 11.5 - 15.5 %   Platelets 194 150 - 400 K/uL   nRBC 0.0 0.0 - 0.2 %  POC occult blood, ED     Status: Abnormal   Collection Time: 05/02/19  3:37 PM  Result Value Ref  Range   Fecal Occult Bld POSITIVE (A) NEGATIVE  Respiratory Panel by RT PCR (Flu  A&B, Covid) - Nasopharyngeal Swab     Status: None   Collection Time: 05/02/19  7:26 PM   Specimen: Nasopharyngeal Swab  Result Value Ref Range   SARS Coronavirus 2 by RT PCR NEGATIVE NEGATIVE   Influenza A by PCR NEGATIVE NEGATIVE   Influenza B by PCR NEGATIVE NEGATIVE  CBG monitoring, ED     Status: Abnormal   Collection Time: 05/02/19 10:18 PM  Result Value Ref Range   Glucose-Capillary 102 (H) 70 - 99 mg/dL  CBC     Status: Abnormal   Collection Time: 05/03/19  1:03 AM  Result Value Ref Range   WBC 7.2 4.0 - 10.5 K/uL   RBC 4.07 (L) 4.22 - 5.81 MIL/uL   Hemoglobin 12.1 (L) 13.0 - 17.0 g/dL   HCT 37.0 (L) 39.0 - 52.0 %   MCV 90.9 80.0 - 100.0 fL   MCH 29.7 26.0 - 34.0 pg   MCHC 32.7 30.0 - 36.0 g/dL   RDW 14.8 11.5 - 15.5 %   Platelets 171 150 - 400 K/uL   nRBC 0.0 0.0 - 0.2 %  Comprehensive metabolic panel     Status: Abnormal   Collection Time: 05/03/19  1:03 AM  Result Value Ref Range   Sodium 136 135 - 145 mmol/L   Potassium 4.4 3.5 - 5.1 mmol/L   Chloride 105 98 - 111 mmol/L   CO2 23 22 - 32 mmol/L   Glucose, Bld 134 (H) 70 - 99 mg/dL   BUN 14 8 - 23 mg/dL   Creatinine, Ser 1.06 0.61 - 1.24 mg/dL   Calcium 8.8 (L) 8.9 - 10.3 mg/dL   Total Protein 5.7 (L) 6.5 - 8.1 g/dL   Albumin 3.2 (L) 3.5 - 5.0 g/dL   AST 54 (H) 15 - 41 U/L   ALT 83 (H) 0 - 44 U/L   Alkaline Phosphatase 71 38 - 126 U/L   Total Bilirubin 1.3 (H) 0.3 - 1.2 mg/dL   GFR calc non Af Amer >60 >60 mL/min   GFR calc Af Amer >60 >60 mL/min   Anion gap 8 5 - 15  Glucose, capillary     Status: Abnormal   Collection Time: 05/03/19  8:38 AM  Result Value Ref Range   Glucose-Capillary 136 (H) 70 - 99 mg/dL     No results found.  ROS:  As stated above in the HPI otherwise negative.  Blood pressure 109/72, pulse 62, temperature 98.4 F (36.9 C), temperature source Oral, resp. rate 16, height 6\' 1"  (1.854 m), weight  105.2 kg, SpO2 100 %.    PE: Gen: NAD, Alert and Oriented HEENT:  /AT, EOMI Neck: Supple, no LAD Lungs: CTA Bilaterally CV: RRR without M/G/R ABM: Soft, NTND, +BS Ext: No C/C/E  Assessment/Plan: 1) Probable diverticular bleed. 2) Anemia. 3) Cirrhosis. 4) Afib on Xarelto.   The patient's clinical presentation is consistent with a diverticular bleed.  His HGB is relatively stable, but his last bleed was at 11 AM.  In total he reports 11 hematochezia episodes so far.  He will be monitored and transfuse as necessary.  The patient's incidental finding of cirrhosis was discussed with him in detail.  Review of his liver enzymes does show some mild elevations.  It is not clear if he has NASH versus ETOH cirrhosis or a combination of the two.  His ETOH consumption is not to the degree of alcoholism, but he was instructed to abstain indefinitely.  Plan: 1) Follow HGB and transfuse as  necessary. 2) Restart Xarelto in 2-3 days after no further hematochezia. 3) Check HBV and HCV serologies. 4) Advance to a regular diet.  Daegen Berrocal D 05/03/2019, 10:45 AM

## 2019-05-03 NOTE — Progress Notes (Addendum)
[facility] NOTE    CADEN Gutierrez  MBT:[id number] DOB: Aug 23, 1950 DOA: 05/02/2019 PCP: London Pepper, MD    Brief Narrative:  HPI per Dr. Lucretia Field is a 69 y.o. male with medical history significant of A.Fib on Xarelto, non-occlusive CAD.  Patient presented to ED after 3 bloody BMs this morning.  Recently treated with course of amoxicillin empirically for diverticulitis diagnosed clinically (not seen on CT), finished ABx the morning of admission.  Abd pain and fever improved with ABx, but bleeding started this morning.  No CP, SOB, syncope, lightheadedness.   ED Course: HGB nl, EDP wants obs for diverticular bleed.  Assessment & Plan:   Principal Problem:   Hematochezia Active Problems:   Atrial fibrillation (HCC)   OSA (obstructive sleep apnea)   HTN (hypertension)   Type 2 diabetes mellitus without complication, without long-term current use of insulin (HCC)   Lower GI bleed   Acute blood loss anemia   1 hematochezia Likely secondary to lower GI bleed secondary to diverticular bleed.  Patient recently treated for a bout of acute diverticulitis presenting with bright red blood per rectum.  Patient noted to have bloody bowel movements overnight.  Patient with 3 bloody bowel stools this morning.  Denies any lightheadedness or dizziness.  Hemoglobin currently at 12.1.  Repeat H&H this afternoon.  Continue clear liquid diet.  Continue to hold anticoagulation and will defer resumption of anticoagulation to GI.  Placed on gentle hydration with IV fluids.  GI consultation pending.  Follow.  2.  Acute blood loss anemia Secondary to problem #1.  Repeat H&H this afternoon.  Transfusion threshold hemoglobin less than 7 or significant drop in patient's hemoglobin with ongoing GI bleed.  3.  Paroxysmal atrial fibrillation CHA2DS2VASC score 4 Currently rate controlled on home regimen of Tikosyn.  In normal sinus rhythm.  Anticoagulation on hold secondary to problem #1.   GI to advise when anticoagulation may be resumed.  4.  Hypertension Blood pressure is soft this morning with systolic of 536.  We will hold patient's antihypertensive medications for now due to problem #1.  Follow.  5.  Well-controlled diabetes mellitus type 2 Hemoglobin A1c 6.0.  CBG of 136 this morning.  Continue to hold oral hypoglycemic agents.  Sliding scale insulin.  6.  Obstructive sleep apnea CPAP nightly.  7.  Cirrhosis Noted on CT abdomen and pelvis of 04/22/2019.  Hepatic serologies ordered.  Per GI.   DVT prophylaxis: SCDs Code Status: Full Family Communication: Updated patient.  No family at bedside Disposition Plan:  . Patient came from: Home            . Anticipated d/c place: Home . Barriers to d/c OR conditions which need to be met to effect a safe d/c: Home once bleeding has resolved, hemoglobin stable, tolerating regular diet, cleared by gastroenterology.   Consultants:   Gastroenterology pending  Procedures:   None  Antimicrobials:  None   Subjective: Patient with 3 bloody BRBPR this morning.  Patient standing up in room on the telephone.  Patient denies any chest pain or shortness of breath.  Patient with some complaints of lower abdominal pain.  Tolerating clears.  Was concerned about not getting his antihypertensive medications this morning.  Objective: Vitals:   05/02/19 2130 05/02/19 2200 05/02/19 2351 05/03/19 0550  BP: 108/75 118/90 130/74 109/72  Pulse: 60 63 66 62  Resp: 12 16 17 16   Temp:   98.3 F (36.8 C) 98.4 F (36.9 C)  TempSrc:   Oral Oral  SpO2: 99% 98% 100% 100%  Weight:   105.2 kg   Height:   6' 1"  (1.854 m)     Intake/Output Summary (Last 24 hours) at 05/03/2019 1522 Last data filed at 05/03/2019 1110 Gross per 24 hour  Intake 500 ml  Output 1 ml  Net 499 ml   Filed Weights   05/02/19 1340 05/02/19 2351  Weight: 105.2 kg 105.2 kg    Examination:  General exam: Appears calm and comfortable  Respiratory system:  Clear to auscultation. Respiratory effort normal. Cardiovascular system: S1 & S2 heard, RRR. No JVD, murmurs, rubs, gallops or clicks. No pedal edema. Gastrointestinal system: Abdomen is nondistended, soft and nontender. No organomegaly or masses felt. Normal bowel sounds heard. Central nervous system: Alert and oriented. No focal neurological deficits. Extremities: Symmetric 5 x 5 power. Skin: No rashes, lesions or ulcers Psychiatry: Judgement and insight appear normal. Mood & affect appropriate.     Data Reviewed: I have personally reviewed following labs and imaging studies  CBC: Recent Labs  Lab 05/02/19 1342 05/03/19 0103  WBC 8.0 7.2  HGB 13.7 12.1*  HCT 42.5 37.0*  MCV 91.8 90.9  PLT 194 088   Basic Metabolic Panel: Recent Labs  Lab 04/30/19 1200 05/02/19 1342 05/03/19 0103  NA 136 136 136  K 4.7 4.4 4.4  CL 105 105 105  CO2 21* 23 23  GLUCOSE 151* 111* 134*  BUN 14 15 14   CREATININE 1.13 1.06 1.06  CALCIUM 8.9 9.2 8.8*  MG 2.3  --   --    GFR: Estimated Creatinine Clearance: 86.1 mL/min (by C-G formula based on SCr of 1.06 mg/dL). Liver Function Tests: Recent Labs  Lab 05/02/19 1342 05/03/19 0103  AST 62* 54*  ALT 95* 83*  ALKPHOS 89 71  BILITOT 1.1 1.3*  PROT 6.4* 5.7*  ALBUMIN 3.6 3.2*   No results for input(s): LIPASE, AMYLASE in the last 168 hours. No results for input(s): AMMONIA in the last 168 hours. Coagulation Profile: No results for input(s): INR, PROTIME in the last 168 hours. Cardiac Enzymes: No results for input(s): CKTOTAL, CKMB, CKMBINDEX, TROPONINI in the last 168 hours. BNP (last 3 results) No results for input(s): PROBNP in the last 8760 hours. HbA1C: No results for input(s): HGBA1C in the last 72 hours. CBG: Recent Labs  Lab 05/02/19 2218 05/03/19 0838 05/03/19 1130  GLUCAP 102* 136* 157*   Lipid Profile: No results for input(s): CHOL, HDL, LDLCALC, TRIG, CHOLHDL, LDLDIRECT in the last 72 hours. Thyroid Function  Tests: No results for input(s): TSH, T4TOTAL, FREET4, T3FREE, THYROIDAB in the last 72 hours. Anemia Panel: No results for input(s): VITAMINB12, FOLATE, FERRITIN, TIBC, IRON, RETICCTPCT in the last 72 hours. Sepsis Labs: No results for input(s): PROCALCITON, LATICACIDVEN in the last 168 hours.  Recent Results (from the past 240 hour(s))  Respiratory Panel by RT PCR (Flu A&B, Covid) - Nasopharyngeal Swab     Status: None   Collection Time: 05/02/19  7:26 PM   Specimen: Nasopharyngeal Swab  Result Value Ref Range Status   SARS Coronavirus 2 by RT PCR NEGATIVE NEGATIVE Final    Comment: (NOTE) SARS-CoV-2 target nucleic acids are NOT DETECTED. The SARS-CoV-2 RNA is generally detectable in upper respiratoy specimens during the acute phase of infection. The lowest concentration of SARS-CoV-2 viral copies this assay can detect is 131 copies/mL. A negative result does not preclude SARS-Cov-2 infection and should not be used as the sole basis for treatment  or other patient management decisions. A negative result may occur with  improper specimen collection/handling, submission of specimen other than nasopharyngeal swab, presence of viral mutation(s) within the areas targeted by this assay, and inadequate number of viral copies (<131 copies/mL). A negative result must be combined with clinical observations, patient history, and epidemiological information. The expected result is Negative. Fact Sheet for Patients:  PinkCheek.be Fact Sheet for Healthcare Providers:  GravelBags.it This test is not yet ap proved or cleared by the [place] FDA and  has been authorized for detection and/or diagnosis of SARS-CoV-2 by FDA under an Emergency Use Authorization (EUA). This EUA will remain  in effect (meaning this test can be used) for the duration of the COVID-19 declaration under Section 564(b)(1) of the Act, 21 U.S.C. section  360bbb-3(b)(1), unless the authorization is terminated or revoked sooner.    Influenza A by PCR NEGATIVE NEGATIVE Final   Influenza B by PCR NEGATIVE NEGATIVE Final    Comment: (NOTE) The Xpert Xpress SARS-CoV-2/FLU/RSV assay is intended as an aid in  the diagnosis of influenza from Nasopharyngeal swab specimens and  should not be used as a sole basis for treatment. Nasal washings and  aspirates are unacceptable for Xpert Xpress SARS-CoV-2/FLU/RSV  testing. Fact Sheet for Patients: PinkCheek.be Fact Sheet for Healthcare Providers: GravelBags.it This test is not yet approved or cleared by the [place] FDA and  has been authorized for detection and/or diagnosis of SARS-CoV-2 by  FDA under an Emergency Use Authorization (EUA). This EUA will remain  in effect (meaning this test can be used) for the duration of the  Covid-19 declaration under Section 564(b)(1) of the Act, 21  U.S.C. section 360bbb-3(b)(1), unless the authorization is  terminated or revoked. Performed at [facility], [place] [address], [place], [place] [address]          Radiology Studies: No results found.      Scheduled Meds: . atorvastatin  80 mg Oral q1800  . cholecalciferol  2,000 Units Oral Daily  . clindamycin  1 application Topical BID  . dofetilide  500 mcg Oral BID  . insulin aspart  0-5 Units Subcutaneous QHS  . insulin aspart  0-9 Units Subcutaneous TID WC  . loratadine  10 mg Oral Daily  . multivitamin with minerals  1 tablet Oral Daily  . pantoprazole  40 mg Oral BID  . potassium chloride SA  20 mEq Oral Daily   Continuous Infusions: . sodium chloride 100 mL/hr at 05/03/19 1206     LOS: 0 days    Time spent: 40 minutes    Irine Seal, MD [facility]   To contact the attending provider between 7A-7P or the covering provider during after hours 7P-7A, please log into the web site [email or web address] and access  using universal [place] password for that web site. If you do not have the password, please call the hospital operator.  05/03/2019, 3:22 PM

## 2019-05-04 DIAGNOSIS — K5791 Diverticulosis of intestine, part unspecified, without perforation or abscess with bleeding: Secondary | ICD-10-CM | POA: Diagnosis not present

## 2019-05-04 DIAGNOSIS — G4700 Insomnia, unspecified: Secondary | ICD-10-CM | POA: Diagnosis present

## 2019-05-04 DIAGNOSIS — K921 Melena: Secondary | ICD-10-CM | POA: Diagnosis present

## 2019-05-04 DIAGNOSIS — Z79899 Other long term (current) drug therapy: Secondary | ICD-10-CM | POA: Diagnosis not present

## 2019-05-04 DIAGNOSIS — E7800 Pure hypercholesterolemia, unspecified: Secondary | ICD-10-CM | POA: Diagnosis present

## 2019-05-04 DIAGNOSIS — Z20822 Contact with and (suspected) exposure to covid-19: Secondary | ICD-10-CM | POA: Diagnosis present

## 2019-05-04 DIAGNOSIS — E785 Hyperlipidemia, unspecified: Secondary | ICD-10-CM | POA: Diagnosis present

## 2019-05-04 DIAGNOSIS — Z86711 Personal history of pulmonary embolism: Secondary | ICD-10-CM | POA: Diagnosis not present

## 2019-05-04 DIAGNOSIS — E119 Type 2 diabetes mellitus without complications: Secondary | ICD-10-CM | POA: Diagnosis present

## 2019-05-04 DIAGNOSIS — K5731 Diverticulosis of large intestine without perforation or abscess with bleeding: Secondary | ICD-10-CM | POA: Diagnosis not present

## 2019-05-04 DIAGNOSIS — D509 Iron deficiency anemia, unspecified: Secondary | ICD-10-CM | POA: Diagnosis not present

## 2019-05-04 DIAGNOSIS — I2510 Atherosclerotic heart disease of native coronary artery without angina pectoris: Secondary | ICD-10-CM | POA: Diagnosis present

## 2019-05-04 DIAGNOSIS — D62 Acute posthemorrhagic anemia: Secondary | ICD-10-CM | POA: Diagnosis not present

## 2019-05-04 DIAGNOSIS — K5733 Diverticulitis of large intestine without perforation or abscess with bleeding: Secondary | ICD-10-CM | POA: Diagnosis present

## 2019-05-04 DIAGNOSIS — I480 Paroxysmal atrial fibrillation: Secondary | ICD-10-CM | POA: Diagnosis present

## 2019-05-04 DIAGNOSIS — Z86718 Personal history of other venous thrombosis and embolism: Secondary | ICD-10-CM | POA: Diagnosis not present

## 2019-05-04 DIAGNOSIS — Z7984 Long term (current) use of oral hypoglycemic drugs: Secondary | ICD-10-CM | POA: Diagnosis not present

## 2019-05-04 DIAGNOSIS — Z87820 Personal history of traumatic brain injury: Secondary | ICD-10-CM | POA: Diagnosis not present

## 2019-05-04 DIAGNOSIS — I10 Essential (primary) hypertension: Secondary | ICD-10-CM | POA: Diagnosis present

## 2019-05-04 DIAGNOSIS — K7460 Unspecified cirrhosis of liver: Secondary | ICD-10-CM | POA: Diagnosis present

## 2019-05-04 DIAGNOSIS — Z8249 Family history of ischemic heart disease and other diseases of the circulatory system: Secondary | ICD-10-CM | POA: Diagnosis not present

## 2019-05-04 DIAGNOSIS — Z7901 Long term (current) use of anticoagulants: Secondary | ICD-10-CM | POA: Diagnosis not present

## 2019-05-04 DIAGNOSIS — K635 Polyp of colon: Secondary | ICD-10-CM | POA: Diagnosis present

## 2019-05-04 DIAGNOSIS — K922 Gastrointestinal hemorrhage, unspecified: Secondary | ICD-10-CM | POA: Diagnosis not present

## 2019-05-04 DIAGNOSIS — Z7982 Long term (current) use of aspirin: Secondary | ICD-10-CM | POA: Diagnosis not present

## 2019-05-04 DIAGNOSIS — K8020 Calculus of gallbladder without cholecystitis without obstruction: Secondary | ICD-10-CM | POA: Diagnosis present

## 2019-05-04 DIAGNOSIS — R161 Splenomegaly, not elsewhere classified: Secondary | ICD-10-CM | POA: Diagnosis present

## 2019-05-04 DIAGNOSIS — K625 Hemorrhage of anus and rectum: Secondary | ICD-10-CM | POA: Diagnosis not present

## 2019-05-04 DIAGNOSIS — Z888 Allergy status to other drugs, medicaments and biological substances status: Secondary | ICD-10-CM | POA: Diagnosis not present

## 2019-05-04 DIAGNOSIS — G4733 Obstructive sleep apnea (adult) (pediatric): Secondary | ICD-10-CM | POA: Diagnosis present

## 2019-05-04 LAB — BASIC METABOLIC PANEL
Anion gap: 8 (ref 5–15)
BUN: 11 mg/dL (ref 8–23)
CO2: 25 mmol/L (ref 22–32)
Calcium: 8.5 mg/dL — ABNORMAL LOW (ref 8.9–10.3)
Chloride: 107 mmol/L (ref 98–111)
Creatinine, Ser: 1.06 mg/dL (ref 0.61–1.24)
GFR calc Af Amer: >60 mL/min (ref >60)
GFR calc non Af Amer: >60 mL/min (ref >60)
Glucose, Bld: 133 mg/dL — ABNORMAL HIGH (ref 70–99)
Potassium: 4.3 mmol/L (ref 3.5–5.1)
Sodium: 140 mmol/L (ref 135–145)

## 2019-05-04 LAB — GLUCOSE, CAPILLARY
Glucose-Capillary: 127 mg/dL — ABNORMAL HIGH (ref 70–99)
Glucose-Capillary: 123 mg/dL — ABNORMAL HIGH (ref 70–99)
Glucose-Capillary: 131 mg/dL — ABNORMAL HIGH (ref 70–99)
Glucose-Capillary: 91 mg/dL (ref 70–99)

## 2019-05-04 LAB — CBC WITH DIFFERENTIAL/PLATELET
Abs Immature Granulocytes: 0.03 10*3/uL (ref 0.00–0.07)
Basophils Absolute: 0.1 10*3/uL (ref 0.0–0.1)
Basophils Relative: 1 %
Eosinophils Absolute: 0.2 10*3/uL (ref 0.0–0.5)
Eosinophils Relative: 3 %
HCT: 35.1 % — ABNORMAL LOW (ref 39.0–52.0)
Hemoglobin: 11.6 g/dL — ABNORMAL LOW (ref 13.0–17.0)
Immature Granulocytes: 1 %
Lymphocytes Relative: 63 %
Lymphs Abs: 3.6 10*3/uL (ref 0.7–4.0)
MCH: 30.1 pg (ref 26.0–34.0)
MCHC: 33 g/dL (ref 30.0–36.0)
MCV: 91.2 fL (ref 80.0–100.0)
Monocytes Absolute: 0.4 10*3/uL (ref 0.1–1.0)
Monocytes Relative: 7 %
Neutro Abs: 1.4 10*3/uL — ABNORMAL LOW (ref 1.7–7.7)
Neutrophils Relative %: 25 %
Platelets: 162 10*3/uL (ref 150–400)
RBC: 3.85 MIL/uL — ABNORMAL LOW (ref 4.22–5.81)
RDW: 14.8 % (ref 11.5–15.5)
WBC: 5.6 10*3/uL (ref 4.0–10.5)
nRBC: 0 % (ref 0.0–0.2)

## 2019-05-04 LAB — MAGNESIUM: Magnesium: 2.2 mg/dL (ref 1.7–2.4)

## 2019-05-04 LAB — HEMOGLOBIN AND HEMATOCRIT, BLOOD
HCT: 39.9 % (ref 39.0–52.0)
Hemoglobin: 12.7 g/dL — ABNORMAL LOW (ref 13.0–17.0)

## 2019-05-04 MED ORDER — SODIUM CHLORIDE 0.9 % IV SOLN: INTRAVENOUS | Status: DC

## 2019-05-04 MED ORDER — PEG 3350-KCL-NA BICARB-NACL 420 G PO SOLR
4000.0000 mL | Freq: Once | ORAL | Status: AC
  Administered 2019-05-04: 4000 mL via ORAL
  Filled 2019-05-04: qty 4000

## 2019-05-04 NOTE — Progress Notes (Signed)
Pt declined CPAP at this time. I told him if he changes his mind to call RT

## 2019-05-04 NOTE — Progress Notes (Signed)
Subjective: Still with hematochezia.  He reports 5 episodes yesterday.  Objective: Vital signs in last 24 hours: Temp:  [97.9 F (36.6 C)-99.1 F (37.3 C)] 98.5 F (36.9 C) (02/19 0531) Pulse Rate:  [64-70] 65 (02/19 0531) Resp:  [16-18] 16 (02/19 0531) BP: (102-110)/(67-81) 102/67 (02/19 0531) SpO2:  [96 %-98 %] 96 % (02/19 0531) Last BM Date: 05/04/19  Intake/Output from previous day: 02/18 0701 - 02/19 0700 In: 2646.4 [P.O.:1760; I.V.:886.4] Out: 1 [Stool:1] Intake/Output this shift: Total I/O In: 1229.9 [P.O.:480; I.V.:749.9] Out: -   General appearance: alert and no distress GI: soft, non-tender; bowel sounds normal; no masses,  no organomegaly  Lab Results: Recent Labs    05/02/19 1342 05/02/19 1342 05/03/19 0103 05/03/19 1544 05/04/19 0337  WBC 8.0  --  7.2  --  5.6  HGB 13.7   < > 12.1* 13.4 11.6*  HCT 42.5   < > 37.0* 39.3 35.1*  PLT 194  --  171  --  162   < > = values in this interval not displayed.   BMET Recent Labs    05/02/19 1342 05/03/19 0103 05/04/19 0337  NA 136 136 140  K 4.4 4.4 4.3  CL 105 105 107  CO2 23 23 25   GLUCOSE 111* 134* 133*  BUN 15 14 11   CREATININE 1.06 1.06 1.06  CALCIUM 9.2 8.8* 8.5*   LFT Recent Labs    05/03/19 0103  PROT 5.7*  ALBUMIN 3.2*  AST 54*  ALT 83*  ALKPHOS 71  BILITOT 1.3*   PT/INR No results for input(s): LABPROT, INR in the last 72 hours. Hepatitis Panel Recent Labs    05/03/19 1438  HEPBSAG NON REACTIVE  HCVAB NON REACTIVE   C-Diff No results for input(s): CDIFFTOX in the last 72 hours. Fecal Lactopherrin No results for input(s): FECLLACTOFRN in the last 72 hours.  Studies/Results: No results found.  Medications:  Scheduled: . atorvastatin  80 mg Oral q1800  . cholecalciferol  2,000 Units Oral Daily  . clindamycin  1 application Topical BID  . dofetilide  500 mcg Oral BID  . insulin aspart  0-5 Units Subcutaneous QHS  . insulin aspart  0-9 Units Subcutaneous TID WC  .  loratadine  10 mg Oral Daily  . multivitamin with minerals  1 tablet Oral Daily  . pantoprazole  40 mg Oral BID  . potassium chloride SA  20 mEq Oral Daily   Continuous:   Assessment/Plan: 1) Hematochezia. 2) Probable diverticular bleed. 3) Anemia. 4) Cirrhosis - Negative for HCV and HBV.   The patient continues to have hematochezia.  A colonoscopy will be performed tomorrow.  Plan: 1) Colonoscopy. 2) Follow HGB and transfuse as necessary.  LOS: 0 days   Carlos Gutierrez 05/04/2019, 10:51 AM

## 2019-05-04 NOTE — Progress Notes (Signed)
PROGRESS NOTE    Carlos Gutierrez  EAV:[id number] DOB: Mar 11, 1951 DOA: 05/02/2019 PCP: London Pepper, MD    Brief Narrative:  HPI per Dr. Lucretia Field is a 69 y.o. male with medical history significant of A.Fib on Xarelto, non-occlusive CAD.  Patient presented to ED after 3 bloody BMs this morning.  Recently treated with course of amoxicillin empirically for diverticulitis diagnosed clinically (not seen on CT), finished ABx the morning of admission.  Abd pain and fever improved with ABx, but bleeding started this morning.  No CP, SOB, syncope, lightheadedness.   ED Course: HGB nl, EDP wants obs for diverticular bleed.  Assessment & Plan:   Principal Problem:   Hematochezia Active Problems:   Atrial fibrillation (HCC)   OSA (obstructive sleep apnea)   HTN (hypertension)   Type 2 diabetes mellitus without complication, without long-term current use of insulin (HCC)   Lower GI bleed   Acute blood loss anemia   1 hematochezia Likely secondary to lower GI bleed secondary to diverticular bleed.  Patient recently treated for a bout of acute diverticulitis presenting with bright red blood per rectum.  Patient noted to have ongoing bloody bowel movements overnight and early this morning.  Patient denies any dizziness or lightheadedness.  Hemoglobin currently at 11.6 from 13.7 on admission.  Patient on a clear liquid diet.  Repeat H&H this afternoon.  Continue IV fluids.  Patient seen in consultation by gastroenterology who are recommending colonoscopy to be done tomorrow 05/05/2019.  Appreciate GI input and recommendations.  2.  Acute blood loss anemia Secondary to problem #1.  Hemoglobin currently at 11.6 from 13.7 on admission.  Repeat H&H this afternoon.  Transfusion threshold hemoglobin < 7 or significant drop in patient's hemoglobin with ongoing GI bleed.  3.  Paroxysmal atrial fibrillation CHA2DS2VASC score 4 Currently rate controlled on home regimen of Tikosyn.   In normal sinus rhythm.  Anticoagulation on hold secondary to problem #1.  GI to advise when anticoagulation may be resumed.  4.  Hypertension Blood pressure is soft this morning with systolic of 782.  Continue to hold antihypertensive medications.  Place on IV fluids.  Follow.   5.  Well-controlled diabetes mellitus type 2 Hemoglobin A1c 6.0.  CBG of 127 this morning.  Continue to hold oral hypoglycemic agents.  Sliding scale insulin.  6.  Obstructive sleep apnea CPAP nightly.  7.  Cirrhosis Noted on CT abdomen and pelvis of 04/22/2019.  Hepatic serologies ordered.  Per GI.   DVT prophylaxis: SCDs Code Status: Full Family Communication: Updated patient.  No family at bedside Disposition Plan:  . Patient came from: Home            . Anticipated d/c place: Home . Barriers to d/c OR conditions which need to be met to effect a safe d/c: Home once bleeding has resolved, hemoglobin stable, tolerating regular diet, cleared by gastroenterology.   Consultants:   Gastroenterology: Dr. Benson Norway 05/03/2019  Procedures:   None  Antimicrobials:  None   Subjective: Patient sitting up in chair.  Patient states still ongoing bloody bowel movements.  Patient noted to have had about 5 bloody bowel movements since last night and this morning.  Patient denies any chest pain.  No shortness of breath.  No dizziness or lightheadedness.  Tolerating clears.  Concerned about getting his medications when he is made n.p.o.  Objective: Vitals:   05/03/19 1525 05/03/19 1954 05/03/19 2036 05/04/19 0531  BP: 110/72 103/81 102/68 102/67  Pulse: 64  67 70 65  Resp: 18 16 18 16   Temp: 99.1 F (37.3 C) 98.7 F (37.1 C) 97.9 F (36.6 C) 98.5 F (36.9 C)  TempSrc: Oral Oral Oral Oral  SpO2: 96% 98% 97% 96%  Weight:      Height:        Intake/Output Summary (Last 24 hours) at 05/04/2019 1231 Last data filed at 05/04/2019 0900 Gross per 24 hour  Intake 3376.35 ml  Output --  Net 3376.35 ml   Filed  Weights   05/02/19 1340 05/02/19 2351  Weight: 105.2 kg 105.2 kg    Examination:  General exam: Appears calm and comfortable  Respiratory system: CTAB.  No wheezes, no crackles, no rhonchi.  Normal respiratory effort.  Speaking in full sentences.  Cardiovascular system: Regular rate rhythm no murmurs rubs or gallops.  No JVD.  No lower extremity edema.  Gastrointestinal system: Abdomen is soft, nontender, nondistended, positive bowel sounds.  No rebound.  No guarding. Central nervous system: Alert and oriented. No focal neurological deficits. Extremities: Symmetric 5 x 5 power. Skin: No rashes, lesions or ulcers Psychiatry: Judgement and insight appear normal. Mood & affect appropriate.     Data Reviewed: I have personally reviewed following labs and imaging studies  CBC: Recent Labs  Lab 05/02/19 1342 05/03/19 0103 05/03/19 1544 05/04/19 0337  WBC 8.0 7.2  --  5.6  NEUTROABS  --   --   --  1.4*  HGB 13.7 12.1* 13.4 11.6*  HCT 42.5 37.0* 39.3 35.1*  MCV 91.8 90.9  --  91.2  PLT 194 171  --  384   Basic Metabolic Panel: Recent Labs  Lab 04/30/19 1200 05/02/19 1342 05/03/19 0103 05/04/19 0337  NA 136 136 136 140  K 4.7 4.4 4.4 4.3  CL 105 105 105 107  CO2 21* 23 23 25   GLUCOSE 151* 111* 134* 133*  BUN 14 15 14 11   CREATININE 1.13 1.06 1.06 1.06  CALCIUM 8.9 9.2 8.8* 8.5*  MG 2.3  --   --  2.2   GFR: Estimated Creatinine Clearance: 86.1 mL/min (by C-G formula based on SCr of 1.06 mg/dL). Liver Function Tests: Recent Labs  Lab 05/02/19 1342 05/03/19 0103  AST 62* 54*  ALT 95* 83*  ALKPHOS 89 71  BILITOT 1.1 1.3*  PROT 6.4* 5.7*  ALBUMIN 3.6 3.2*   No results for input(s): LIPASE, AMYLASE in the last 168 hours. No results for input(s): AMMONIA in the last 168 hours. Coagulation Profile: No results for input(s): INR, PROTIME in the last 168 hours. Cardiac Enzymes: No results for input(s): CKTOTAL, CKMB, CKMBINDEX, TROPONINI in the last 168 hours. BNP  (last 3 results) No results for input(s): PROBNP in the last 8760 hours. HbA1C: No results for input(s): HGBA1C in the last 72 hours. CBG: Recent Labs  Lab 05/03/19 1130 05/03/19 1707 05/03/19 2033 05/04/19 0741 05/04/19 1146  GLUCAP 157* 102* 184* 127* 123*   Lipid Profile: No results for input(s): CHOL, HDL, LDLCALC, TRIG, CHOLHDL, LDLDIRECT in the last 72 hours. Thyroid Function Tests: No results for input(s): TSH, T4TOTAL, FREET4, T3FREE, THYROIDAB in the last 72 hours. Anemia Panel: No results for input(s): VITAMINB12, FOLATE, FERRITIN, TIBC, IRON, RETICCTPCT in the last 72 hours. Sepsis Labs: No results for input(s): PROCALCITON, LATICACIDVEN in the last 168 hours.  Recent Results (from the past 240 hour(s))  Respiratory Panel by RT PCR (Flu A&B, Covid) - Nasopharyngeal Swab     Status: None   Collection Time: 05/02/19  7:26 PM   Specimen: Nasopharyngeal Swab  Result Value Ref Range Status   SARS Coronavirus 2 by RT PCR NEGATIVE NEGATIVE Final    Comment: (NOTE) SARS-CoV-2 target nucleic acids are NOT DETECTED. The SARS-CoV-2 RNA is generally detectable in upper respiratoy specimens during the acute phase of infection. The lowest concentration of SARS-CoV-2 viral copies this assay can detect is 131 copies/mL. A negative result does not preclude SARS-Cov-2 infection and should not be used as the sole basis for treatment or other patient management decisions. A negative result may occur with  improper specimen collection/handling, submission of specimen other than nasopharyngeal swab, presence of viral mutation(s) within the areas targeted by this assay, and inadequate number of viral copies (<131 copies/mL). A negative result must be combined with clinical observations, patient history, and epidemiological information. The expected result is Negative. Fact Sheet for Patients:  PinkCheek.be Fact Sheet for Healthcare Providers:    GravelBags.it This test is not yet ap proved or cleared by the [place] FDA and  has been authorized for detection and/or diagnosis of SARS-CoV-2 by FDA under an Emergency Use Authorization (EUA). This EUA will remain  in effect (meaning this test can be used) for the duration of the COVID-19 declaration under Section 564(b)(1) of the Act, 21 U.S.C. section 360bbb-3(b)(1), unless the authorization is terminated or revoked sooner.    Influenza A by PCR NEGATIVE NEGATIVE Final   Influenza B by PCR NEGATIVE NEGATIVE Final    Comment: (NOTE) The Xpert Xpress SARS-CoV-2/FLU/RSV assay is intended as an aid in  the diagnosis of influenza from Nasopharyngeal swab specimens and  should not be used as a sole basis for treatment. Nasal washings and  aspirates are unacceptable for Xpert Xpress SARS-CoV-2/FLU/RSV  testing. Fact Sheet for Patients: PinkCheek.be Fact Sheet for Healthcare Providers: GravelBags.it This test is not yet approved or cleared by the [place] FDA and  has been authorized for detection and/or diagnosis of SARS-CoV-2 by  FDA under an Emergency Use Authorization (EUA). This EUA will remain  in effect (meaning this test can be used) for the duration of the  Covid-19 declaration under Section 564(b)(1) of the Act, 21  U.S.C. section 360bbb-3(b)(1), unless the authorization is  terminated or revoked. Performed at [facility], [place] [address], [place], [place] [address]          Radiology Studies: No results found.      Scheduled Meds: . atorvastatin  80 mg Oral q1800  . cholecalciferol  2,000 Units Oral Daily  . clindamycin  1 application Topical BID  . dofetilide  500 mcg Oral BID  . insulin aspart  0-5 Units Subcutaneous QHS  . insulin aspart  0-9 Units Subcutaneous TID WC  . loratadine  10 mg Oral Daily  . multivitamin with minerals  1 tablet Oral Daily   . pantoprazole  40 mg Oral BID  . polyethylene glycol-electrolytes  4,000 mL Oral Once  . potassium chloride SA  20 mEq Oral Daily   Continuous Infusions: . sodium chloride       LOS: 0 days    Time spent: 35 minutes    Irine Seal, MD [facility]   To contact the attending provider between 7A-7P or the covering provider during after hours 7P-7A, please log into the web site [email or web address] and access using universal [place] password for that web site. If you do not have the password, please call the hospital operator.  05/04/2019, 12:31 PM

## 2019-05-05 ENCOUNTER — Inpatient Hospital Stay (HOSPITAL_COMMUNITY): Payer: BC Managed Care – PPO | Admitting: Anesthesiology

## 2019-05-05 ENCOUNTER — Encounter (HOSPITAL_COMMUNITY): Payer: Self-pay | Admitting: Internal Medicine

## 2019-05-05 ENCOUNTER — Encounter (HOSPITAL_COMMUNITY)
Admission: EM | Disposition: A | Discharge status: Home / Self Care | Payer: Self-pay | Source: Home / Self Care | Attending: Internal Medicine

## 2019-05-05 DIAGNOSIS — K922 Gastrointestinal hemorrhage, unspecified: Secondary | ICD-10-CM | POA: Diagnosis not present

## 2019-05-05 DIAGNOSIS — K625 Hemorrhage of anus and rectum: Secondary | ICD-10-CM | POA: Diagnosis not present

## 2019-05-05 DIAGNOSIS — K5733 Diverticulitis of large intestine without perforation or abscess with bleeding: Secondary | ICD-10-CM | POA: Diagnosis not present

## 2019-05-05 DIAGNOSIS — I480 Paroxysmal atrial fibrillation: Secondary | ICD-10-CM | POA: Diagnosis not present

## 2019-05-05 DIAGNOSIS — D123 Benign neoplasm of transverse colon: Secondary | ICD-10-CM | POA: Diagnosis not present

## 2019-05-05 DIAGNOSIS — K5730 Diverticulosis of large intestine without perforation or abscess without bleeding: Secondary | ICD-10-CM | POA: Diagnosis not present

## 2019-05-05 DIAGNOSIS — K5791 Diverticulosis of intestine, part unspecified, without perforation or abscess with bleeding: Secondary | ICD-10-CM | POA: Diagnosis not present

## 2019-05-05 DIAGNOSIS — D62 Acute posthemorrhagic anemia: Secondary | ICD-10-CM | POA: Diagnosis not present

## 2019-05-05 DIAGNOSIS — K921 Melena: Secondary | ICD-10-CM | POA: Diagnosis not present

## 2019-05-05 DIAGNOSIS — G4700 Insomnia, unspecified: Secondary | ICD-10-CM | POA: Diagnosis not present

## 2019-05-05 DIAGNOSIS — I25110 Atherosclerotic heart disease of native coronary artery with unstable angina pectoris: Secondary | ICD-10-CM | POA: Diagnosis not present

## 2019-05-05 DIAGNOSIS — D509 Iron deficiency anemia, unspecified: Secondary | ICD-10-CM | POA: Diagnosis not present

## 2019-05-05 DIAGNOSIS — K5731 Diverticulosis of large intestine without perforation or abscess with bleeding: Secondary | ICD-10-CM | POA: Diagnosis not present

## 2019-05-05 HISTORY — PX: COLONOSCOPY WITH PROPOFOL: SHX5780

## 2019-05-05 HISTORY — PX: POLYPECTOMY: SHX5525

## 2019-05-05 LAB — CBC
HCT: 38 % — ABNORMAL LOW (ref 39.0–52.0)
Hemoglobin: 12.3 g/dL — ABNORMAL LOW (ref 13.0–17.0)
MCH: 30 pg (ref 26.0–34.0)
MCHC: 32.4 g/dL (ref 30.0–36.0)
MCV: 92.7 fL (ref 80.0–100.0)
Platelets: 170 10*3/uL (ref 150–400)
RBC: 4.1 MIL/uL — ABNORMAL LOW (ref 4.22–5.81)
RDW: 14.6 % (ref 11.5–15.5)
WBC: 5.5 10*3/uL (ref 4.0–10.5)
nRBC: 0 % (ref 0.0–0.2)

## 2019-05-05 LAB — GLUCOSE, CAPILLARY
Glucose-Capillary: 94 mg/dL (ref 70–99)
Glucose-Capillary: 134 mg/dL — ABNORMAL HIGH (ref 70–99)

## 2019-05-05 LAB — BASIC METABOLIC PANEL
Anion gap: 8 (ref 5–15)
BUN: 10 mg/dL (ref 8–23)
CO2: 22 mmol/L (ref 22–32)
Calcium: 8.5 mg/dL — ABNORMAL LOW (ref 8.9–10.3)
Chloride: 107 mmol/L (ref 98–111)
Creatinine, Ser: 1.02 mg/dL (ref 0.61–1.24)
GFR calc Af Amer: >60 mL/min (ref >60)
GFR calc non Af Amer: >60 mL/min (ref >60)
Glucose, Bld: 113 mg/dL — ABNORMAL HIGH (ref 70–99)
Potassium: 4.1 mmol/L (ref 3.5–5.1)
Sodium: 137 mmol/L (ref 135–145)

## 2019-05-05 SURGERY — COLONOSCOPY WITH PROPOFOL
Anesthesia: Monitor Anesthesia Care

## 2019-05-05 MED ORDER — OLMESARTAN MEDOXOMIL 20 MG PO TABS: 20.0000 mg | ORAL_TABLET | Freq: Every day | ORAL | Status: DC

## 2019-05-05 MED ORDER — PROPOFOL 10 MG/ML IV BOLUS
INTRAVENOUS | Status: DC | PRN
  Administered 2019-05-05 (×2): 20 mg via INTRAVENOUS

## 2019-05-05 MED ORDER — RIVAROXABAN 20 MG PO TABS: 20.0000 mg | ORAL_TABLET | Freq: Every day | ORAL | Status: DC

## 2019-05-05 MED ORDER — PROPOFOL 500 MG/50ML IV EMUL
INTRAVENOUS | Status: DC | PRN
  Administered 2019-05-05: 100 ug/kg/min via INTRAVENOUS

## 2019-05-05 MED ORDER — ASPIRIN EC 81 MG PO TBEC: 81.0000 mg | DELAYED_RELEASE_TABLET | Freq: Every day | ORAL | 0 refills | Status: AC

## 2019-05-05 MED ORDER — LACTATED RINGERS IV SOLN
INTRAVENOUS | Status: DC
  Administered 2019-05-05: 09:00:00 1000 mL via INTRAVENOUS

## 2019-05-05 SURGICAL SUPPLY — 22 items

## 2019-05-05 NOTE — Progress Notes (Signed)
0000: Pt. had one clear liquid bowel movement at this time. Will continue to monitor.  0230: Pt. Reported having another clear liquid bowel movement around 0115. Was not witnessed by this RN. Will continue to monitor.

## 2019-05-05 NOTE — Anesthesia Preprocedure Evaluation (Signed)
Anesthesia Evaluation  Patient identified by MRN, date of birth, ID band Patient awake    Reviewed: Allergy & Precautions, NPO status , Patient's Chart, lab work & pertinent test results  Airway Mallampati: I  TM Distance: >3 FB Neck ROM: Full    Dental   Pulmonary sleep apnea ,    Pulmonary exam normal        Cardiovascular hypertension, Pt. on medications + CAD  Normal cardiovascular exam+ dysrhythmias Atrial Fibrillation      Neuro/Psych    GI/Hepatic   Endo/Other  diabetes, Type 2  Renal/GU      Musculoskeletal   Abdominal   Peds  Hematology   Anesthesia Other Findings   Reproductive/Obstetrics                             Anesthesia Physical Anesthesia Plan  ASA: III  Anesthesia Plan: MAC   Post-op Pain Management:    Induction:   PONV Risk Score and Plan: 1 and Treatment may vary due to age or medical condition  Airway Management Planned: Nasal Cannula  Additional Equipment:   Intra-op Plan:   Post-operative Plan:   Informed Consent: I have reviewed the patients History and Physical, chart, labs and discussed the procedure including the risks, benefits and alternatives for the proposed anesthesia with the patient or authorized representative who has indicated his/her understanding and acceptance.       Plan Discussed with: CRNA and Surgeon  Anesthesia Plan Comments:         Anesthesia Quick Evaluation

## 2019-05-05 NOTE — Discharge Summary (Signed)
Physician Discharge Summary  Carlos Gutierrez [id number] DOB: 03/09/1951 DOA: 05/02/2019  PCP: London Pepper, MD  Admit date: 05/02/2019 Discharge date: 05/05/2019  Time spent: 50 minutes  Recommendations for Outpatient Follow-up:  1. Follow-up with Dr. Benson Norway, gastroenterology in 2 weeks. 2. Follow-up with London Pepper, MD in 2 weeks.  On follow-up patient need a CBC done to follow-up on H&H.   Discharge Diagnoses:  Principal Problem:   Diverticulosis of intestine with bleeding Active Problems:   Lower GI bleed   Hematochezia   Atrial fibrillation (HCC)   OSA (obstructive sleep apnea)   HTN (hypertension)   Type 2 diabetes mellitus without complication, without long-term current use of insulin (HCC)   Acute blood loss anemia   Discharge Condition: Stable and improved  Diet recommendation: Heart healthy  Filed Weights   05/02/19 1340 05/02/19 2351 05/05/19 0907  Weight: 105.2 kg 105.2 kg 105.2 kg    History of present illness:  HPI per Dr. Lucretia Field is a 70 y.o. male with medical history significant of A.Fib on Xarelto, non-occlusive CAD.  Patient presented to ED after 3 bloody BMs the morning of admission.  Recently treated with course of amoxicillin empirically for diverticulitis diagnosed clinically (not seen on CT), finished ABx the morning of admission.  Abd pain and fever improved with ABx, but bleeding started this morning.  No CP, SOB, syncope, lightheadedness.   ED Course: HGB nl, EDP wants obs for diverticular bleed.  Hospital Course:  1 lower GI bleed secondary to diverticular bleed/hematochezia Likely secondary to lower GI bleed secondary to diverticular bleed.  Patient recently treated for a bout of acute diverticulitis presented with bright red blood per rectum.  Patient noted to have ongoing bloody bowel movements during the hospitalization which seem to have subsided by day of discharge.  Patient's hemoglobin fluctuated and  stabilized at 12.3 by day of discharge from 13.7 on admission.  GI was consulted and patient seen in consultation by Dr. Benson Norway of gastroenterology.  Patient underwent a colonoscopy and no overt bleeding noted on colonoscopy.  Colonoscopy did show a 3 mm polyp in the transverse colon that was removed and diverticulosis.  Patient improved clinically was placed on a regular diet and was cleared by GI for discharge.  Patient will follow up with GI in the outpatient setting in 2 weeks.  Per GI patient may resume Xarelto on Monday, 05/07/2019.    2.  Acute blood loss anemia Secondary to problem #1.  Hemoglobin remained stable at 12.3 by day of discharge from 13.7 on admission.  Patient was followed by GI and underwent colonoscopy on 05/05/2019.  No noted bleeding on colonoscopy.  Outpatient follow-up with PCP and GI.    3.  Paroxysmal atrial fibrillation CHA2DS2VASC score 4 Remained rate controlled on home regimen of Tikosyn.  In normal sinus rhythm.  Anticoagulation was held secondary to problem #1.    Per GI patient to resume anticoagulation on Monday, 05/07/2019.  Outpatient follow-up with cardiology as previously scheduled.    4.  Hypertension Blood pressure noted to be soft during the hospitalization and as such patient's antihypertensive medications were held.  Patient hydrated with IV fluids.  Antihypertensive medications will be resumed 2 days post discharge.  Outpatient follow-up with PCP.    5.  Well-controlled diabetes mellitus type 2 Hemoglobin A1c 6.0.  Patient's oral hypoglycemic agents were held throughout the hospitalization and patient maintained on sliding scale insulin.  Oral hypoglycemic agents will be resumed on discharge.  6.  Obstructive sleep apnea CPAP nightly.  7.  Cirrhosis Noted on CT abdomen and pelvis of 04/22/2019.  Hepatic serologies ordered with negative hepatitis B surface antigen and antibody, and negative hepatitis C antibody.  Outpatient follow-up with  GI.  Procedures:  Colonoscopy per Dr. Benson Norway 05/05/2019  Consultations:  Gastroenterology: Dr. Benson Norway 05/03/2019   Discharge Exam: Vitals:   05/05/19 1115 05/05/19 1431  BP: 136/79 107/78  Pulse: 61 (!) 57  Resp: 20 18  Temp: 97.8 F (36.6 C) 98.3 F (36.8 C)  SpO2: 100% 97%    General: NAD Cardiovascular: RRR Respiratory: CTAB  Discharge Instructions   Discharge Instructions    Diet - low sodium heart healthy   Complete by: As directed    Increase activity slowly   Complete by: As directed      Allergies as of 05/05/2019      Reactions   Metformin And Related Nausea And Vomiting, Other (See Comments)   Dizziness and disorientation, too      Medication List    TAKE these medications   acetaminophen 500 MG tablet Commonly known as: TYLENOL Take 1,000 mg by mouth as needed for mild pain, moderate pain or headache.   aspirin EC 81 MG tablet Take 1 tablet (81 mg total) by mouth daily. Start taking on: May 07, 2019 What changed: These instructions start on May 07, 2019. If you are unsure what to do until then, ask your doctor or other care provider. Notes to patient: SEE INSTRUCTION   atorvastatin 80 MG tablet Commonly known as: LIPITOR Take 1 tablet (80 mg total) by mouth daily at 6 PM. Notes to patient: Today at 6pm   cetirizine 10 MG tablet Commonly known as: ZYRTEC Take 10 mg by mouth at bedtime as needed for allergies.   clindamycin 1 % external solution Commonly known as: CLEOCIN T Apply 1 application topically See admin instructions. Apply to face two times a day Notes to patient: Per home dose   dofetilide 500 MCG capsule Commonly known as: TIKOSYN Take 1 capsule (500 mcg total) by mouth 2 (two) times daily. Notes to patient: Tonight at 6pm   glipiZIDE 2.5 MG 24 hr tablet Commonly known as: GLUCOTROL XL Take 2.5 mg by mouth daily with breakfast. Notes to patient: With breakfast   loperamide 2 MG tablet Commonly known as: IMODIUM  A-D Take 2 mg by mouth as needed for diarrhea or loose stools.   Melatonin 5 MG Caps Take 5 mg by mouth at bedtime as needed (sleep).   multivitamin with minerals tablet Take 1 tablet by mouth daily. Notes to patient: Tomorrow aT 10AM   nitroGLYCERIN 0.4 MG SL tablet Commonly known as: NITROSTAT Place 1 tablet (0.4 mg total) under the tongue every 5 (five) minutes as needed. What changed: reasons to take this   olmesartan 20 MG tablet Commonly known as: BENICAR Take 1 tablet (20 mg total) by mouth daily. Start taking on: May 07, 2019 What changed: These instructions start on May 07, 2019. If you are unsure what to do until then, ask your doctor or other care provider. Notes to patient: SEE INSTRUCTION   pantoprazole 40 MG tablet Commonly known as: PROTONIX TAKE 1 TABLET BY MOUTH TWICE DAILY. Notes to patient: TONIGHT AT 9PM   potassium chloride SA 20 MEQ tablet Commonly known as: KLOR-CON Take 1 tablet (20 mEq total) by mouth daily. Notes to patient: TOMORROW AT 10AM   rivaroxaban 20 MG Tabs tablet Commonly known as: Xarelto  Take 1 tablet (20 mg total) by mouth daily with supper. Start taking on: May 07, 2019 What changed:   how much to take  how to take this  when to take this  additional instructions  These instructions start on May 07, 2019. If you are unsure what to do until then, ask your doctor or other care provider. Notes to patient: SEE INSTRUCTION   vitamin C 1000 MG tablet Take 1,000 mg by mouth daily. Notes to patient: PER HOME DOSE   Vitamin D 50 MCG (2000 UT) tablet Take 2,000 Units by mouth daily. Notes to patient: TOMORROW      Allergies  Allergen Reactions  . Metformin And Related Nausea And Vomiting and Other (See Comments)    Dizziness and disorientation, too   Follow-up Information    London Pepper, MD. Schedule an appointment as soon as possible for a visit in 2 week(s).   Specialty: Family Medicine Contact  information: [place] [address] [phone]        Belva Crome, MD .   Specialty: Cardiology Contact information: [phone] [address] [place] [address] [phone]        Evans Lance, MD .   Specialty: Cardiology Contact information: [phone] [address] [place] [address] [phone]        [name], MD. Schedule an appointment as soon as possible for a visit in 2 week(s).   Specialty: Gastroenterology Contact information: [address] [place] [address] [phone]            The results of significant diagnostics from this hospitalization (including imaging, microbiology, ancillary and laboratory) are listed below for reference.    Significant Diagnostic Studies: X-ray chest PA and lateral  Result Date: 04/17/2019 CLINICAL DATA:  69 year old male with shortness of breath. EXAM: CHEST - 2 VIEW COMPARISON:  Chest radiograph dated 09/21/2016. FINDINGS: Minimal bibasilar linear atelectasis/scarring. No focal consolidation, pleural effusion, pneumothorax. The cardiac silhouette is within normal limits. No acute osseous pathology. IMPRESSION: No active cardiopulmonary disease. Electronically Signed   By: Anner Crete M.D.   On: 04/17/2019 15:40   CT ABDOMEN PELVIS W CONTRAST  Result Date: 04/22/2019 CLINICAL DATA:  Diverticulitis. Left lower quadrant pain. Fever. EXAM: CT ABDOMEN AND PELVIS WITH CONTRAST TECHNIQUE: Multidetector CT imaging of the abdomen and pelvis was performed using the standard protocol following bolus administration of intravenous contrast. CONTRAST:  150mL OMNIPAQUE IOHEXOL 300 MG/ML  SOLN COMPARISON:  None. FINDINGS: Lower chest: The lung bases are clear. The heart size is normal. Hepatobiliary: The liver surface appears somewhat nodular. Cholelithiasis without acute inflammation.There is no biliary ductal dilation. Pancreas: Normal contours  without ductal dilatation. No peripancreatic fluid collection. Spleen: The spleen is enlarged measuring approximately 15 cm craniocaudad (coronal series 6, image 74). Adrenals/Urinary Tract: --Adrenal glands: No adrenal hemorrhage. --Right kidney/ureter: No hydronephrosis or perinephric hematoma. --Left kidney/ureter: No hydronephrosis or perinephric hematoma. --Urinary bladder: Unremarkable. Stomach/Bowel: --Stomach/Duodenum: No hiatal hernia or other gastric abnormality. Normal duodenal course and caliber. --Small bowel: No dilatation or inflammation. --Colon: There are scattered colonic diverticula without CT evidence for diverticulitis. --Appendix: Normal. Vascular/Lymphatic: Atherosclerotic calcification is present within the non-aneurysmal abdominal aorta, without hemodynamically significant stenosis. --No retroperitoneal lymphadenopathy. --No mesenteric lymphadenopathy. --No pelvic or inguinal lymphadenopathy. Reproductive: The prostate gland is enlarged. Other: No ascites or free air. The abdominal wall is normal. Musculoskeletal. No acute displaced fractures. IMPRESSION: 1. Scattered colonic diverticula without evidence of  diverticulitis. 2. 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 3. Cholelithiasis without evidence of acute cholecystitis. 4. Prostatomegaly. 5. Aortic Atherosclerosis (ICD10-I70.0). Electronically Signed   By: Constance Holster M.D.   On: 04/22/2019 01:16   CARDIAC CATHETERIZATION  Addendum Date: 04/18/2019    30 to 40% distal left main, which is new/progressed from 2018.  Luminal irregularities in the LAD mid and distal.  First obtuse marginal 50% tandem proximal to mid stenoses.  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  Unchanged compared to prior images.  Normal left ventricular function with EF 55%.  LVEDP normal.  Atrial fibrillation developed during the  night.  He is asymptomatic and did not know he was in atrial fib. Recommendations:  Continue aggressive risk factor modification.  Increase statin intensity to max dose atorvastatin, 80 mg/day.  Consider adding antiarrhythmic therapy continuously to control atrial for rhythm.  Perhaps some of his symptoms are related to undetected atrial fibrillation.  Result Date: 04/18/2019  30 to 40% distal left main  Luminal irregularities in the LAD mid and distal.  First obtuse marginal 50% tandem proximal to mid stenoses.  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  Unchanged compared to prior images.  Normal left ventricular function with EF 55%.  LVEDP normal.  Atrial fibrillation developed during the night.  He is asymptomatic and did not know he was in atrial fib. Recommendations:  Continue aggressive risk factor modification.  Increase statin intensity to max dose atorvastatin, 80 mg/day.  Consider adding antiarrhythmic therapy continuously to control atrial for rhythm.  Perhaps some of his symptoms are related to undetected atrial fibrillation.  ECHOCARDIOGRAM COMPLETE  Result Date: 04/18/2019   ECHOCARDIOGRAM REPORT   Patient Name:   RIDHA DELLIGATTI Bernhart Date of Exam: 04/18/2019 Medical Rec #:  [id number]        Height:       73.0 in Accession #:    [id number]       Weight:       229.3 lb Date of Birth:  December 19, 1950        BSA:          2.28 m Patient Age:    59 years         BP:           117/77 mmHg Patient Gender: M                HR:           66 bpm. Exam Location:  Inpatient Procedure: 2D Echo, Color Doppler, Cardiac Doppler and Intracardiac            Opacification Agent Indications:    R06.9 DOE; I48.91* Unspecified atrial fibrillation; R07.9* Chest                 pain, unspecified  History:        Patient has prior history of Echocardiogram examinations, most                 recent 09/21/2016. CAD, Arrythmias:Atrial Fibrillation; Risk                  Factors:Hypertension, Diabetes, Sleep Apnea and Dyslipidemia.  Sonographer:    Raquel Sarna Senior RDCS Referring Phys: [place]  1. Left ventricular ejection fraction, by visual estimation, is 50 to 55%. The left  ventricle has normal function. There is no left ventricular hypertrophy.  2. Definity contrast agent was given IV to delineate the left ventricular endocardial borders.  3. The left ventricle has no regional wall motion abnormalities.  4. Left ventricular diastolic parameters are indeterminate.  5. Global right ventricle has normal systolic function.The right ventricular size is not well visualized. Right vetricular wall thickness was not assessed.  6. Left atrial size was normal.  7. Right atrial size was normal.  8. The mitral valve is normal in structure. No evidence of mitral valve regurgitation. No evidence of mitral stenosis.  9. The tricuspid valve is normal in structure. Tricuspid valve regurgitation is trivial. 10. The aortic valve is grossly normal. Aortic valve regurgitation is not visualized. No evidence of aortic valve sclerosis or stenosis. 11. The pulmonic valve was normal in structure. Pulmonic valve regurgitation is trivial. 12. The inferior vena cava is normal in size with greater than 50% respiratory variability, suggesting right atrial pressure of 3 mmHg. 13. TR signal is inadequate for assessing pulmonary artery systolic pressure. FINDINGS  Left Ventricle: Left ventricular ejection fraction, by visual estimation, is 50 to 55%. The left ventricle has normal function. Definity contrast agent was given IV to delineate the left ventricular endocardial borders. The left ventricle has no regional wall motion abnormalities. There is no left ventricular hypertrophy. Left ventricular diastolic parameters are indeterminate. Right Ventricle: The right ventricular size is not well visualized. Right vetricular wall thickness was not assessed. Global RV systolic function is has  normal systolic function. Left Atrium: Left atrial size was normal in size. Right Atrium: Right atrial size was normal in size Pericardium: There is no evidence of pericardial effusion. Mitral Valve: The mitral valve is normal in structure. No evidence of mitral valve regurgitation. No evidence of mitral valve stenosis by observation. Tricuspid Valve: The tricuspid valve is normal in structure. Tricuspid valve regurgitation is trivial. Aortic Valve: The aortic valve is grossly normal. Aortic valve regurgitation is not visualized. The aortic valve is structurally normal, with no evidence of sclerosis or stenosis. Pulmonic Valve: The pulmonic valve was normal in structure. Pulmonic valve regurgitation is trivial. Pulmonic regurgitation is trivial. Aorta: The aortic root, ascending aorta and aortic arch are all structurally normal, with no evidence of dilitation or obstruction. Venous: The inferior vena cava was not well visualized. The inferior vena cava is normal in size with greater than 50% respiratory variability, suggesting right atrial pressure of 3 mmHg. IAS/Shunts: The interatrial septum was not well visualized. 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  LEFT VENTRICLE PLAX 2D LVIDd:         4.90 cm LVIDs:         4.10 cm LV PW:         1.00 cm LV IVS:        1.00 cm LVOT diam:     2.10 cm LV SV:         39 ml LV SV Index:   16.51 LVOT Area:     3.46 cm  RIGHT VENTRICLE RV S prime:     10.20 cm/s TAPSE (M-mode): 1.7 cm LEFT ATRIUM             Index       RIGHT ATRIUM           Index LA diam:        3.70 cm 1.62 cm/m  RA Area:     19.30 cm LA Vol (A2C):  52.3 ml 22.94 ml/m RA Volume:   57.50 ml  25.22 ml/m LA Vol (A4C):   54.2 ml 23.77 ml/m LA Biplane Vol: 53.4 ml 23.42 ml/m  AORTIC VALVE LVOT Vmax:   77.68 cm/s LVOT Vmean:  53.250 cm/s LVOT VTI:    0.130 m  AORTA Ao Root diam: 3.70 cm Ao Asc diam:  3.40 cm  SHUNTS Systemic VTI:  0.13 m Systemic Diam: 2.10 cm  Cherlynn Kaiser  MD Electronically signed by Cherlynn Kaiser MD Signature Date/Time: 04/18/2019/1:18:33 PM    Final     Microbiology: Recent Results (from the past 240 hour(s))  Respiratory Panel by RT PCR (Flu A&B, Covid) - Nasopharyngeal Swab     Status: None   Collection Time: 05/02/19  7:26 PM   Specimen: Nasopharyngeal Swab  Result Value Ref Range Status   SARS Coronavirus 2 by RT PCR NEGATIVE NEGATIVE Final    Comment: (NOTE) SARS-CoV-2 target nucleic acids are NOT DETECTED. The SARS-CoV-2 RNA is generally detectable in upper respiratoy specimens during the acute phase of infection. The lowest concentration of SARS-CoV-2 viral copies this assay can detect is 131 copies/mL. A negative result does not preclude SARS-Cov-2 infection and should not be used as the sole basis for treatment or other patient management decisions. A negative result may occur with  improper specimen collection/handling, submission of specimen other than nasopharyngeal swab, presence of viral mutation(s) within the areas targeted by this assay, and inadequate number of viral copies (<131 copies/mL). A negative result must be combined with clinical observations, patient history, and epidemiological information. The expected result is Negative. Fact Sheet for Patients:  PinkCheek.be Fact Sheet for Healthcare Providers:  GravelBags.it This test is not yet ap proved or cleared by the [place] FDA and  has been authorized for detection and/or diagnosis of SARS-CoV-2 by FDA under an Emergency Use Authorization (EUA). This EUA will remain  in effect (meaning this test can be used) for the duration of the COVID-19 declaration under Section 564(b)(1) of the Act, 21 U.S.C. section 360bbb-3(b)(1), unless the authorization is terminated or revoked sooner.    Influenza A by PCR NEGATIVE NEGATIVE Final   Influenza B by PCR NEGATIVE NEGATIVE Final    Comment: (NOTE) The  Xpert Xpress SARS-CoV-2/FLU/RSV assay is intended as an aid in  the diagnosis of influenza from Nasopharyngeal swab specimens and  should not be used as a sole basis for treatment. Nasal washings and  aspirates are unacceptable for Xpert Xpress SARS-CoV-2/FLU/RSV  testing. Fact Sheet for Patients: PinkCheek.be Fact Sheet for Healthcare Providers: GravelBags.it This test is not yet approved or cleared by the [place] FDA and  has been authorized for detection and/or diagnosis of SARS-CoV-2 by  FDA under an Emergency Use Authorization (EUA). This EUA will remain  in effect (meaning this test can be used) for the duration of the  Covid-19 declaration under Section 564(b)(1) of the Act, 21  U.S.C. section 360bbb-3(b)(1), unless the authorization is  terminated or revoked. Performed at [facility], [place] [address] [place], [place] [address]      Labs: Basic Metabolic Panel: Recent Labs  Lab 04/30/19 1200 05/02/19 1342 05/03/19 0103 05/04/19 0337 05/05/19 0234  NA 136 136 136 140 137  K 4.7 4.4 4.4 4.3 4.1  CL 105 105 105 107 107  CO2 21* 23 23 25 22   GLUCOSE 151* 111* 134* 133* 113*  BUN 14 15 14 11 10   CREATININE 1.13 1.06 1.06 1.06 1.02  CALCIUM 8.9  9.2 8.8* 8.5* 8.5*  MG 2.3  --   --  2.2  --    Liver Function Tests: Recent Labs  Lab 05/02/19 1342 05/03/19 0103  AST 62* 54*  ALT 95* 83*  ALKPHOS 89 71  BILITOT 1.1 1.3*  PROT 6.4* 5.7*  ALBUMIN 3.6 3.2*   No results for input(s): LIPASE, AMYLASE in the last 168 hours. No results for input(s): AMMONIA in the last 168 hours. CBC: Recent Labs  Lab 05/02/19 1342 05/02/19 1342 05/03/19 0103 05/03/19 1544 05/04/19 0337 05/04/19 1523 05/05/19 0234  WBC 8.0  --  7.2  --  5.6  --  5.5  NEUTROABS  --   --   --   --  1.4*  --   --   HGB 13.7   < > 12.1* 13.4 11.6* 12.7* 12.3*  HCT 42.5   < > 37.0* 39.3 35.1* 39.9 38.0*  MCV 91.8  --  90.9  --   91.2  --  92.7  PLT 194  --  171  --  162  --  170   < > = values in this interval not displayed.   Cardiac Enzymes: No results for input(s): CKTOTAL, CKMB, CKMBINDEX, TROPONINI in the last 168 hours. BNP: BNP (last 3 results) Recent Labs    04/17/19 1626  BNP 46.2    ProBNP (last 3 results) No results for input(s): PROBNP in the last 8760 hours.  CBG: Recent Labs  Lab 05/04/19 1146 05/04/19 1652 05/04/19 2112 05/05/19 0708 05/05/19 1225  GLUCAP 123* 131* 91 94 134*       Signed:  Irine Seal MD.  [facility] 05/05/2019, 3:29 PM

## 2019-05-05 NOTE — Progress Notes (Signed)
Carlos Gutierrez to be D/C'd  per MD order. Discussed with the patient and all questions fully answered.  VSS, Skin clean, dry and intact without evidence of skin break down, no evidence of skin tears noted.  IV catheter discontinued intact. Site without signs and symptoms of complications. Dressing and pressure applied.  An After Visit Summary was printed and given to the patient. Patient received prescription.  D/c education completed with patient/family including follow up instructions, medication list, d/c activities limitations if indicated, with other d/c instructions as indicated by MD - patient able to verbalize understanding, all questions fully answered.   Patient instructed to return to ED, call 911, or call MD for any changes in condition.   Patient to be escorted via [place], and D/C home via private auto.

## 2019-05-05 NOTE — Progress Notes (Addendum)
Called by NT to report that pt. was vomiting in the [facility]. Administered Zofran and informed pt. That he could stop drinking the bowel prep since he is vomiting. Pt. refused stated that he thinks he just tried to drink too much. Pt. stated he would try and drink some more after the Zofran started working. Will continue to monitor.

## 2019-05-05 NOTE — Op Note (Signed)
[facility] Patient Name: Carlos Gutierrez Procedure Date : 05/05/2019 MRN: [id number] Attending MD: [name] , MD Date of Birth: 03-27-1950 CSN: [id number] Age: 69 Admit Type: Inpatient Procedure:                Colonoscopy Indications:              Hematochezia Providers:                [name], MD, Glori Bickers, RN, Marguerita Merles, Technician Referring MD:              Medicines:                Propofol per Anesthesia Complications:            No immediate complications. Estimated Blood Loss:     Estimated blood loss: none. Procedure:                Pre-Anesthesia Assessment:                           - Prior to the procedure, a History and Physical                            was performed, and patient medications and                            allergies were reviewed. The patient's tolerance of                            previous anesthesia was also reviewed. The risks                            and benefits of the procedure and the sedation                            options and risks were discussed with the patient.                            All questions were answered, and informed consent                            was obtained. Prior Anticoagulants: The patient has                            taken Xarelto (rivaroxaban), last dose was 3 days                            prior to procedure. ASA Grade Assessment: II - A                            patient with mild systemic disease. After reviewing                            the  risks and benefits, the patient was deemed in                            satisfactory condition to undergo the procedure.                           - Sedation was administered by an anesthesia                            professional. Deep sedation was attained.                           After obtaining informed consent, the colonoscope                            was passed under direct vision. Throughout  the                            procedure, the patient's blood pressure, pulse, and                            oxygen saturations were monitored continuously. The                            [id number] Olympus pediatric colonoscope                            was introduced through the anus and advanced to the                            the cecum, identified by appendiceal orifice and                            ileocecal valve. The colonoscopy was performed                            without difficulty. The patient tolerated the                            procedure well. The quality of the bowel                            preparation was good. The ileocecal valve,                            appendiceal orifice, and rectum were photographed. Scope In: 9:42:29 AM Scope Out: 9:57:03 AM Scope Withdrawal Time: 0 hours 11 minutes 27 seconds  Total Procedure Duration: 0 hours 14 minutes 34 seconds  Findings:      A 3 mm polyp was found in the transverse colon. The polyp was sessile.       The polyp was removed with a cold snare. Resection and retrieval were       complete.      Scattered small and large-mouthed diverticula were found in the sigmoid  colon. Impression:               - One 3 mm polyp in the transverse colon, removed                            with a cold snare. Resected and retrieved.                           - Diverticulosis in the sigmoid colon. Recommendation:           - Return patient to hospital ward for ongoing care.                           - Resume regular diet.                           - Continue present medications.                           - Await pathology results.                           - Repeat colonoscopy in 7 years for surveillance.                           - Resume anticoagulation on Monday.                           - Okay to D/C home.                           - Follow up in the office in two weeks. Procedure Code(s):        ---  Professional ---                           [phone], Colonoscopy, flexible; with removal of                            tumor(s), polyp(s), or other lesion(s) by snare                            technique Diagnosis Code(s):        --- Professional ---                           K63.5, Polyp of colon                           K92.1, Melena (includes Hematochezia)                           K57.30, Diverticulosis of large intestine without                            perforation or abscess without bleeding CPT copyright 2019 American Medical Association. All rights reserved. The codes documented in this report are preliminary and upon coder review may  be  revised to meet current compliance requirements. [name], MD [name], MD 05/05/2019 10:10:35 AM This report has been signed electronically. Number of Addenda: 0

## 2019-05-05 NOTE — Plan of Care (Signed)

## 2019-05-05 NOTE — Progress Notes (Signed)
DR. Conrad [place] by to see patient. Pt with bradycardia at times. Looks like dropping a beat.  BP 130/72 consistently stable. Pt states he feels fine and does not feel bad at all. Dr. Conrad  just wants patient to stay in recovery for a little bit longer to watch. Pt states his heart rate generally runs on the lower side. Will continue to monitor

## 2019-05-05 NOTE — Transfer of Care (Signed)
Immediate Anesthesia Transfer of Care Note  Patient: Carlos Gutierrez  Procedure(s) Performed: COLONOSCOPY WITH PROPOFOL (N/A ) POLYPECTOMY  Patient Location: Endoscopy Unit  Anesthesia Type:MAC  Level of Consciousness: awake, alert  and oriented  Airway & Oxygen Therapy: Patient Spontanous Breathing  Post-op Assessment: Report given to RN and Post -op Vital signs reviewed and stable  Post vital signs: Reviewed and stable  Last Vitals:  Vitals Value Taken Time  BP 97/54 05/05/19 1004  Temp    Pulse 58 05/05/19 1004  Resp 16 05/05/19 1004  SpO2 99 % 05/05/19 1004  Vitals shown include unvalidated device data.  Last Pain:  Vitals:   05/05/19 0907  TempSrc: Oral  PainSc: 0-No pain         Complications: No apparent anesthesia complications

## 2019-05-05 NOTE — Plan of Care (Signed)

## 2019-05-05 NOTE — Progress Notes (Signed)
1055 dr. Conrad [place] by to see pt. His rhythm is same. Looks like strips he had scanned last night similar brady cardia. He is good with pt going back to floor

## 2019-05-05 NOTE — Anesthesia Postprocedure Evaluation (Signed)
Anesthesia Post Note  Patient: Carlos Gutierrez  Procedure(s) Performed: COLONOSCOPY WITH PROPOFOL (N/A ) POLYPECTOMY     Patient location during evaluation: PACU Anesthesia Type: MAC Level of consciousness: awake and alert Pain management: pain level controlled Vital Signs Assessment: post-procedure vital signs reviewed and stable Respiratory status: spontaneous breathing, nonlabored ventilation, respiratory function stable and patient connected to nasal cannula oxygen Cardiovascular status: stable and blood pressure returned to baseline Postop Assessment: no apparent nausea or vomiting Anesthetic complications: no    Last Vitals:  Vitals:   05/05/19 1055 05/05/19 1115  BP: 135/72 136/79  Pulse: 62 61  Resp: 19 20  Temp:  36.6 C  SpO2: 99% 100%    Last Pain:  Vitals:   05/05/19 1115  TempSrc: Oral  PainSc:                  Carlos Gutierrez

## 2019-05-06 ENCOUNTER — Ambulatory Visit: Payer: BC Managed Care – PPO | Attending: Internal Medicine

## 2019-05-06 DIAGNOSIS — Z23 Encounter for immunization: Secondary | ICD-10-CM | POA: Insufficient documentation

## 2019-05-06 NOTE — Progress Notes (Signed)
   Covid-19 Vaccination Clinic  Name:  Carlos Gutierrez    MRN: [id number] DOB: August 15, 1950  05/06/2019  [name] was observed post Covid-19 immunization for 15 minutes without incidence. He was provided with Vaccine Information Sheet and instruction to access the V-Safe system.   [name] was instructed to call 911 with any severe reactions post vaccine: [name] Difficulty breathing  . Swelling of your face and throat  . A fast heartbeat  . A bad rash all over your body  . Dizziness and weakness    Immunizations Administered    Name Date Dose VIS Date Route   Pfizer COVID-19 Vaccine 05/06/2019  3:10 PM 0.3 mL 02/23/2019 Intramuscular   Manufacturer: [place]   Lot: [id number]   [place]: [id number]

## 2019-05-08 LAB — SURGICAL PATHOLOGY

## 2019-05-11 DIAGNOSIS — Z0000 Encounter for general adult medical examination without abnormal findings: Secondary | ICD-10-CM | POA: Diagnosis not present

## 2019-05-11 DIAGNOSIS — Z13220 Encounter for screening for lipoid disorders: Secondary | ICD-10-CM | POA: Diagnosis not present

## 2019-05-11 DIAGNOSIS — Z79899 Other long term (current) drug therapy: Secondary | ICD-10-CM | POA: Diagnosis not present

## 2019-05-11 DIAGNOSIS — Z125 Encounter for screening for malignant neoplasm of prostate: Secondary | ICD-10-CM | POA: Diagnosis not present

## 2019-05-11 DIAGNOSIS — Z09 Encounter for follow-up examination after completed treatment for conditions other than malignant neoplasm: Secondary | ICD-10-CM | POA: Diagnosis not present

## 2019-05-11 DIAGNOSIS — E559 Vitamin D deficiency, unspecified: Secondary | ICD-10-CM | POA: Diagnosis not present

## 2019-05-11 DIAGNOSIS — E785 Hyperlipidemia, unspecified: Secondary | ICD-10-CM | POA: Diagnosis not present

## 2019-05-11 DIAGNOSIS — I4891 Unspecified atrial fibrillation: Secondary | ICD-10-CM | POA: Diagnosis not present

## 2019-05-11 DIAGNOSIS — I10 Essential (primary) hypertension: Secondary | ICD-10-CM | POA: Diagnosis not present

## 2019-05-14 ENCOUNTER — Telehealth: Payer: Self-pay | Admitting: Cardiology

## 2019-05-14 NOTE — Telephone Encounter (Signed)
   Spoke with this patient on the phone this evening and he reports that he has been in AF today. He was recently transitioned to Tikosyn from Flecainide. He was instructed to not stop the Tikosyn. His rate is controlled at 57bpm with no other complaints such as SOB or fluid volume overload. He has an appointment on 05/23/19 with Dr. Lovena Le. He is interested in coming in this week if we could get him an appointment on Dr. Lovena Le or APP schedule or in the AF clinic. Contacted scheduling team as well as Malka So, PA to see if there are available appointments this week.   Kathyrn Drown NP-C [place] Pager: [phone]

## 2019-05-23 ENCOUNTER — Ambulatory Visit: Payer: BC Managed Care – PPO | Admitting: Internal Medicine

## 2019-05-23 ENCOUNTER — Other Ambulatory Visit: Payer: Self-pay

## 2019-05-23 ENCOUNTER — Encounter: Payer: Self-pay | Admitting: Internal Medicine

## 2019-05-23 VITALS — BP 124/78 | HR 65 | Ht 79.0 in | Wt 226.6 lb

## 2019-05-23 DIAGNOSIS — I480 Paroxysmal atrial fibrillation: Secondary | ICD-10-CM | POA: Diagnosis not present

## 2019-05-23 DIAGNOSIS — K7460 Unspecified cirrhosis of liver: Secondary | ICD-10-CM | POA: Diagnosis not present

## 2019-05-23 DIAGNOSIS — K5731 Diverticulosis of large intestine without perforation or abscess with bleeding: Secondary | ICD-10-CM | POA: Diagnosis not present

## 2019-05-23 NOTE — Progress Notes (Signed)
HPI Mr. Hefter returns today for followup of PAF. He is a pleasant 69 yo man with a h/o persistent atrial fib who was admitted a couple of months ago for initiation of dofetilide. He has mostly done well. He had a single episode where his atrial fib recurred and then stopped spontaneously. He denies chest pain, sob or syncope. He has missed a couple of doses of his dofetilide.  Allergies  Allergen Reactions  . Metformin And Related Nausea And Vomiting and Other (See Comments)    Dizziness and disorientation, too     Current Outpatient Medications  Medication Sig Dispense Refill  . acetaminophen (TYLENOL) 500 MG tablet Take 1,000 mg by mouth as needed for mild pain, moderate pain or headache.     . Ascorbic Acid (VITAMIN C) 1000 MG tablet Take 1,000 mg by mouth daily.    [name] aspirin EC 81 MG tablet Take 1 tablet (81 mg total) by mouth daily. 30 tablet 0  . atorvastatin (LIPITOR) 80 MG tablet Take 1 tablet (80 mg total) by mouth daily at 6 PM. 60 tablet 2  . cetirizine (ZYRTEC) 10 MG tablet Take 10 mg by mouth at bedtime as needed for allergies.    . Cholecalciferol (VITAMIN D) 50 MCG (2000 UT) tablet Take 2,000 Units by mouth daily.    . clindamycin (CLEOCIN T) 1 % external solution Apply 1 application topically See admin instructions. Apply to face two times a day    . dofetilide (TIKOSYN) 500 MCG capsule Take 1 capsule (500 mcg total) by mouth 2 (two) times daily. 60 capsule 2  . glipiZIDE (GLUCOTROL XL) 2.5 MG 24 hr tablet Take 2.5 mg by mouth daily with breakfast.    . loperamide (IMODIUM A-D) 2 MG tablet Take 2 mg by mouth as needed for diarrhea or loose stools.     . Melatonin 5 MG CAPS Take 5 mg by mouth at bedtime as needed (sleep).    . Multiple Vitamins-Minerals (MULTIVITAMIN WITH MINERALS) tablet Take 1 tablet by mouth daily.      . nitroGLYCERIN (NITROSTAT) 0.4 MG SL tablet Place 0.4 mg under the tongue every 5 (five) minutes as needed for chest pain.    [name] olmesartan  (BENICAR) 20 MG tablet Take 1 tablet (20 mg total) by mouth daily.    . pantoprazole (PROTONIX) 40 MG tablet TAKE 1 TABLET BY MOUTH TWICE DAILY. 60 tablet 9  . potassium chloride SA (KLOR-CON) 20 MEQ tablet Take 1 tablet (20 mEq total) by mouth daily. 60 tablet 2  . rivaroxaban (XARELTO) 20 MG TABS tablet Take 1 tablet (20 mg total) by mouth daily with supper.     No current facility-administered medications for this visit.     Past Medical History:  Diagnosis Date  . ACL tear    RIGHT KNEE  . Atrial fibrillation ([place]) 2012   Rare occurrences. On ASA & Flecainide prn  . Atrial flutter ([place])    s/p ablation 2008. On Flecainide prn.  . Broken leg 1968  . Concussion 1966   History of 4  . DVT (deep venous thrombosis) ([facility])    LEFT LOWER LEG  . Dyslipidemia   . Essential hypertension, benign   . Insomnia   . Low back pain   . Obesity (BMI 30-39.9)   . Obstructive sleep apnea on CPAP    Mild  . PAF (paroxysmal atrial fibrillation) ([place])    Rare occurrences. On ASA & Flecainide prn  . Pure  hypercholesterolemia    On Lipitor   . Right shoulder pain    Intermittent  . Status post ablation of atrial flutter 2008  . Umbilical hernia     ROS:   All systems reviewed and negative except as noted in the HPI.   Past Surgical History:  Procedure Laterality Date  . ablation for atrial flutter  01/2002  . CARDIAC CATHETERIZATION  12/26/01  . CARDIAC CATHETERIZATION  04/18/2019  . COLONOSCOPY WITH PROPOFOL N/A 03/12/2016   Procedure: COLONOSCOPY WITH PROPOFOL;  Surgeon: [name], MD;  Location: [facility];  Service: Endoscopy;  Laterality: N/A;  . COLONOSCOPY WITH PROPOFOL N/A 05/05/2019   Procedure: COLONOSCOPY WITH PROPOFOL;  Surgeon: [name], MD;  Location: [facility];  Service: Endoscopy;  Laterality: N/A;  . coronary angiography  1997   40% stenosis  . ESOPHAGOGASTRODUODENOSCOPY (EGD) WITH PROPOFOL N/A 10/15/2016   Procedure: ESOPHAGOGASTRODUODENOSCOPY (EGD) WITH  PROPOFOL;  Surgeon: [name], MD;  Location: [facility];  Service: Endoscopy;  Laterality: N/A;  . LEFT HEART CATH AND CORONARY ANGIOGRAPHY N/A 02/11/2017   Procedure: LEFT HEART CATH AND CORONARY ANGIOGRAPHY;  Surgeon: Belva Crome, MD;  Location: [facility];  Service: Cardiovascular;  Laterality: N/A;  . LEFT HEART CATH AND CORONARY ANGIOGRAPHY N/A 04/18/2019   Procedure: LEFT HEART CATH AND CORONARY ANGIOGRAPHY;  Surgeon: Belva Crome, MD;  Location: [facility];  Service: Cardiovascular;  Laterality: N/A;  . POLYPECTOMY  05/05/2019   Procedure: POLYPECTOMY;  Surgeon: [name], MD;  Location: [facility] END[facility]COPY;  Service: Endoscopy;;  . right knee surgery  1996   ACL, meniscus problems  . UMBILICAL HERNIA REPAIR  04/2015     Family History  Problem Relation Age of Onset  . Hypertension Mother 56  . Diabetes Mother   . COPD Mother   . Heart disease Mother   . Heart disease Father 21  . Hypertension Father   . Heart failure Father   . Heart attack Father   . Diabetes Brother 72  . Hypertension Brother 77  . Heart attack Paternal Grandfather   . Colon cancer Maternal Grandfather 67  . Stroke Neg Hx      Social History   Socioeconomic History  . Marital status: Married    Spouse name: Not on file  . Number of children: 2  . Years of education: Not on file  . Highest education level: Not on file  Occupational History  . Occupation: Scientist, clinical (histocompatibility and immunogenetics): [employer]  Tobacco Use  . Smoking status: Never Smoker  . Smokeless tobacco: Never Used  Substance and Sexual Activity  . Alcohol use: Yes    Alcohol/week: 7.0 - 10.0 standard drinks    Types: 7 - 10 Glasses of wine per week  . Drug use: No  . Sexual activity: Not on file  Other Topics Concern  . Not on file  Social History Narrative   Caffeine: yes, coffee 2-3 cups daily, tea-rare.    Exercise-yes, 2-3X weekly, jog/run.    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    Marital Status: Married.    Children: 2 children-grown               Social Determinants of Health   Financial Resource Strain:   . Difficulty of Paying Living Expenses: Not on file  Food Insecurity:   . Worried About Charity fundraiser in the Last Year: Not on file  .  Ran Out of Food in the Last Year: Not on file  Transportation Needs:   . Lack of Transportation (Medical): Not on file  . Lack of Transportation (Non-Medical): Not on file  Physical Activity:   . Days of Exercise per Week: Not on file  . Minutes of Exercise per Session: Not on file  Stress:   . Feeling of Stress : Not on file  Social Connections:   . Frequency of Communication with Friends and Family: Not on file  . Frequency of Social Gatherings with Friends and Family: Not on file  . Attends Religious Services: Not on file  . Active Member of Clubs or Organizations: Not on file  . Attends Archivist Meetings: Not on file  . Marital Status: Not on file  Intimate Partner Violence:   . Fear of Current or Ex-Partner: Not on file  . Emotionally Abused: Not on file  . Physically Abused: Not on file  . Sexually Abused: Not on file     BP 124/78   Pulse 65   Ht 6\' 7"  (2.007 m)   Wt 226 lb 9.6 oz (102.8 kg)   SpO2 96%   BMI 25.53 kg/m   Physical Exam:  Well appearing NAD HEENT: Unremarkable Neck:  No JVD, no thyromegally Lymphatics:  No adenopathy Back:  No CVA tenderness Lungs:  Clear with no wheezes HEART:  Regular rate rhythm, no murmurs, no rubs, no clicks Abd:  soft, positive bowel sounds, no organomegally, no rebound, no guarding Ext:  2 plus pulses, no edema, no cyanosis, no clubbing Skin:  No rashes no nodules Neuro:  CN II through XII intact, motor grossly intact  EKG - NSR with QTC of about 460.  Assess/Plan: 1. Persistent atrial fib - he is maintaining NSR mostly. He will continue dofetilide. We discussed the importance of not  missing a does as well as what to do if he takes a dose late.  2. Peripheral edema - He notes that his left leg will occaisionally swell. I have encouraged him to avoid salty food, and keep his leg elevated.  3. Diverticulosis - he has had some GI bleeding. He has been started back on xarelto. If he were to bleed again, I would consider [facility]. 4. HTN - his bp is well controlled.  He will continue his current meds.  Mikle Bosworth.D.

## 2019-05-23 NOTE — Patient Instructions (Addendum)

## 2019-05-30 ENCOUNTER — Ambulatory Visit: Payer: BC Managed Care – PPO | Attending: Internal Medicine

## 2019-05-30 DIAGNOSIS — Z23 Encounter for immunization: Secondary | ICD-10-CM

## 2019-05-30 NOTE — Progress Notes (Signed)
   Covid-19 Vaccination Clinic  Name:  Carlos Gutierrez    MRN: [id number] DOB: 08/17/1950  05/30/2019  [name] was observed post Covid-19 immunization for 15 minutes without incident. He was provided with Vaccine Information Sheet and instruction to access the V-Safe system.   [name] was instructed to call 911 with any severe reactions post vaccine: [name] Difficulty breathing  . Swelling of face and throat  . A fast heartbeat  . A bad rash all over body  . Dizziness and weakness   Immunizations Administered    Name Date Dose VIS Date Route   Pfizer COVID-19 Vaccine 05/30/2019 10:22 AM 0.3 mL 02/23/2019 Intramuscular   Manufacturer: [place]   Lot: [id number]   [place]: [id number]

## 2019-06-11 DIAGNOSIS — M25521 Pain in right elbow: Secondary | ICD-10-CM | POA: Diagnosis not present

## 2019-06-23 ENCOUNTER — Other Ambulatory Visit: Payer: Self-pay | Admitting: Physician Assistant

## 2019-06-25 NOTE — Telephone Encounter (Signed)
Prescription refill request for Xarelto received.   Last office visit: 05/23/2019, Carlos Gutierrez Weight: 102.8 kg Age: 69 y.o. Scr: 1.02, 05/05/2019 CrCl: 102 ml/min  Prescription refill sent.

## 2019-07-10 DIAGNOSIS — C44311 Basal cell carcinoma of skin of nose: Secondary | ICD-10-CM | POA: Diagnosis not present

## 2019-07-13 ENCOUNTER — Other Ambulatory Visit: Payer: Self-pay | Admitting: Internal Medicine

## 2019-08-28 ENCOUNTER — Other Ambulatory Visit: Payer: Self-pay

## 2019-08-28 ENCOUNTER — Encounter (HOSPITAL_COMMUNITY): Payer: Self-pay | Admitting: Physician Assistant

## 2019-08-28 ENCOUNTER — Ambulatory Visit (HOSPITAL_COMMUNITY)
Admission: RE | Admit: 2019-08-28 | Discharge: 2019-08-28 | Disposition: A | Discharge status: Home / Self Care | Payer: BC Managed Care – PPO | Source: Ambulatory Visit | Attending: Physician Assistant | Admitting: Physician Assistant

## 2019-08-28 VITALS — BP 134/88 | HR 55 | Ht 79.0 in | Wt 234.8 lb

## 2019-08-28 DIAGNOSIS — I480 Paroxysmal atrial fibrillation: Secondary | ICD-10-CM | POA: Diagnosis not present

## 2019-08-28 DIAGNOSIS — I2510 Atherosclerotic heart disease of native coronary artery without angina pectoris: Secondary | ICD-10-CM | POA: Insufficient documentation

## 2019-08-28 DIAGNOSIS — E119 Type 2 diabetes mellitus without complications: Secondary | ICD-10-CM | POA: Insufficient documentation

## 2019-08-28 DIAGNOSIS — G4733 Obstructive sleep apnea (adult) (pediatric): Secondary | ICD-10-CM | POA: Diagnosis not present

## 2019-08-28 DIAGNOSIS — I10 Essential (primary) hypertension: Secondary | ICD-10-CM | POA: Diagnosis not present

## 2019-08-28 DIAGNOSIS — Z888 Allergy status to other drugs, medicaments and biological substances status: Secondary | ICD-10-CM | POA: Insufficient documentation

## 2019-08-28 DIAGNOSIS — Z7901 Long term (current) use of anticoagulants: Secondary | ICD-10-CM | POA: Insufficient documentation

## 2019-08-28 DIAGNOSIS — Z7984 Long term (current) use of oral hypoglycemic drugs: Secondary | ICD-10-CM | POA: Insufficient documentation

## 2019-08-28 DIAGNOSIS — Z7982 Long term (current) use of aspirin: Secondary | ICD-10-CM | POA: Insufficient documentation

## 2019-08-28 DIAGNOSIS — E785 Hyperlipidemia, unspecified: Secondary | ICD-10-CM | POA: Diagnosis not present

## 2019-08-28 DIAGNOSIS — Z79899 Other long term (current) drug therapy: Secondary | ICD-10-CM | POA: Insufficient documentation

## 2019-08-28 DIAGNOSIS — Z86718 Personal history of other venous thrombosis and embolism: Secondary | ICD-10-CM | POA: Insufficient documentation

## 2019-08-28 DIAGNOSIS — G4700 Insomnia, unspecified: Secondary | ICD-10-CM | POA: Insufficient documentation

## 2019-08-28 DIAGNOSIS — Z8249 Family history of ischemic heart disease and other diseases of the circulatory system: Secondary | ICD-10-CM | POA: Diagnosis not present

## 2019-08-28 DIAGNOSIS — D6869 Other thrombophilia: Secondary | ICD-10-CM | POA: Diagnosis not present

## 2019-08-28 DIAGNOSIS — Z833 Family history of diabetes mellitus: Secondary | ICD-10-CM | POA: Diagnosis not present

## 2019-08-28 DIAGNOSIS — I4892 Unspecified atrial flutter: Secondary | ICD-10-CM | POA: Diagnosis not present

## 2019-08-28 LAB — BASIC METABOLIC PANEL
Anion gap: 7 (ref 5–15)
BUN: 16 mg/dL (ref 8–23)
CO2: 24 mmol/L (ref 22–32)
Calcium: 9.3 mg/dL (ref 8.9–10.3)
Chloride: 108 mmol/L (ref 98–111)
Creatinine, Ser: 1.04 mg/dL (ref 0.61–1.24)
GFR calc Af Amer: >60 mL/min (ref >60)
GFR calc non Af Amer: >60 mL/min (ref >60)
Glucose, Bld: 147 mg/dL — ABNORMAL HIGH (ref 70–99)
Potassium: 4.5 mmol/L (ref 3.5–5.1)
Sodium: 139 mmol/L (ref 135–145)

## 2019-08-28 LAB — MAGNESIUM: Magnesium: 2 mg/dL (ref 1.7–2.4)

## 2019-08-28 NOTE — Progress Notes (Signed)
Primary Care Physician: Carlos Pepper, MD Primary Cardiologist: Carlos Gutierrez Primary Electrophysiologist: Carlos Gutierrez Referring Physician: Dr Theresa Duty is a 69 y.o. male with a history of CAD (non-obstructive), HTN, HLD, DM, AFlutter (ablated 2003), AFib, DVT, OSA w/CPAP who presents for follow up in the [facility]. Patient is on Xarelto for a CHADS2VASC score of 4. Patient admitted for cardiac catheterization on 04/18/19 for progressive SOB. LHC showed diffuse coronary plaquing with a 70% narrowing in the RCA which is unchanged from 2018. Given patient's CAD, flecainide was stopped and patient was loaded on dofetilide.   On follow up today, patient was hospitalized 04/2019 with a diverticular bleed. Anticoagulation was temporarily held. He has not had any further bleeding issues. He has had infrequent episodes of afib with symptoms of fatigue. His most recent episode he attributes to stress given his wife's recent health issues.   Today, he denies symptoms of palpitations, chest pain, shortness of breath, orthopnea, PND, lower extremity edema, dizziness, presyncope, syncope, snoring, daytime somnolence, bleeding, or neurologic sequela. The patient is tolerating medications without difficulties and is otherwise without complaint today.    Atrial Fibrillation Risk Factors:  he does have symptoms or diagnosis of sleep apnea. he is compliant with CPAP therapy. he does not have a history of rheumatic fever.   he has a BMI of Body mass index is 26.45 kg/m.[name] Filed Weights   08/28/19 1002  Weight: 106.5 kg    Family History  Problem Relation Age of Onset  . Hypertension Mother 56  . Diabetes Mother   . COPD Mother   . Heart disease Mother   . Heart disease Father 29  . Hypertension Father   . Heart failure Father   . Heart attack Father   . Diabetes Brother 48  . Hypertension Brother 17  . Heart attack Paternal Grandfather   . Colon cancer  Maternal Grandfather 53  . Stroke Neg Hx      Atrial Fibrillation Management history:  Previous antiarrhythmic drugs: flecainide, dofetilide Previous cardioversions: none Previous ablations: 2003 flutter CHADS2VASC score: 4 Anticoagulation history: Xarelto   Past Medical History:  Diagnosis Date  . ACL tear    RIGHT KNEE  . Atrial fibrillation ([place]) 2012   Rare occurrences. On ASA & Flecainide prn  . Atrial flutter ([place])    s/p ablation 2008. On Flecainide prn.  . Broken leg 1968  . Concussion 1966   History of 4  . DVT (deep venous thrombosis) (HCC)    LEFT LOWER LEG  . Dyslipidemia   . Essential hypertension, benign   . Insomnia   . Low back pain   . Obesity (BMI 30-39.9)   . Obstructive sleep apnea on CPAP    Mild  . PAF (paroxysmal atrial fibrillation) ([place])    Rare occurrences. On ASA & Flecainide prn  . Pure hypercholesterolemia    On Lipitor   . Right shoulder pain    Intermittent  . Status post ablation of atrial flutter 2008  . Umbilical hernia    Past Surgical History:  Procedure Laterality Date  . ablation for atrial flutter  01/2002  . CARDIAC CATHETERIZATION  12/26/01  . CARDIAC CATHETERIZATION  04/18/2019  . COLONOSCOPY WITH PROPOFOL N/A 03/12/2016   Procedure: COLONOSCOPY WITH PROPOFOL;  Surgeon: [name], MD;  Location: [facility];  Service: Endoscopy;  Laterality: N/A;  . COLONOSCOPY WITH PROPOFOL N/A 05/05/2019   Procedure: COLONOSCOPY WITH PROPOFOL;  Surgeon: Benson Norway,  Saralyn Pilar, MD;  Location: [place];  Service: Endoscopy;  Laterality: N/A;  . coronary angiography  1997   40% stenosis  . ESOPHAGOGASTRODUODENOSCOPY (EGD) WITH PROPOFOL N/A 10/15/2016   Procedure: ESOPHAGOGASTRODUODENOSCOPY (EGD) WITH PROPOFOL;  Surgeon: [name], MD;  [facility];  Service: Endoscopy;  Laterality: N/A;  . LEFT HEART CATH AND CORONARY ANGIOGRAPHY N/A 02/11/2017   Procedure: LEFT HEART CATH AND CORONARY ANGIOGRAPHY;  Surgeon: Belva Crome,  MD;  Location: [place] [facility];  Service: Cardiovascular;  Laterality: N/A;  . LEFT HEART CATH AND CORONARY ANGIOGRAPHY N/A 04/18/2019   Procedure: LEFT HEART CATH AND CORONARY ANGIOGRAPHY;  Surgeon: Belva Crome, MD;  Location: [place] [facility];  Service: Cardiovascular;  Laterality: N/A;  . POLYPECTOMY  05/05/2019   Procedure: POLYPECTOMY;  Surgeon: [name], MD;  Location: [facility] ENDOSCOPY;  Service: Endoscopy;;  . right knee surgery  1996   ACL, meniscus problems  . UMBILICAL HERNIA REPAIR  04/2015    Current Outpatient Medications  Medication Sig Dispense Refill  . acetaminophen (TYLENOL) 500 MG tablet Take 1,000 mg by mouth as needed for mild pain, moderate pain or headache.     . Ascorbic Acid (VITAMIN C) 1000 MG tablet Take 1,000 mg by mouth daily.    [name] aspirin EC 81 MG tablet Take 1 tablet (81 mg total) by mouth daily. 30 tablet 0  . atorvastatin (LIPITOR) 80 MG tablet Take 1 tablet (80 mg total) by mouth daily at 6 PM. 60 tablet 2  . cetirizine (ZYRTEC) 10 MG tablet Take 10 mg by mouth at bedtime as needed for allergies.    . Cholecalciferol (VITAMIN D) 50 MCG (2000 UT) tablet Take 2,000 Units by mouth daily.    [name] CLINDACIN ETZ 1 % SWAB SMARTSIG:1 Swab Topical Twice Daily    . clindamycin (CLEOCIN T) 1 % external solution Apply 1 application topically See admin instructions. Apply to face two times a day    . dofetilide (TIKOSYN) 500 MCG capsule TAKE (1) CAPSULE TWICE DAILY. 60 capsule 11  . glipiZIDE (GLUCOTROL XL) 2.5 MG 24 hr tablet Take 2.5 mg by mouth daily with breakfast.    . loperamide (IMODIUM A-D) 2 MG tablet Take 2 mg by mouth as needed for diarrhea or loose stools.     . Melatonin 5 MG CAPS Take 5 mg by mouth at bedtime as needed (sleep).    . Multiple Vitamins-Minerals (MULTIVITAMIN WITH MINERALS) tablet Take 1 tablet by mouth daily.      . nitroGLYCERIN (NITROSTAT) 0.4 MG SL tablet Place 0.4 mg under the tongue every 5 (five) minutes as needed for chest pain.      [name] olmesartan (BENICAR) 20 MG tablet Take 1 tablet (20 mg total) by mouth daily.    . pantoprazole (PROTONIX) 40 MG tablet TAKE 1 TABLET BY MOUTH TWICE DAILY. 60 tablet 9  . potassium chloride SA (KLOR-CON) 20 MEQ tablet Take 1 tablet (20 mEq total) by mouth daily. 60 tablet 2  . XARELTO 20 MG TABS tablet TAKE 1 TABLET ONCE DAILY WITH DINNER. 30 tablet 5   No current facility-administered medications for this encounter.    Allergies  Allergen Reactions  . Metformin And Related Nausea And Vomiting and Other (See Comments)    Dizziness and disorientation, too    Social History   Socioeconomic History  . Marital status: Married    Spouse name: Not on file  . Number of children: 2  . Years of education: Not  on file  . Highest education level: Not on file  Occupational History  . Occupation: Scientist, clinical (histocompatibility and immunogenetics): OTHER  Tobacco Use  . Smoking status: Never Smoker  . Smokeless tobacco: Never Used  Vaping Use  . Vaping Use: Never used  Substance and Sexual Activity  . Alcohol use: Yes    Alcohol/week: 9.0 - 12.0 standard drinks    Types: 7 - 10 Glasses of wine, 2 Standard drinks or equivalent per week  . Drug use: No  . Sexual activity: Not on file  Other Topics Concern  . Not on file  Social History Narrative   Caffeine: yes, coffee 2-3 cups daily, tea-rare.    Exercise-yes, 2-3X weekly, jog/run.    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    Marital Status: Married.    Children: 2 children-grown               Social Determinants of Health   Financial Resource Strain:   . Difficulty of Paying Living Expenses:   Food Insecurity:   . Worried About Charity fundraiser in the Last Year:   . Arboriculturist in the Last Year:   Transportation Needs:   . Film/video editor (Medical):   [name] Lack of Transportation (Non-Medical):   Physical Activity:   . Days of Exercise per Week:   . Minutes of  Exercise per Session:   Stress:   . Feeling of Stress :   Social Connections:   . Frequency of Communication with Friends and Family:   . Frequency of Social Gatherings with Friends and Family:   . Attends Religious Services:   . Active Member of Clubs or Organizations:   . Attends Archivist Meetings:   [name] Marital Status:   Intimate Partner Violence:   . Fear of Current or Ex-Partner:   . Emotionally Abused:   [name] Physically Abused:   . Sexually Abused:      ROS- All systems are reviewed and negative except as per the HPI above.  Physical Exam: Vitals:   08/28/19 1002  BP: 134/88  Pulse: (!) 55  Weight: 106.5 kg  Height: 6\' 7"  (2.007 m)    GEN- The patient is well appearing, alert and oriented x 3 today.   HEENT-head normocephalic, atraumatic, sclera clear, conjunctiva pink, hearing intact, trachea midline. Lungs- Clear to ausculation bilaterally, normal work of breathing Heart- Regular rate and rhythm, no murmurs, rubs or gallops  GI- soft, NT, ND, + BS Extremities- no clubbing, cyanosis, or edema MS- no significant deformity or atrophy Skin- no rash or lesion Psych- euthymic mood, full affect Neuro- strength and sensation are intact   Wt Readings from Last 3 Encounters:  08/28/19 106.5 kg  05/23/19 102.8 kg  05/05/19 105.2 kg    EKG today demonstrates SB HR 55, 1st degree AV block, PR 238, QRS 100, QTc 443  Echo 04/18/19 demonstrated  1. Left ventricular ejection fraction, by visual estimation, is 50 to  55%. The left ventricle has normal function. There is no left ventricular  hypertrophy.  2. Definity contrast agent was given IV to delineate the left ventricular  endocardial borders.  3. The left ventricle has no regional wall motion abnormalities.  4. Left ventricular diastolic parameters are indeterminate.  5. Global right ventricle has normal systolic function.The right  ventricular size is not well visualized. Right vetricular wall thickness   was not assessed.  6. Left  atrial size was normal.  7. Right atrial size was normal.  8. The mitral valve is normal in structure. No evidence of mitral valve  regurgitation. No evidence of mitral stenosis.  9. The tricuspid valve is normal in structure. Tricuspid valve  regurgitation is trivial.  10. The aortic valve is grossly normal. Aortic valve regurgitation is not  visualized. No evidence of aortic valve sclerosis or stenosis.  11. The pulmonic valve was normal in structure. Pulmonic valve  regurgitation is trivial.  12. The inferior vena cava is normal in size with greater than 50%  respiratory variability, suggesting right atrial pressure of 3 mmHg.  13. TR signal is inadequate for assessing pulmonary artery systolic  pressure.   Epic records are reviewed at length today  CHA2DS2-VASc Score = 4  The patient's score is based upon: CHF History: 0 HTN History: 1 Age : 1 Diabetes History: 1 Stroke History: 0 Vascular Disease History: 1 Gender: 0      ASSESSMENT AND PLAN: 1. Paroxysmal Atrial Fibrillation (ICD10:  I48.0) The patient's CHA2DS2-VASc score is 4, indicating a 4.8% annual risk of stroke.   S/p dofetilide loading 2/4-04/22/19 Patient having infrequent afib episodes, overall doing well. Continue dofetilide 500 mcg BID. QT stable. Check bmet/mag today Continue Xarelto 20 mg daily. ? Watchman candidate with h/o diverticular bleeding.  2. Secondary Hypercoagulable State (ICD10:  D68.69) The patient is at significant risk for stroke/thromboembolism based upon his CHA2DS2-VASc Score of 4.  Continue Rivaroxaban (Xarelto).   3. Obstructive sleep apnea Patient compliant with CPAP therapy.  4. CAD No anginal symptoms.  5. HTN Stable, no changes today.   Follow up with Carlos Gutierrez as scheduled. AF clinic in 4 months.    [facility] [address] [place], [place] [address] [phone] 08/28/2019 11:19 AM

## 2019-09-07 ENCOUNTER — Other Ambulatory Visit: Payer: Self-pay | Admitting: Cardiology

## 2019-11-01 DIAGNOSIS — M79671 Pain in right foot: Secondary | ICD-10-CM | POA: Diagnosis not present

## 2019-11-09 ENCOUNTER — Other Ambulatory Visit: Payer: Self-pay | Admitting: Interventional Cardiology

## 2019-11-09 ENCOUNTER — Other Ambulatory Visit: Payer: Self-pay | Admitting: Physician Assistant

## 2019-11-09 NOTE — Telephone Encounter (Signed)
Prescription refill request for Xarelto received.   Last office visit: 08/28/2019 Weight: 102.8 kg Age: 69 y.o. Scr: 1.04, 08/28/2019 CrCl: 100 ml/min   Prescription refill sent.

## 2019-11-12 DIAGNOSIS — D1801 Hemangioma of skin and subcutaneous tissue: Secondary | ICD-10-CM | POA: Diagnosis not present

## 2019-11-12 DIAGNOSIS — D225 Melanocytic nevi of trunk: Secondary | ICD-10-CM | POA: Diagnosis not present

## 2019-11-12 DIAGNOSIS — L570 Actinic keratosis: Secondary | ICD-10-CM | POA: Diagnosis not present

## 2019-11-12 DIAGNOSIS — E1169 Type 2 diabetes mellitus with other specified complication: Secondary | ICD-10-CM | POA: Diagnosis not present

## 2019-11-12 DIAGNOSIS — Z23 Encounter for immunization: Secondary | ICD-10-CM | POA: Diagnosis not present

## 2019-11-12 DIAGNOSIS — I10 Essential (primary) hypertension: Secondary | ICD-10-CM | POA: Diagnosis not present

## 2019-11-12 DIAGNOSIS — Z86718 Personal history of other venous thrombosis and embolism: Secondary | ICD-10-CM | POA: Diagnosis not present

## 2019-11-12 DIAGNOSIS — N400 Enlarged prostate without lower urinary tract symptoms: Secondary | ICD-10-CM | POA: Diagnosis not present

## 2019-11-12 DIAGNOSIS — L738 Other specified follicular disorders: Secondary | ICD-10-CM | POA: Diagnosis not present

## 2019-11-12 DIAGNOSIS — I4891 Unspecified atrial fibrillation: Secondary | ICD-10-CM | POA: Diagnosis not present

## 2019-11-12 DIAGNOSIS — L814 Other melanin hyperpigmentation: Secondary | ICD-10-CM | POA: Diagnosis not present

## 2019-11-12 DIAGNOSIS — E785 Hyperlipidemia, unspecified: Secondary | ICD-10-CM | POA: Diagnosis not present

## 2019-11-13 ENCOUNTER — Encounter: Payer: Self-pay | Admitting: Podiatry

## 2019-11-13 ENCOUNTER — Ambulatory Visit (INDEPENDENT_AMBULATORY_CARE_PROVIDER_SITE_OTHER): Payer: BC Managed Care – PPO

## 2019-11-13 ENCOUNTER — Ambulatory Visit: Payer: BC Managed Care – PPO | Admitting: Podiatry

## 2019-11-13 ENCOUNTER — Other Ambulatory Visit: Payer: Self-pay

## 2019-11-13 DIAGNOSIS — M21619 Bunion of unspecified foot: Secondary | ICD-10-CM | POA: Diagnosis not present

## 2019-11-13 DIAGNOSIS — M779 Enthesopathy, unspecified: Secondary | ICD-10-CM

## 2019-11-13 DIAGNOSIS — M21961 Unspecified acquired deformity of right lower leg: Secondary | ICD-10-CM

## 2019-11-13 DIAGNOSIS — M7751 Other enthesopathy of right foot: Secondary | ICD-10-CM

## 2019-11-14 NOTE — Progress Notes (Signed)
Subjective:   Patient ID: Carlos Gutierrez, male   DOB: 69 y.o.   MRN: [id number]   HPI 69 year old male presents the office today for concerns of pain to his right foot.  He points along the forefoot area on the second MPJ and second interspace respiratory discomfort.  This is been all over last 1.5 months.  He recently seen in urgent care and was told to wear a gel pad which he states made the pain worse.  He states that he wears a stiff call shoe over his shoe it feels better.  Denies any specific injury.  Difficult to say if there is any swelling.  He is diabetic and last A1c was 6.1.  He has no other concerns today.   Review of Systems  All other systems reviewed and are negative.  Past Medical History:  Diagnosis Date  . ACL tear    RIGHT KNEE  . Atrial fibrillation ([place]) 2012   Rare occurrences. On ASA & Flecainide prn  . Atrial flutter ([place])    s/p ablation 2008. On Flecainide prn.  . Broken leg 1968  . Concussion 1966   History of 4  . DVT (deep venous thrombosis) (HCC)    LEFT LOWER LEG  . Dyslipidemia   . Essential hypertension, benign   . Insomnia   . Low back pain   . Obesity (BMI 30-39.9)   . Obstructive sleep apnea on CPAP    Mild  . PAF (paroxysmal atrial fibrillation) ([place])    Rare occurrences. On ASA & Flecainide prn  . Pure hypercholesterolemia    On Lipitor   . Right shoulder pain    Intermittent  . Status post ablation of atrial flutter 2008  . Umbilical hernia     Past Surgical History:  Procedure Laterality Date  . ablation for atrial flutter  01/2002  . CARDIAC CATHETERIZATION  12/26/01  . CARDIAC CATHETERIZATION  04/18/2019  . COLONOSCOPY WITH PROPOFOL N/A 03/12/2016   Procedure: COLONOSCOPY WITH PROPOFOL;  Surgeon: [name], MD;  [facility]: [facility];  Service: Endoscopy;  Laterality: N/A;  . COLONOSCOPY WITH PROPOFOL N/A 05/05/2019   Procedure: COLONOSCOPY WITH PROPOFOL;  Surgeon: [name], MD;  Location: [place];  Service:  Endoscopy;  Laterality: N/A;  . coronary angiography  1997   40% stenosis  . ESOPHAGOGASTRODUODENOSCOPY (EGD) WITH PROPOFOL N/A 10/15/2016   Procedure: ESOPHAGOGASTRODUODENOSCOPY (EGD) WITH PROPOFOL;  Surgeon: [name], MD;  [facility];  Service: Endoscopy;  Laterality: N/A;  . LEFT HEART CATH AND CORONARY ANGIOGRAPHY N/A 02/11/2017   Procedure: LEFT HEART CATH AND CORONARY ANGIOGRAPHY;  Surgeon: Belva Crome, MD;  Location: [place] [facility];  Service: Cardiovascular;  Laterality: N/A;  . LEFT HEART CATH AND CORONARY ANGIOGRAPHY N/A 04/18/2019   Procedure: LEFT HEART CATH AND CORONARY ANGIOGRAPHY;  Surgeon: Belva Crome, MD;  Location: [place] [facility];  Service: Cardiovascular;  Laterality: N/A;  . POLYPECTOMY  05/05/2019   Procedure: POLYPECTOMY;  Surgeon: [name], MD;  Location: [facility] ENDOSCOPY;  Service: Endoscopy;;  . right knee surgery  1996   ACL, meniscus problems  . UMBILICAL HERNIA REPAIR  04/2015     Current Outpatient Medications:  .  acetaminophen (TYLENOL) 500 MG tablet, Take 1,000 mg by mouth as needed for mild pain, moderate pain or headache. , Disp: , Rfl:  .  Ascorbic Acid (VITAMIN C) 1000 MG tablet, Take 1,000 mg by mouth daily., Disp: , Rfl:  .  aspirin EC 81 MG  tablet, Take 1 tablet (81 mg total) by mouth daily., Disp: 30 tablet, Rfl: 0 .  atorvastatin (LIPITOR) 80 MG tablet, Take 1 tablet (80 mg total) by mouth daily at 6 PM., Disp: 90 tablet, Rfl: 2 .  cetirizine (ZYRTEC) 10 MG tablet, Take 10 mg by mouth at bedtime as needed for allergies., Disp: , Rfl:  .  Cholecalciferol (VITAMIN D) 50 MCG (2000 UT) tablet, Take 2,000 Units by mouth daily., Disp: , Rfl:  .  CLINDACIN ETZ 1 % SWAB, SMARTSIG:1 Swab Topical Twice Daily, Disp: , Rfl:  .  clindamycin (CLEOCIN T) 1 % external solution, Apply 1 application topically See admin instructions. Apply to face two times a day, Disp: , Rfl:  .  dofetilide (TIKOSYN) 500 MCG capsule, TAKE (1) CAPSULE TWICE  DAILY., Disp: 60 capsule, Rfl: 11 .  glipiZIDE (GLUCOTROL XL) 2.5 MG 24 hr tablet, Take 2.5 mg by mouth daily with breakfast., Disp: , Rfl:  .  loperamide (IMODIUM A-D) 2 MG tablet, Take 2 mg by mouth as needed for diarrhea or loose stools. , Disp: , Rfl:  .  Melatonin 5 MG CAPS, Take 5 mg by mouth at bedtime as needed (sleep)., Disp: , Rfl:  .  Multiple Vitamins-Minerals (MULTIVITAMIN WITH MINERALS) tablet, Take 1 tablet by mouth daily.  , Disp: , Rfl:  .  nitroGLYCERIN (NITROSTAT) 0.4 MG SL tablet, Place 0.4 mg under the tongue every 5 (five) minutes as needed for chest pain., Disp: , Rfl:  .  olmesartan (BENICAR) 20 MG tablet, Take 1 tablet (20 mg total) by mouth daily., Disp:  , Rfl:  .  pantoprazole (PROTONIX) 40 MG tablet, TAKE 1 TABLET BY MOUTH TWICE DAILY., Disp: 180 tablet, Rfl: 3 .  potassium chloride SA (KLOR-CON) 20 MEQ tablet, Take 1 tablet (20 mEq total) by mouth daily., Disp: 90 tablet, Rfl: 2 .  traMADol (ULTRAM) 50 MG tablet, Take 50 mg by mouth 3 (three) times daily as needed., Disp: , Rfl:  .  XARELTO 20 MG TABS tablet, TAKE 1 TABLET ONCE DAILY WITH DINNER., Disp: 60 tablet, Rfl: 5  Allergies  Allergen Reactions  . Metformin And Related Nausea And Vomiting and Other (See Comments)    Dizziness and disorientation, too         Objective:  Physical Exam  General: AAO x3, NAD  Dermatological: Skin is warm, dry and supple bilateral. Nails x 10 are well manicured; remaining integument appears unremarkable at this time. There are no open sores, no preulcerative lesions, no rash or signs of infection present.  Vascular: Dorsalis Pedis artery and Posterior Tibial artery pedal pulses are 2/4 bilateral with immedate capillary fill time. There is no pain with calf compression, swelling, warmth, erythema.   Neruologic: Grossly intact via light touch bilateral. Sensation intact with SWMF.   Musculoskeletal: Significant bunion is present with decreased range of motion of first MPJ.   There is tenderness submetatarsal 2 as well as the second interspace.  No palpable neuroma identified today.  No area of pinpoint tenderness.  Muscular strength 5/5 in all groups tested bilateral.  Gait: Unassisted, Nonantalgic.       Assessment:   69 year old male right foot capsulitis, bunion    Plan:  -Treatment options discussed including all alternatives, risks, and complications -Etiology of symptoms were discussed -X-rays were obtained and reviewed with the patient.  Significant bunion is present with arthritic changes.  Elongated second metatarsal.  No evidence of acute fracture. -Steroid injection performed.  Skin was cleaned  with alcohol and Betadine and mixture of 1 cc Kenalog 10, 0.5 cc of Marcaine plain, 0.5 cc of lidocaine plain was infiltrated on site interspace/MPJ without any complications.  Postinjection care discussed.  Tolerated well. -Discussion modifications and orthotics.  He will follow-up with Liliane Channel for orthotics.  We will need to offload second MPJ -Metatarsal pad dispensed.     No follow-ups on file.  Trula Slade DPM

## 2019-11-26 ENCOUNTER — Other Ambulatory Visit: Payer: Medicare Other | Admitting: Orthotics

## 2019-11-26 DIAGNOSIS — K7460 Unspecified cirrhosis of liver: Secondary | ICD-10-CM | POA: Diagnosis not present

## 2019-11-26 DIAGNOSIS — R7401 Elevation of levels of liver transaminase levels: Secondary | ICD-10-CM | POA: Diagnosis not present

## 2019-11-28 ENCOUNTER — Other Ambulatory Visit: Payer: Self-pay | Admitting: Gastroenterology

## 2019-11-29 ENCOUNTER — Encounter: Payer: Self-pay | Admitting: Internal Medicine

## 2019-11-29 ENCOUNTER — Ambulatory Visit: Payer: BC Managed Care – PPO | Admitting: Internal Medicine

## 2019-11-29 ENCOUNTER — Other Ambulatory Visit: Payer: Self-pay

## 2019-11-29 VITALS — BP 144/82 | HR 54 | Ht 73.0 in | Wt 233.0 lb

## 2019-11-29 DIAGNOSIS — I4819 Other persistent atrial fibrillation: Secondary | ICD-10-CM

## 2019-11-29 DIAGNOSIS — I10 Essential (primary) hypertension: Secondary | ICD-10-CM

## 2019-11-29 DIAGNOSIS — K5791 Diverticulosis of intestine, part unspecified, without perforation or abscess with bleeding: Secondary | ICD-10-CM

## 2019-11-29 NOTE — Patient Instructions (Addendum)
Medication Instructions:  Your physician recommends that you continue on your current medications as directed. Please refer to the Current Medication list given to you today.  Labwork: None ordered.  Testing/Procedures: None ordered.  Follow-Up: Your physician wants you to follow-up in: 6 months with Dr. Lovena Le.     May 27, 2019 at 11:30 am at the [facility] office   Any Other Special Instructions Will Be Listed Below (If Applicable).  If you need a refill on your cardiac medications before your next appointment, please call your pharmacy.

## 2019-11-29 NOTE — Progress Notes (Signed)
HPI Mr. Leask returns today for followup of atrial fib on dofetilide. He is a pleasant 69 yo man who was started on dofetilide about 6 months ago. He has had 2 episodes of atrial fib a month, each lasting 1-2 days. No other complaints. He uses CPAP for sleep apnea. He denies caffeine or ETOH excess. He has not had chest pain or sob. He does not think that his HR is particularly fast in atrial fib. Allergies  Allergen Reactions  . Metformin And Related Nausea And Vomiting and Other (See Comments)    Dizziness and disorientation, too     Current Outpatient Medications  Medication Sig Dispense Refill  . acetaminophen (TYLENOL) 500 MG tablet Take 1,000 mg by mouth as needed for mild pain, moderate pain or headache.     . Ascorbic Acid (VITAMIN C) 1000 MG tablet Take 1,000 mg by mouth daily.    [name] aspirin EC 81 MG tablet Take 1 tablet (81 mg total) by mouth daily. 30 tablet 0  . atorvastatin (LIPITOR) 80 MG tablet Take 1 tablet (80 mg total) by mouth daily at 6 PM. 90 tablet 2  . cetirizine (ZYRTEC) 10 MG tablet Take 10 mg by mouth at bedtime as needed for allergies.    . Cholecalciferol (VITAMIN D) 50 MCG (2000 UT) tablet Take 2,000 Units by mouth daily.    . clindamycin (CLEOCIN T) 1 % external solution Apply 1 application topically See admin instructions. Apply to face two times a day    . dofetilide (TIKOSYN) 500 MCG capsule TAKE (1) CAPSULE TWICE DAILY. 60 capsule 11  . glipiZIDE (GLUCOTROL XL) 2.5 MG 24 hr tablet Take 2.5 mg by mouth daily with breakfast.    . loperamide (IMODIUM A-D) 2 MG tablet Take 2 mg by mouth as needed for diarrhea or loose stools.     . Melatonin 5 MG CAPS Take 5 mg by mouth at bedtime as needed (sleep).    . Multiple Vitamins-Minerals (MULTIVITAMIN WITH MINERALS) tablet Take 1 tablet by mouth daily.      . nitroGLYCERIN (NITROSTAT) 0.4 MG SL tablet Place 0.4 mg under the tongue every 5 (five) minutes as needed for chest pain.    [name] olmesartan (BENICAR) 20 MG  tablet Take 1 tablet (20 mg total) by mouth daily.    . pantoprazole (PROTONIX) 40 MG tablet TAKE 1 TABLET BY MOUTH TWICE DAILY. 180 tablet 3  . potassium chloride SA (KLOR-CON) 20 MEQ tablet Take 1 tablet (20 mEq total) by mouth daily. 90 tablet 2  . traMADol (ULTRAM) 50 MG tablet Take 50 mg by mouth 3 (three) times daily as needed.    Alveda Reasons 20 MG TABS tablet TAKE 1 TABLET ONCE DAILY WITH DINNER. 60 tablet 5   No current facility-administered medications for this visit.     Past Medical History:  Diagnosis Date  . ACL tear    RIGHT KNEE  . Atrial fibrillation ([place]) 2012   Rare occurrences. On ASA & Flecainide prn  . Atrial flutter ([place])    s/p ablation 2008. On Flecainide prn.  . Broken leg 1968  . Concussion 1966   History of 4  . DVT (deep venous thrombosis) ([facility])    LEFT LOWER LEG  . Dyslipidemia   . Essential hypertension, benign   . Insomnia   . Low back pain   . Obesity (BMI 30-39.9)   . Obstructive sleep apnea on CPAP    Mild  . PAF (paroxysmal  atrial fibrillation) ([facility])    Rare occurrences. On ASA & Flecainide prn  . Pure hypercholesterolemia    On Lipitor   . Right shoulder pain    Intermittent  . Status post ablation of atrial flutter 2008  . Umbilical hernia     ROS:   All systems reviewed and negative except as noted in the HPI.   Past Surgical History:  Procedure Laterality Date  . ablation for atrial flutter  01/2002  . CARDIAC CATHETERIZATION  12/26/01  . CARDIAC CATHETERIZATION  04/18/2019  . COLONOSCOPY WITH PROPOFOL N/A 03/12/2016   Procedure: COLONOSCOPY WITH PROPOFOL;  Surgeon: [name], MD;  [facility];  Service: Endoscopy;  Laterality: N/A;  . COLONOSCOPY WITH PROPOFOL N/A 05/05/2019   Procedure: COLONOSCOPY WITH PROPOFOL;  Surgeon: [name], MD;  Location: [facility];  Service: Endoscopy;  Laterality: N/A;  . coronary angiography  1997   40% stenosis  . ESOPHAGOGASTRODUODENOSCOPY (EGD) WITH PROPOFOL N/A 10/15/2016    Procedure: ESOPHAGOGASTRODUODENOSCOPY (EGD) WITH PROPOFOL;  Surgeon: [name], MD;  [facility];  Service: Endoscopy;  Laterality: N/A;  . LEFT HEART CATH AND CORONARY ANGIOGRAPHY N/A 02/11/2017   Procedure: LEFT HEART CATH AND CORONARY ANGIOGRAPHY;  Surgeon: Belva Crome, MD;  Location: [facility];  Service: Cardiovascular;  Laterality: N/A;  . LEFT HEART CATH AND CORONARY ANGIOGRAPHY N/A 04/18/2019   Procedure: LEFT HEART CATH AND CORONARY ANGIOGRAPHY;  Surgeon: Belva Crome, MD;  Location: [facility];  Service: Cardiovascular;  Laterality: N/A;  . POLYPECTOMY  05/05/2019   Procedure: POLYPECTOMY;  Surgeon: [name], MD;  Location: [facility];  Service: Endoscopy;;  . right knee surgery  1996   ACL, meniscus problems  . UMBILICAL HERNIA REPAIR  04/2015     Family History  Problem Relation Age of Onset  . Hypertension Mother 72  . Diabetes Mother   . COPD Mother   . Heart disease Mother   . Heart disease Father 52  . Hypertension Father   . Heart failure Father   . Heart attack Father   . Diabetes Brother 66  . Hypertension Brother 36  . Heart attack Paternal Grandfather   . Colon cancer Maternal Grandfather 40  . Stroke Neg Hx      Social History   Socioeconomic History  . Marital status: Married    Spouse name: Not on file  . Number of children: 2  . Years of education: Not on file  . Highest education level: Not on file  Occupational History  . Occupation: Scientist, clinical (histocompatibility and immunogenetics): OTHER  Tobacco Use  . Smoking status: Never Smoker  . Smokeless tobacco: Never Used  Vaping Use  . Vaping Use: Never used  Substance and Sexual Activity  . Alcohol use: Yes    Alcohol/week: 9.0 - 12.0 standard drinks    Types: 7 - 10 Glasses of wine, 2 Standard drinks or equivalent per week  . Drug use: No  . Sexual activity: Not on file  Other Topics Concern  . Not on file  Social History Narrative   Caffeine: yes, coffee 2-3 cups  daily, tea-rare.    Exercise-yes, 2-3X weekly, jog/run.    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    Marital Status: Married.    Children: 2 children-grown               Social Determinants of Health   Financial Resource Strain:   .  Difficulty of Paying Living Expenses: Not on file  Food Insecurity:   . Worried About Charity fundraiser in the Last Year: Not on file  . Ran Out of Food in the Last Year: Not on file  Transportation Needs:   . Lack of Transportation (Medical): Not on file  . Lack of Transportation (Non-Medical): Not on file  Physical Activity:   . Days of Exercise per Week: Not on file  . Minutes of Exercise per Session: Not on file  Stress:   . Feeling of Stress : Not on file  Social Connections:   . Frequency of Communication with Friends and Family: Not on file  . Frequency of Social Gatherings with Friends and Family: Not on file  . Attends Religious Services: Not on file  . Active Member of Clubs or Organizations: Not on file  . Attends Archivist Meetings: Not on file  . Marital Status: Not on file  Intimate Partner Violence:   . Fear of Current or Ex-Partner: Not on file  . Emotionally Abused: Not on file  . Physically Abused: Not on file  . Sexually Abused: Not on file     BP (!) 144/82   Pulse (!) 54   Ht 6\' 1"  (1.854 m)   Wt 233 lb (105.7 kg)   SpO2 95%   BMI 30.74 kg/m   Physical Exam:  Well appearing 69 yo man, NAD HEENT: Unremarkable Neck:  No JVD, no thyromegally Lymphatics:  No adenopathy Back:  No CVA tenderness Lungs:  Clear with no wheezes HEART:  Regular rate rhythm, no murmurs, no rubs, no clicks Abd:  soft, positive bowel sounds, no organomegally, no rebound, no guarding Ext:  2 plus pulses, no edema, no cyanosis, no clubbing Skin:  No rashes no nodules Neuro:  CN II through XII intact, motor grossly intact  EKG - NSR     Assess/Plan: 1. PAF  - he has had an uptick in his atrial fib but is not particularly bothered. I discussed the treatment options with the patient. He will continue his dofetilide for now. We discussed catheter ablation. If his atrial fib worsens much despite dofetilide then referral would be a consideration. 2. CAD - he denies anginal symptoms. We will continue his current meds. 3. HTN - his bp is minimally elevated. He is encouraged to lose weight. No additional meds at this time. 4. H/o GI bleeding -he remains on xarelto and has had no additional bleeding.  Carleene Overlie Kaylena Pacifico,MD

## 2019-11-30 ENCOUNTER — Ambulatory Visit (INDEPENDENT_AMBULATORY_CARE_PROVIDER_SITE_OTHER): Payer: BC Managed Care – PPO | Admitting: Orthotics

## 2019-11-30 DIAGNOSIS — M778 Other enthesopathies, not elsewhere classified: Secondary | ICD-10-CM

## 2019-11-30 DIAGNOSIS — M21619 Bunion of unspecified foot: Secondary | ICD-10-CM

## 2019-11-30 DIAGNOSIS — M779 Enthesopathy, unspecified: Secondary | ICD-10-CM

## 2019-11-30 DIAGNOSIS — M21961 Unspecified acquired deformity of right lower leg: Secondary | ICD-10-CM

## 2019-11-30 NOTE — Progress Notes (Signed)
Patient came into today for casting bilateral f/o to address capsulitis 2 R.  Patient reports history of foot pain involving sub met 2 right.  Goal is to provide longitudinal arch support and correct any RF instability due to heel eversion/inversion.  Ultimate goal is to offload 2nd met right with poron .  Plan sulcus dress with small met pad along with offload.

## 2019-12-04 ENCOUNTER — Other Ambulatory Visit: Payer: Self-pay

## 2019-12-04 NOTE — Progress Notes (Signed)
Attempted to obtain medical history via telephone, unable to reach at this time. I left a voicemail to return pre surgical testing department's phone call.  

## 2019-12-04 NOTE — Progress Notes (Signed)
DUE TO COVID-19 ONLY ONE VISITOR IS ALLOWED TO COME WITH YOU AND STAY IN THE WAITING ROOM ONLY DURING PRE OP AND PROCEDURE.    COVID SWAB TESTING MUST BE COMPLETED ON:  Tuesday, Sept. 28, 2021 at 8:10 AM   [address] [place], [place] [address]  (Must self quarantine after testing. Follow instructions on handout.)       Your procedure is scheduled on: Friday, Oct., 1, 2021   Report to [facility] Main  Entrance have picture ID and medical insurance card available.   Call this number if you have problems the morning of surgery [phone].   Do not eat food or drink :per [place] office instructions   Oral Hygiene is also important to reduce your risk of infection.                                    Remember - BRUSH YOUR TEETH THE MORNING OF SURGERY WITH YOUR REGULAR TOOTHPASTE   Do NOT smoke after Midnight   Take these medicines the morning of surgery with A SIP OF WATER: Dofetilide, Pantoprazole  DO NOT TAKE ANY ORAL DIABETIC MEDICATIONS DAY OF YOUR SURGERY                               You may not have any metal on your body including jewelry, and body piercings             Do not wear lotions, powders, perfumes/cologne, or deodorant                          Men may shave face and neck.   Do not bring valuables to the hospital. [place].   Contacts, dentures or bridgework may not be worn into surgery.    Patients discharged the day of surgery will not be allowed to drive home.   Special Instructions: Bring a copy of your healthcare power of attorney and living will documents         the day of surgery if you haven't scanned them in before.              Please read over the following fact sheets you were given: IF YOU HAVE [place] [phone].

## 2019-12-11 ENCOUNTER — Other Ambulatory Visit (HOSPITAL_COMMUNITY)
Admission: RE | Admit: 2019-12-11 | Discharge: 2019-12-11 | Disposition: A | Discharge status: Home / Self Care | Payer: BC Managed Care – PPO | Source: Ambulatory Visit | Attending: Gastroenterology | Admitting: Gastroenterology

## 2019-12-11 DIAGNOSIS — Z01812 Encounter for preprocedural laboratory examination: Secondary | ICD-10-CM | POA: Insufficient documentation

## 2019-12-11 DIAGNOSIS — Z20822 Contact with and (suspected) exposure to covid-19: Secondary | ICD-10-CM | POA: Insufficient documentation

## 2019-12-11 LAB — SARS CORONAVIRUS 2 (TAT 6-24 HRS): SARS Coronavirus 2: NEGATIVE

## 2019-12-14 ENCOUNTER — Ambulatory Visit (HOSPITAL_COMMUNITY): Payer: BC Managed Care – PPO | Admitting: Certified Registered Nurse Anesthetist

## 2019-12-14 ENCOUNTER — Ambulatory Visit (HOSPITAL_COMMUNITY)
Admission: RE | Admit: 2019-12-14 | Discharge: 2019-12-14 | Disposition: A | Discharge status: Home / Self Care | Payer: BC Managed Care – PPO | Attending: Gastroenterology | Admitting: Gastroenterology

## 2019-12-14 ENCOUNTER — Encounter (HOSPITAL_COMMUNITY): Payer: Self-pay | Admitting: Gastroenterology

## 2019-12-14 ENCOUNTER — Other Ambulatory Visit: Payer: Self-pay

## 2019-12-14 ENCOUNTER — Encounter (HOSPITAL_COMMUNITY)
Admission: RE | Disposition: A | Discharge status: Home / Self Care | Payer: Self-pay | Source: Home / Self Care | Attending: Gastroenterology

## 2019-12-14 DIAGNOSIS — Z888 Allergy status to other drugs, medicaments and biological substances status: Secondary | ICD-10-CM | POA: Insufficient documentation

## 2019-12-14 DIAGNOSIS — Z6830 Body mass index (BMI) 30.0-30.9, adult: Secondary | ICD-10-CM | POA: Diagnosis not present

## 2019-12-14 DIAGNOSIS — E785 Hyperlipidemia, unspecified: Secondary | ICD-10-CM | POA: Diagnosis not present

## 2019-12-14 DIAGNOSIS — E119 Type 2 diabetes mellitus without complications: Secondary | ICD-10-CM | POA: Insufficient documentation

## 2019-12-14 DIAGNOSIS — E7800 Pure hypercholesterolemia, unspecified: Secondary | ICD-10-CM | POA: Insufficient documentation

## 2019-12-14 DIAGNOSIS — G4733 Obstructive sleep apnea (adult) (pediatric): Secondary | ICD-10-CM | POA: Insufficient documentation

## 2019-12-14 DIAGNOSIS — K766 Portal hypertension: Secondary | ICD-10-CM | POA: Diagnosis not present

## 2019-12-14 DIAGNOSIS — Z86718 Personal history of other venous thrombosis and embolism: Secondary | ICD-10-CM | POA: Insufficient documentation

## 2019-12-14 DIAGNOSIS — E669 Obesity, unspecified: Secondary | ICD-10-CM | POA: Insufficient documentation

## 2019-12-14 DIAGNOSIS — K7581 Nonalcoholic steatohepatitis (NASH): Secondary | ICD-10-CM | POA: Insufficient documentation

## 2019-12-14 DIAGNOSIS — K219 Gastro-esophageal reflux disease without esophagitis: Secondary | ICD-10-CM | POA: Diagnosis not present

## 2019-12-14 DIAGNOSIS — K3189 Other diseases of stomach and duodenum: Secondary | ICD-10-CM | POA: Diagnosis not present

## 2019-12-14 DIAGNOSIS — R12 Heartburn: Secondary | ICD-10-CM | POA: Diagnosis not present

## 2019-12-14 DIAGNOSIS — I10 Essential (primary) hypertension: Secondary | ICD-10-CM | POA: Insufficient documentation

## 2019-12-14 DIAGNOSIS — I480 Paroxysmal atrial fibrillation: Secondary | ICD-10-CM | POA: Diagnosis not present

## 2019-12-14 DIAGNOSIS — I2510 Atherosclerotic heart disease of native coronary artery without angina pectoris: Secondary | ICD-10-CM | POA: Diagnosis not present

## 2019-12-14 HISTORY — PX: ESOPHAGOGASTRODUODENOSCOPY (EGD) WITH PROPOFOL: SHX5813

## 2019-12-14 HISTORY — DX: Type 2 diabetes mellitus without complications: E11.9

## 2019-12-14 LAB — GLUCOSE, CAPILLARY: Glucose-Capillary: 118 mg/dL — ABNORMAL HIGH (ref 70–99)

## 2019-12-14 SURGERY — ESOPHAGOGASTRODUODENOSCOPY (EGD) WITH PROPOFOL
Anesthesia: Monitor Anesthesia Care

## 2019-12-14 MED ORDER — SODIUM CHLORIDE 0.9 % IV SOLN: INTRAVENOUS | Status: DC

## 2019-12-14 MED ORDER — PROPOFOL 500 MG/50ML IV EMUL
INTRAVENOUS | Status: DC | PRN
  Administered 2019-12-14: 20 mg via INTRAVENOUS
  Administered 2019-12-14: 130 ug/kg/min via INTRAVENOUS

## 2019-12-14 MED ORDER — LACTATED RINGERS IV SOLN: INTRAVENOUS | Status: DC

## 2019-12-14 SURGICAL SUPPLY — 14 items

## 2019-12-14 NOTE — Transfer of Care (Signed)
Immediate Anesthesia Transfer of Care Note  Patient: Carlos Gutierrez  Procedure(s) Performed: ESOPHAGOGASTRODUODENOSCOPY (EGD) WITH PROPOFOL (N/A )  Patient Location: PACU and Endoscopy Unit  Anesthesia Type:MAC  Level of Consciousness: awake, alert , oriented and patient cooperative  Airway & Oxygen Therapy: Patient Spontanous Breathing and Patient connected to face mask oxygen  Post-op Assessment: Report given to RN, Post -op Vital signs reviewed and stable and Patient moving all extremities  Post vital signs: Reviewed and stable  Last Vitals:  Vitals Value Taken Time  BP 111/65 12/14/19 1055  Temp    Pulse 51 12/14/19 1057  Resp 17 12/14/19 1057  SpO2 100 % 12/14/19 1057  Vitals shown include unvalidated device data.  Last Pain:  Vitals:   12/14/19 1024  TempSrc: Oral  PainSc: 0-No pain         Complications: No complications documented.

## 2019-12-14 NOTE — Discharge Instructions (Signed)

## 2019-12-14 NOTE — Anesthesia Postprocedure Evaluation (Signed)
Anesthesia Post Note  Patient: Carlos Gutierrez  Procedure(s) Performed: ESOPHAGOGASTRODUODENOSCOPY (EGD) WITH PROPOFOL (N/A )     Patient location during evaluation: PACU Anesthesia Type: MAC Level of consciousness: awake and alert and oriented Pain management: pain level controlled Vital Signs Assessment: post-procedure vital signs reviewed and stable Respiratory status: spontaneous breathing, nonlabored ventilation and respiratory function stable Cardiovascular status: stable and blood pressure returned to baseline Postop Assessment: no apparent nausea or vomiting Anesthetic complications: no   No complications documented.  Last Vitals:  Vitals:   12/14/19 1056 12/14/19 1106  BP: 111/65 120/67  Pulse: (!) 55 (!) 59  Resp: 15 (!) 21  Temp: 36.5 C   SpO2: 100% 98%    Last Pain:  Vitals:   12/14/19 1106  TempSrc:   PainSc: 0-No pain                 [name]

## 2019-12-14 NOTE — Anesthesia Preprocedure Evaluation (Addendum)
Anesthesia Evaluation  Patient identified by MRN, date of birth, ID band Patient awake    Reviewed: Allergy & Precautions, NPO status , Patient's Chart, lab work & pertinent test results, reviewed documented beta blocker date and time   Airway Mallampati: I  TM Distance: >3 FB Neck ROM: Full    Dental no notable dental hx. (+) Teeth Intact, Caps   Pulmonary sleep apnea and Continuous Positive Airway Pressure Ventilation ,    Pulmonary exam normal breath sounds clear to auscultation       Cardiovascular hypertension, Pt. on medications + angina with exertion + CAD  Normal cardiovascular exam+ dysrhythmias Atrial Fibrillation  Rhythm:Regular Rate:Normal  Hx/o atrial fibrillation/flutter S/P ablation  EKG 11/29/19 Sinus Bradycardia, 1st degree AV Block  Echo 04/18/19 1. Left ventricular ejection fraction, by visual estimation, is 50 to 55%. The left ventricle has normal function. There is no left ventricular hypertrophy.  2. Definity contrast agent was given IV to delineate the left ventricular endocardial borders.  3. The left ventricle has no regional wall motion abnormalities.  4. Left ventricular diastolic parameters are indeterminate.  5. Global right ventricle has normal systolic function.The right ventricular size is not well visualized. Right vetricular wall thickness was not assessed.  6. Left atrial size was normal.  7. Right atrial size was normal.  8. The mitral valve is normal in structure. No evidence of mitral valve regurgitation. No evidence of mitral stenosis.  9. The tricuspid valve is normal in structure. Tricuspid valve  regurgitation is trivial.  10. The aortic valve is grossly normal. Aortic valve regurgitation is not visualized. No evidence of aortic valve sclerosis or stenosis.  11. The pulmonic valve was normal in structure. Pulmonic valve regurgitation is trivial.  12. The inferior vena cava is normal  in size with greater than 50% respiratory variability, suggesting right atrial pressure of 3 mmHg.  13. TR signal is inadequate for assessing pulmonary artery systolic pressure.   Cardiac Cath 04/18/19  30 to 40% distal left main, which is new/progressed from 2018.  Luminal irregularities in the LAD mid and distal.  First obtuse marginal 50% tandem proximal to mid stenoses.  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  Unchanged compared to prior images.  Normal left ventricular function with EF 55%.  LVEDP normal.  Atrial fibrillation developed during the night.  He is asymptomatic and did not know he was in atrial fib.  Recommendations:  Continue aggressive risk factor modification.  Increase statin intensity to max dose atorvastatin, 80 mg/day.  Consider adding antiarrhythmic therapy continuously to control atrial for rhythm.  Perhaps some of his symptoms are related to undetected atrial fibrillation.    Neuro/Psych negative neurological ROS  negative psych ROS   GI/Hepatic Neg liver ROS, Hx/o diverticulosis Evaluation for esophageal varices Hx/o Gi Bleed   Endo/Other  diabetes, Well Controlled, Type 2, Oral Hypoglycemic AgentsObesity Hypercholesterolemia  Renal/GU negative Renal ROS  negative genitourinary   Musculoskeletal negative musculoskeletal ROS (+)   Abdominal (+) + obese,   Peds  Hematology  (+) anemia , Xarelto therapy Last dose   Anesthesia Other Findings   Reproductive/Obstetrics                           Anesthesia Physical Anesthesia Plan  ASA: III  Anesthesia Plan: MAC   Post-op Pain Management:    Induction: Intravenous  PONV Risk Score and Plan:  1 and Propofol infusion and Treatment may vary due to age or medical condition  Airway Management Planned: Natural Airway and Nasal Cannula  Additional Equipment:   Intra-op Plan:   Post-operative Plan:    Informed Consent: I have reviewed the patients History and Physical, chart, labs and discussed the procedure including the risks, benefits and alternatives for the proposed anesthesia with the patient or authorized representative who has indicated his/her understanding and acceptance.     Dental advisory given  Plan Discussed with: CRNA and Anesthesiologist  Anesthesia Plan Comments:        Anesthesia Quick Evaluation

## 2019-12-14 NOTE — H&P (Signed)
Carlos Gutierrez HPI: His last colonoscopy on 10/15/2008 was normal. An EGD was performed for complaints of dysphagia last year and the work up was normal. Dilation was performed with an 18 mm Savary dilator. 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 Remarkably he appears to be improving as he is able to walk with a walker. Compared to last year he was not ambulatory. 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 The IgA and TTG IgA obtained by his neurologist were normal.   Past Medical History:  Diagnosis Date  . ACL tear    RIGHT KNEE  . Atrial fibrillation ([place]) 2012   Rare occurrences. On ASA & Flecainide prn  . Atrial flutter ([place])    s/p ablation 2008. On Flecainide prn.  . Broken leg 1968  . Concussion 1966   History of 4  . Diabetes mellitus without complication ([place])   . DVT (deep venous thrombosis) ([facility])    LEFT LOWER LEG  . Dyslipidemia   . Essential hypertension, benign   . Insomnia   . Low back pain   . Obesity (BMI 30-39.9)   . Obstructive sleep apnea on CPAP    Mild  . PAF (paroxysmal atrial fibrillation) ([place])    Rare occurrences. On ASA & Flecainide prn  . Pure hypercholesterolemia    On Lipitor   . Right shoulder pain    Intermittent  . Status post ablation of atrial flutter 2008  . Umbilical hernia     Past Surgical History:  Procedure Laterality Date  . ablation for atrial flutter  01/2002  . CARDIAC CATHETERIZATION  12/26/01  . CARDIAC CATHETERIZATION  04/18/2019  . COLONOSCOPY WITH PROPOFOL N/A 03/12/2016   Procedure: COLONOSCOPY WITH PROPOFOL;  Surgeon: [name], MD;  [facility];  Service: Endoscopy;  Laterality: N/A;  . COLONOSCOPY WITH PROPOFOL N/A 05/05/2019   Procedure: COLONOSCOPY WITH PROPOFOL;  Surgeon: [name], MD;  Location: [place] [facility];  Service: Endoscopy;  Laterality: N/A;  . coronary angiography  1997   40% stenosis  . ESOPHAGOGASTRODUODENOSCOPY  (EGD) WITH PROPOFOL N/A 10/15/2016   Procedure: ESOPHAGOGASTRODUODENOSCOPY (EGD) WITH PROPOFOL;  Surgeon: [name], MD;  [facility];  Service: Endoscopy;  Laterality: N/A;  . LEFT HEART CATH AND CORONARY ANGIOGRAPHY N/A 02/11/2017   Procedure: LEFT HEART CATH AND CORONARY ANGIOGRAPHY;  Surgeon: Belva Crome, MD;  Location: [facility];  Service: Cardiovascular;  Laterality: N/A;  . LEFT HEART CATH AND CORONARY ANGIOGRAPHY N/A 04/18/2019   Procedure: LEFT HEART CATH AND CORONARY ANGIOGRAPHY;  Surgeon: Belva Crome, MD;  Location: [facility];  Service: Cardiovascular;  Laterality: N/A;  . POLYPECTOMY  05/05/2019   Procedure: POLYPECTOMY;  Surgeon: [name], MD;  Location: [facility] END[facility];  Service: Endoscopy;;  . right knee surgery  1996   ACL, meniscus problems  . UMBILICAL HERNIA REPAIR  04/2015    Family History  Problem Relation Age of Onset  . Hypertension Mother 15  . Diabetes Mother   . COPD Mother   . Heart disease Mother   . Heart disease Father 38  . Hypertension Father   . Heart failure Father   . Heart attack Father   . Diabetes Brother 56  . Hypertension Brother 54  . Heart attack Paternal Grandfather   . Colon cancer Maternal Grandfather 6  . Stroke Neg Hx     Social History:  reports that he has never smoked.  He has never used smokeless tobacco. He reports current alcohol use of about 9.0 - 12.0 standard drinks of alcohol per week. He reports that he does not use drugs.  Allergies:  Allergies  Allergen Reactions  . Metformin And Related Nausea And Vomiting and Other (See Comments)    Dizziness and disorientation, too    Medications:  Scheduled:  Continuous: . lactated ringers 10 mL/hr at 12/14/19 1039    Results for orders placed or performed during the hospital encounter of 12/14/19 (from the past 24 hour(s))  Glucose, capillary     Status: Abnormal   Collection Time: 12/14/19 10:32 AM  Result Value Ref Range    Glucose-Capillary 118 (H) 70 - 99 mg/dL     No results found.  ROS:  As stated above in the HPI otherwise negative.  Blood pressure (!) 144/84, pulse (!) 58, temperature 97.8 F (36.6 C), temperature source Oral, resp. rate (!) 21, height 6\' 1"  (1.854 m), weight 105.7 kg, SpO2 99 %.    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  [name] D 12/14/2019, 10:41 AM

## 2019-12-14 NOTE — Op Note (Addendum)
[facility] Patient Name: Carlos Gutierrez Procedure Date: 12/14/2019 MRN: [id number] Attending MD: [name] , MD Date of Birth: Jun 14, 1950 CSN: [id number] Age: 69 Admit Type: Outpatient Procedure:                Upper GI endoscopy Indications:              Heartburn Providers:                [name], MD, Carmie End, RN, Erenest Rasher, RN, Tegh Dalton, Technician Referring MD:              Medicines:                Propofol per Anesthesia Complications:            No immediate complications. Estimated Blood Loss:     Estimated blood loss: none. Procedure:                Pre-Anesthesia Assessment:                           - Prior to the procedure, a History and Physical                            was performed, and patient medications and                            allergies were reviewed. The patient's tolerance of                            previous anesthesia was also reviewed. The risks                            and benefits of the procedure and the sedation                            options and risks were discussed with the patient.                            All questions were answered, and informed consent                            was obtained. Prior Anticoagulants: The patient has                            taken no previous anticoagulant or antiplatelet                            agents. ASA Grade Assessment: III - A patient with                            severe systemic disease. After reviewing the risks  and benefits, the patient was deemed in                            satisfactory condition to undergo the procedure.                           - Sedation was administered by an anesthesia                            professional. Deep sedation was attained.                           After obtaining informed consent, the endoscope was                            passed under direct  vision. Throughout the                            procedure, the patient's blood pressure, pulse, and                            oxygen saturations were monitored continuously. The                            GIF-H190 ([id number] Olympus gastroscope was                            introduced through the mouth, and advanced to the                            second part of duodenum. The upper GI endoscopy was                            accomplished without difficulty. The patient                            tolerated the procedure well. Scope In: Scope Out: Findings:      The esophagus was normal. No evidence of esophageal varices.      Mild portal hypertensive gastropathy was found in the gastric fundus and       in the gastric body. No evidence of fundic varices.      The examined duodenum was normal. Impression:               - Normal esophagus.                           - Portal hypertensive gastropathy.                           - Normal examined duodenum.                           - No specimens collected. Moderate Sedation:      Not Applicable - Patient had care per Anesthesia. Recommendation:           -  Patient has a contact number available for                            emergencies. The signs and symptoms of potential                            delayed complications were discussed with the                            patient. Return to normal activities tomorrow.                            Written discharge instructions were provided to the                            patient.                           - Resume previous diet.                           - Continue present medications.                           - Repeat upper endoscopy in 3 years for                            surveillance. Procedure Code(s):        --- Professional ---                           [phone], Esophagogastroduodenoscopy, flexible,                            transoral; diagnostic, including collection of                             specimen(s) by brushing or washing, when performed                            (separate procedure) Diagnosis Code(s):        --- Professional ---                           K76.6, Portal hypertension                           K31.89, Other diseases of stomach and duodenum                           R12, Heartburn CPT copyright 2019 American Medical Association. All rights reserved. The codes documented in this report are preliminary and upon coder review may  be revised to meet current compliance requirements. [name], MD [name], MD 12/14/2019 10:56:21 AM This report has been signed electronically. Number of Addenda: 0

## 2019-12-18 ENCOUNTER — Encounter (HOSPITAL_COMMUNITY): Payer: Self-pay | Admitting: Gastroenterology

## 2019-12-26 NOTE — Progress Notes (Signed)
Primary Care Physician: London Pepper, MD Primary Cardiologist: Dr Tamala Julian Primary Electrophysiologist: Dr Lovena Le Referring Physician: Dr Theresa Duty is a 69 y.o. male with a history of CAD (non-obstructive), HTN, HLD, DM, AFlutter (ablated 2003), AFib, DVT, OSA w/CPAP who presents for follow up in the [facility]. Patient is on Xarelto for a CHADS2VASC score of 4. Patient admitted for cardiac catheterization on 04/18/19 for progressive SOB. LHC showed diffuse coronary plaquing with a 70% narrowing in the RCA which is unchanged from 2018. Given patient's CAD, flecainide was stopped and patient was loaded on dofetilide. Patient was hospitalized 04/2019 with a diverticular bleed. Anticoagulation was temporarily held. He has not had any further bleeding issues.   On follow up today, patient reports he has done reasonably well since his last visit. He continues to have 1-2 rate controlled episodes of afib per month. He is satisfied with his present therapy. He denies any bleeding issues on anticoagulation. He does reports significant stress recently with his wife's health (brain surgery).   Today, he denies symptoms of chest pain, shortness of breath, orthopnea, PND, lower extremity edema, dizziness, presyncope, syncope, bleeding, or neurologic sequela. The patient is tolerating medications without difficulties and is otherwise without complaint today.    Atrial Fibrillation Risk Factors:  he does have symptoms or diagnosis of sleep apnea. he is compliant with CPAP therapy. he does not have a history of rheumatic fever.   he has a BMI of Body mass index is 30.61 kg/m.[name] Filed Weights   12/27/19 1029  Weight: 105.2 kg    Family History  Problem Relation Age of Onset  . Hypertension Mother 37  . Diabetes Mother   . COPD Mother   . Heart disease Mother   . Heart disease Father 71  . Hypertension Father   . Heart failure Father   . Heart attack  Father   . Diabetes Brother 77  . Hypertension Brother 46  . Heart attack Paternal Grandfather   . Colon cancer Maternal Grandfather 65  . Stroke Neg Hx      Atrial Fibrillation Management history:  Previous antiarrhythmic drugs: flecainide, dofetilide Previous cardioversions: none Previous ablations: 2003 flutter CHADS2VASC score: 4 Anticoagulation history: Xarelto   Past Medical History:  Diagnosis Date  . ACL tear    RIGHT KNEE  . Atrial fibrillation ([place]) 2012   Rare occurrences. On ASA & Flecainide prn  . Atrial flutter ([place])    s/p ablation 2008. On Flecainide prn.  . Broken leg 1968  . Concussion 1966   History of 4  . Diabetes mellitus without complication ([place])   . DVT (deep venous thrombosis) ([facility])    LEFT LOWER LEG  . Dyslipidemia   . Essential hypertension, benign   . Insomnia   . Low back pain   . Obesity (BMI 30-39.9)   . Obstructive sleep apnea on CPAP    Mild  . PAF (paroxysmal atrial fibrillation) ([place])    Rare occurrences. On ASA & Flecainide prn  . Pure hypercholesterolemia    On Lipitor   . Right shoulder pain    Intermittent  . Status post ablation of atrial flutter 2008  . Umbilical hernia    Past Surgical History:  Procedure Laterality Date  . ablation for atrial flutter  01/2002  . CARDIAC CATHETERIZATION  12/26/01  . CARDIAC CATHETERIZATION  04/18/2019  . COLONOSCOPY WITH PROPOFOL N/A 03/12/2016   Procedure: COLONOSCOPY WITH PROPOFOL;  Surgeon: Saralyn Pilar  Benson Norway, MD;  Location: [facility];  Service: Endoscopy;  Laterality: N/A;  . COLONOSCOPY WITH PROPOFOL N/A 05/05/2019   Procedure: COLONOSCOPY WITH PROPOFOL;  Surgeon: [name], MD;  Location: [place];  Service: Endoscopy;  Laterality: N/A;  . coronary angiography  1997   40% stenosis  . ESOPHAGOGASTRODUODENOSCOPY (EGD) WITH PROPOFOL N/A 10/15/2016   Procedure: ESOPHAGOGASTRODUODENOSCOPY (EGD) WITH PROPOFOL;  Surgeon: [name], MD;  Location: [facility];  Service:  Endoscopy;  Laterality: N/A;  . ESOPHAGOGASTRODUODENOSCOPY (EGD) WITH PROPOFOL N/A 12/14/2019   Procedure: ESOPHAGOGASTRODUODENOSCOPY (EGD) WITH PROPOFOL;  Surgeon: [name], MD;  Location: [facility];  Service: Endoscopy;  Laterality: N/A;  . LEFT HEART CATH AND CORONARY ANGIOGRAPHY N/A 02/11/2017   Procedure: LEFT HEART CATH AND CORONARY ANGIOGRAPHY;  Surgeon: Belva Crome, MD;  Location: [facility];  Service: Cardiovascular;  Laterality: N/A;  . LEFT HEART CATH AND CORONARY ANGIOGRAPHY N/A 04/18/2019   Procedure: LEFT HEART CATH AND CORONARY ANGIOGRAPHY;  Surgeon: Belva Crome, MD;  Location: [facility];  Service: Cardiovascular;  Laterality: N/A;  . POLYPECTOMY  05/05/2019   Procedure: POLYPECTOMY;  Surgeon: [name], MD;  Location: [facility];  Service: Endoscopy;;  . right knee surgery  1996   ACL, meniscus problems  . UMBILICAL HERNIA REPAIR  04/2015    Current Outpatient Medications  Medication Sig Dispense Refill  . acetaminophen (TYLENOL) 500 MG tablet Take 1,000 mg by mouth as needed for mild pain, moderate pain or headache.     . Ascorbic Acid (VITAMIN C) 1000 MG tablet Take 1,000 mg by mouth daily.    [name] aspirin EC 81 MG tablet Take 1 tablet (81 mg total) by mouth daily. 30 tablet 0  . atorvastatin (LIPITOR) 80 MG tablet Take 1 tablet (80 mg total) by mouth daily at 6 PM. 90 tablet 2  . cetirizine (ZYRTEC) 10 MG tablet Take 10 mg by mouth at bedtime as needed for allergies.    . cholecalciferol (VITAMIN D3) 25 MCG (1000 UNIT) tablet Take 1,000 Units by mouth daily.    . clindamycin (CLEOCIN T) 1 % SWAB Apply 1 application topically 2 (two) times daily.    [name] dofetilide (TIKOSYN) 500 MCG capsule TAKE (1) CAPSULE TWICE DAILY. 60 capsule 11  . glipiZIDE (GLUCOTROL XL) 2.5 MG 24 hr tablet Take 2.5 mg by mouth daily with breakfast.    . loperamide (IMODIUM A-D) 2 MG tablet Take 2 mg by mouth as needed for diarrhea or loose stools.     . melatonin 3 MG  TABS tablet Take 3 mg by mouth at bedtime.    . Multiple Vitamins-Minerals (MULTIVITAMIN WITH MINERALS) tablet Take 1 tablet by mouth daily.      . nitroGLYCERIN (NITROSTAT) 0.4 MG SL tablet Place 0.4 mg under the tongue every 5 (five) minutes as needed for chest pain.    [name] olmesartan (BENICAR) 20 MG tablet Take 1 tablet (20 mg total) by mouth daily.    . pantoprazole (PROTONIX) 40 MG tablet TAKE 1 TABLET BY MOUTH TWICE DAILY. 180 tablet 3  . potassium chloride SA (KLOR-CON) 20 MEQ tablet Take 1 tablet (20 mEq total) by mouth daily. 90 tablet 2  . traMADol (ULTRAM) 50 MG tablet Take 50 mg by mouth 3 (three) times daily as needed for moderate pain.     [name] XARELTO 20 MG TABS tablet TAKE 1 TABLET ONCE DAILY WITH DINNER. 60 tablet 5   No current facility-administered medications for this encounter.  Allergies  Allergen Reactions  . Metformin And Related Nausea And Vomiting and Other (See Comments)    Dizziness and disorientation, too    Social History   Socioeconomic History  . Marital status: Married    Spouse name: Not on file  . Number of children: 2  . Years of education: Not on file  . Highest education level: Not on file  Occupational History  . Occupation: Scientist, clinical (histocompatibility and immunogenetics): OTHER  Tobacco Use  . Smoking status: Never Smoker  . Smokeless tobacco: Never Used  Vaping Use  . Vaping Use: Never used  Substance and Sexual Activity  . Alcohol use: Yes    Alcohol/week: 9.0 - 12.0 standard drinks    Types: 7 - 10 Glasses of wine, 2 Standard drinks or equivalent per week  . Drug use: No  . Sexual activity: Not on file  Other Topics Concern  . Not on file  Social History Narrative   Caffeine: yes, coffee 2-3 cups daily, tea-rare.    Exercise-yes, 2-3X weekly, jog/run.    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    Marital Status: Married.    Children: 2 children-grown               Social  Determinants of Health   Financial Resource Strain:   . Difficulty of Paying Living Expenses: Not on file  Food Insecurity:   . Worried About Charity fundraiser in the Last Year: Not on file  . Ran Out of Food in the Last Year: Not on file  Transportation Needs:   . Lack of Transportation (Medical): Not on file  . Lack of Transportation (Non-Medical): Not on file  Physical Activity:   . Days of Exercise per Week: Not on file  . Minutes of Exercise per Session: Not on file  Stress:   . Feeling of Stress : Not on file  Social Connections:   . Frequency of Communication with Friends and Family: Not on file  . Frequency of Social Gatherings with Friends and Family: Not on file  . Attends Religious Services: Not on file  . Active Member of Clubs or Organizations: Not on file  . Attends Archivist Meetings: Not on file  . Marital Status: Not on file  Intimate Partner Violence:   . Fear of Current or Ex-Partner: Not on file  . Emotionally Abused: Not on file  . Physically Abused: Not on file  . Sexually Abused: Not on file     ROS- All systems are reviewed and negative except as per the HPI above.  Physical Exam: Vitals:   12/27/19 1029  BP: 122/78  Pulse: (!) 53  Weight: 105.2 kg  Height: 6\' 1"  (1.854 m)    GEN- The patient is well appearing obese male, alert and oriented x 3 today.   HEENT-head normocephalic, atraumatic, sclera clear, conjunctiva pink, hearing intact, trachea midline. Lungs- Clear to ausculation bilaterally, normal work of breathing Heart- Regular rate and rhythm, bradycardia, no murmurs, rubs or gallops  GI- soft, NT, ND, + BS Extremities- no clubbing, cyanosis, or edema MS- no significant deformity or atrophy Skin- no rash or lesion Psych- euthymic mood, full affect Neuro- strength and sensation are intact   Wt Readings from Last 3 Encounters:  12/27/19 105.2 kg  12/14/19 105.7 kg  11/29/19 105.7 kg    EKG today demonstrates SB HR  53, 1st degree AV block, PR 252, QRS 102,  QTc 431  Echo 04/18/19 demonstrated  1. Left ventricular ejection fraction, by visual estimation, is 50 to  55%. The left ventricle has normal function. There is no left ventricular  hypertrophy.  2. Definity contrast agent was given IV to delineate the left ventricular  endocardial borders.  3. The left ventricle has no regional wall motion abnormalities.  4. Left ventricular diastolic parameters are indeterminate.  5. Global right ventricle has normal systolic function.The right  ventricular size is not well visualized. Right vetricular wall thickness  was not assessed.  6. Left atrial size was normal.  7. Right atrial size was normal.  8. The mitral valve is normal in structure. No evidence of mitral valve  regurgitation. No evidence of mitral stenosis.  9. The tricuspid valve is normal in structure. Tricuspid valve  regurgitation is trivial.  10. The aortic valve is grossly normal. Aortic valve regurgitation is not  visualized. No evidence of aortic valve sclerosis or stenosis.  11. The pulmonic valve was normal in structure. Pulmonic valve  regurgitation is trivial.  12. The inferior vena cava is normal in size with greater than 50%  respiratory variability, suggesting right atrial pressure of 3 mmHg.  13. TR signal is inadequate for assessing pulmonary artery systolic  pressure.   Epic records are reviewed at length today  CHA2DS2-VASc Score = 4  The patient's score is based upon: CHF History: 0 HTN History: 1 Diabetes History: 1 Stroke History: 0 Vascular Disease History: 1     ASSESSMENT AND PLAN: 1. Paroxysmal Atrial Fibrillation (ICD10:  I48.0) The patient's CHA2DS2-VASc score is 4, indicating a 4.8% annual risk of stroke.   S/p dofetilide loading 2/4-04/22/19 Will continue present therapy for now. Can consider ablation if his afib becomes more persistent.  Continue dofetilide 500 mcg BID. QT stable. Check bmet/mag  today. Continue Xarelto 20 mg daily  2. Secondary Hypercoagulable State (ICD10:  D68.69) The patient is at significant risk for stroke/thromboembolism based upon his CHA2DS2-VASc Score of 4.  Continue Rivaroxaban (Xarelto).   3. Obstructive sleep apnea Patient compliant with CPAP therapy.  4. CAD No anginal symptoms.   5. HTN Stable, no changes today.   Follow up with Dr Lovena Le as scheduled. AF clinic in 9 months.    [facility] [address] [place], [place] [address] [phone] 12/27/2019 11:01 AM

## 2019-12-27 ENCOUNTER — Other Ambulatory Visit: Payer: Self-pay

## 2019-12-27 ENCOUNTER — Ambulatory Visit (HOSPITAL_COMMUNITY)
Admission: RE | Admit: 2019-12-27 | Discharge: 2019-12-27 | Disposition: A | Discharge status: Home / Self Care | Payer: BC Managed Care – PPO | Source: Ambulatory Visit | Attending: Physician Assistant | Admitting: Physician Assistant

## 2019-12-27 ENCOUNTER — Encounter (HOSPITAL_COMMUNITY): Payer: Self-pay | Admitting: Physician Assistant

## 2019-12-27 VITALS — BP 122/78 | HR 53 | Ht 73.0 in | Wt 232.0 lb

## 2019-12-27 DIAGNOSIS — Z7984 Long term (current) use of oral hypoglycemic drugs: Secondary | ICD-10-CM | POA: Insufficient documentation

## 2019-12-27 DIAGNOSIS — Z9989 Dependence on other enabling machines and devices: Secondary | ICD-10-CM | POA: Diagnosis not present

## 2019-12-27 DIAGNOSIS — E7800 Pure hypercholesterolemia, unspecified: Secondary | ICD-10-CM | POA: Insufficient documentation

## 2019-12-27 DIAGNOSIS — Z7901 Long term (current) use of anticoagulants: Secondary | ICD-10-CM | POA: Diagnosis not present

## 2019-12-27 DIAGNOSIS — Z7982 Long term (current) use of aspirin: Secondary | ICD-10-CM | POA: Insufficient documentation

## 2019-12-27 DIAGNOSIS — Z79899 Other long term (current) drug therapy: Secondary | ICD-10-CM | POA: Insufficient documentation

## 2019-12-27 DIAGNOSIS — Z86718 Personal history of other venous thrombosis and embolism: Secondary | ICD-10-CM | POA: Diagnosis not present

## 2019-12-27 DIAGNOSIS — I2510 Atherosclerotic heart disease of native coronary artery without angina pectoris: Secondary | ICD-10-CM | POA: Diagnosis not present

## 2019-12-27 DIAGNOSIS — E785 Hyperlipidemia, unspecified: Secondary | ICD-10-CM | POA: Insufficient documentation

## 2019-12-27 DIAGNOSIS — I480 Paroxysmal atrial fibrillation: Secondary | ICD-10-CM

## 2019-12-27 DIAGNOSIS — D6869 Other thrombophilia: Secondary | ICD-10-CM

## 2019-12-27 DIAGNOSIS — G4733 Obstructive sleep apnea (adult) (pediatric): Secondary | ICD-10-CM | POA: Diagnosis not present

## 2019-12-27 DIAGNOSIS — I10 Essential (primary) hypertension: Secondary | ICD-10-CM | POA: Insufficient documentation

## 2019-12-27 DIAGNOSIS — E669 Obesity, unspecified: Secondary | ICD-10-CM | POA: Diagnosis not present

## 2019-12-27 DIAGNOSIS — E119 Type 2 diabetes mellitus without complications: Secondary | ICD-10-CM | POA: Insufficient documentation

## 2019-12-27 DIAGNOSIS — Z6830 Body mass index (BMI) 30.0-30.9, adult: Secondary | ICD-10-CM | POA: Diagnosis not present

## 2019-12-27 LAB — BASIC METABOLIC PANEL
Anion gap: 10 (ref 5–15)
BUN: 18 mg/dL (ref 8–23)
CO2: 21 mmol/L — ABNORMAL LOW (ref 22–32)
Calcium: 9.7 mg/dL (ref 8.9–10.3)
Chloride: 106 mmol/L (ref 98–111)
Creatinine, Ser: 1.03 mg/dL (ref 0.61–1.24)
GFR, Estimated: >60 mL/min (ref >60)
Glucose, Bld: 144 mg/dL — ABNORMAL HIGH (ref 70–99)
Potassium: 4.3 mmol/L (ref 3.5–5.1)
Sodium: 137 mmol/L (ref 135–145)

## 2019-12-27 LAB — MAGNESIUM: Magnesium: 2.1 mg/dL (ref 1.7–2.4)

## 2019-12-31 ENCOUNTER — Encounter: Payer: Medicare Other | Admitting: Orthotics

## 2020-01-07 ENCOUNTER — Other Ambulatory Visit: Payer: Self-pay | Admitting: Cardiology

## 2020-01-07 MED ORDER — OLMESARTAN MEDOXOMIL 20 MG PO TABS
20.0000 mg | ORAL_TABLET | Freq: Every day | ORAL | 1 refills | Status: DC
Start: 2020-01-07 — End: 2020-05-13

## 2020-01-17 ENCOUNTER — Ambulatory Visit: Payer: Medicare Other | Admitting: Orthotics

## 2020-01-17 ENCOUNTER — Other Ambulatory Visit: Payer: Self-pay

## 2020-01-17 DIAGNOSIS — M21619 Bunion of unspecified foot: Secondary | ICD-10-CM

## 2020-01-17 DIAGNOSIS — M21961 Unspecified acquired deformity of right lower leg: Secondary | ICD-10-CM

## 2020-01-17 DIAGNOSIS — M779 Enthesopathy, unspecified: Secondary | ICD-10-CM

## 2020-01-17 NOTE — Progress Notes (Signed)
Patient came in today to pick up custom made foot orthotics.  The goals were accomplished and the patient reported no dissatisfaction with said orthotics.  Patient was advised of breakin period and how to report any issues. 

## 2020-02-18 DIAGNOSIS — I10 Essential (primary) hypertension: Secondary | ICD-10-CM | POA: Diagnosis not present

## 2020-02-18 DIAGNOSIS — H35033 Hypertensive retinopathy, bilateral: Secondary | ICD-10-CM | POA: Diagnosis not present

## 2020-03-13 DIAGNOSIS — D225 Melanocytic nevi of trunk: Secondary | ICD-10-CM | POA: Diagnosis not present

## 2020-03-13 DIAGNOSIS — L821 Other seborrheic keratosis: Secondary | ICD-10-CM | POA: Diagnosis not present

## 2020-03-13 DIAGNOSIS — L570 Actinic keratosis: Secondary | ICD-10-CM | POA: Diagnosis not present

## 2020-03-13 DIAGNOSIS — L905 Scar conditions and fibrosis of skin: Secondary | ICD-10-CM | POA: Diagnosis not present

## 2020-03-13 DIAGNOSIS — Z85828 Personal history of other malignant neoplasm of skin: Secondary | ICD-10-CM | POA: Diagnosis not present

## 2020-05-09 ENCOUNTER — Other Ambulatory Visit: Payer: Self-pay | Admitting: Interventional Cardiology

## 2020-05-13 ENCOUNTER — Other Ambulatory Visit: Payer: Self-pay | Admitting: Interventional Cardiology

## 2020-05-26 ENCOUNTER — Ambulatory Visit: Payer: BC Managed Care – PPO | Admitting: Internal Medicine

## 2020-05-26 ENCOUNTER — Encounter: Payer: Self-pay | Admitting: Internal Medicine

## 2020-05-26 ENCOUNTER — Other Ambulatory Visit: Payer: Self-pay

## 2020-05-26 VITALS — BP 130/82 | HR 51 | Ht 73.0 in | Wt 247.0 lb

## 2020-05-26 DIAGNOSIS — I10 Essential (primary) hypertension: Secondary | ICD-10-CM | POA: Diagnosis not present

## 2020-05-26 DIAGNOSIS — I480 Paroxysmal atrial fibrillation: Secondary | ICD-10-CM

## 2020-05-26 DIAGNOSIS — I2510 Atherosclerotic heart disease of native coronary artery without angina pectoris: Secondary | ICD-10-CM

## 2020-05-26 NOTE — Progress Notes (Signed)
HPI [name] returns today for followup of atrial fib on dofetilide. He is a pleasant 70 yo man who was started on dofetilide about 12 months ago. He has had 2 episodes of atrial fib a month, each lasting 1-2 days. No other complaints. He uses CPAP for sleep apnea but admits to trouble with compliance. He denies caffeine or ETOH excess. He has not had chest pain or sob. He does not think that his HR is particularly fast in atrial fib. He has been caring for his wife who has brain CA. Allergies  Allergen Reactions  . Metformin And Related Nausea And Vomiting and Other (See Comments)    Dizziness and disorientation, too     Current Outpatient Medications  Medication Sig Dispense Refill  . acetaminophen (TYLENOL) 500 MG tablet Take 1,000 mg by mouth as needed for mild pain, moderate pain or headache.     . Ascorbic Acid (VITAMIN C) 1000 MG tablet Take 1,000 mg by mouth daily.    [name] aspirin EC 81 MG tablet Take 1 tablet (81 mg total) by mouth daily. 30 tablet 0  . atorvastatin (LIPITOR) 80 MG tablet Take 1 tablet (80 mg total) by mouth daily at 6 PM. Please make overdue appt with Dr. Tamala Julian before anymore refills. Thank you 1st attempt 30 tablet 0  . cetirizine (ZYRTEC) 10 MG tablet Take 10 mg by mouth at bedtime as needed for allergies.    . cholecalciferol (VITAMIN D3) 25 MCG (1000 UNIT) tablet Take 1,000 Units by mouth daily.    . clindamycin (CLEOCIN T) 1 % SWAB Apply 1 application topically 2 (two) times daily.    [name] dofetilide (TIKOSYN) 500 MCG capsule TAKE (1) CAPSULE TWICE DAILY. 60 capsule 11  . glipiZIDE (GLUCOTROL XL) 2.5 MG 24 hr tablet Take 2.5 mg by mouth daily with breakfast.    . loperamide (IMODIUM A-D) 2 MG tablet Take 2 mg by mouth as needed for diarrhea or loose stools.     . melatonin 3 MG TABS tablet Take 3 mg by mouth at bedtime.    . Multiple Vitamins-Minerals (MULTIVITAMIN WITH MINERALS) tablet Take 1 tablet by mouth daily.    . nitroGLYCERIN (NITROSTAT) 0.4 MG SL  tablet Place 0.4 mg under the tongue every 5 (five) minutes as needed for chest pain.    [name] olmesartan (BENICAR) 20 MG tablet Take 1 tablet (20 mg total) by mouth daily. Pt must keep upcoming appt in March for further refills 30 tablet 1  . pantoprazole (PROTONIX) 40 MG tablet TAKE 1 TABLET BY MOUTH TWICE DAILY. 180 tablet 3  . potassium chloride SA (KLOR-CON) 20 MEQ tablet Take 1 tablet (20 mEq total) by mouth daily. Please make overdue appt with Dr. Tamala Julian before anymore refills. Thank you 1st attempt 30 tablet 0  . XARELTO 20 MG TABS tablet TAKE 1 TABLET ONCE DAILY WITH DINNER. 60 tablet 5   No current facility-administered medications for this visit.     Past Medical History:  Diagnosis Date  . ACL tear    RIGHT KNEE  . Atrial fibrillation ([place]) 2012   Rare occurrences. On ASA & Flecainide prn  . Atrial flutter ([place])    s/p ablation 2008. On Flecainide prn.  . Broken leg 1968  . Concussion 1966   History of 4  . Diabetes mellitus without complication ([place])   . DVT (deep venous thrombosis) ([facility])    LEFT LOWER LEG  . Dyslipidemia   . Essential hypertension,  benign   . Insomnia   . Low back pain   . Obesity (BMI 30-39.9)   . Obstructive sleep apnea on CPAP    Mild  . PAF (paroxysmal atrial fibrillation) ([place])    Rare occurrences. On ASA & Flecainide prn  . Pure hypercholesterolemia    On Lipitor   . Right shoulder pain    Intermittent  . Status post ablation of atrial flutter 2008  . Umbilical hernia     ROS:   All systems reviewed and negative except as noted in the HPI.   Past Surgical History:  Procedure Laterality Date  . ablation for atrial flutter  01/2002  . CARDIAC CATHETERIZATION  12/26/01  . CARDIAC CATHETERIZATION  04/18/2019  . COLONOSCOPY WITH PROPOFOL N/A 03/12/2016   Procedure: COLONOSCOPY WITH PROPOFOL;  Surgeon: [name], MD;  Location: [facility];  Service: Endoscopy;  Laterality: N/A;  . COLONOSCOPY WITH PROPOFOL N/A 05/05/2019   Procedure:  COLONOSCOPY WITH PROPOFOL;  Surgeon: [name], MD;  Location: [facility];  Service: Endoscopy;  Laterality: N/A;  . coronary angiography  1997   40% stenosis  . ESOPHAGOGASTRODUODENOSCOPY (EGD) WITH PROPOFOL N/A 10/15/2016   Procedure: ESOPHAGOGASTRODUODENOSCOPY (EGD) WITH PROPOFOL;  Surgeon: [name], MD;  Location: [facility];  Service: Endoscopy;  Laterality: N/A;  . ESOPHAGOGASTRODUODENOSCOPY (EGD) WITH PROPOFOL N/A 12/14/2019   Procedure: ESOPHAGOGASTRODUODENOSCOPY (EGD) WITH PROPOFOL;  Surgeon: [name], MD;  Location: [facility];  Service: Endoscopy;  Laterality: N/A;  . LEFT HEART CATH AND CORONARY ANGIOGRAPHY N/A 02/11/2017   Procedure: LEFT HEART CATH AND CORONARY ANGIOGRAPHY;  Surgeon: Belva Crome, MD;  Location: [facility];  Service: Cardiovascular;  Laterality: N/A;  . LEFT HEART CATH AND CORONARY ANGIOGRAPHY N/A 04/18/2019   Procedure: LEFT HEART CATH AND CORONARY ANGIOGRAPHY;  Surgeon: Belva Crome, MD;  Location: [facility];  Service: Cardiovascular;  Laterality: N/A;  . POLYPECTOMY  05/05/2019   Procedure: POLYPECTOMY;  Surgeon: [name], MD;  [facility];  Service: Endoscopy;;  . right knee surgery  1996   ACL, meniscus problems  . UMBILICAL HERNIA REPAIR  04/2015     Family History  Problem Relation Age of Onset  . Hypertension Mother 29  . Diabetes Mother   . COPD Mother   . Heart disease Mother   . Heart disease Father 57  . Hypertension Father   . Heart failure Father   . Heart attack Father   . Diabetes Brother 106  . Hypertension Brother 23  . Heart attack Paternal Grandfather   . Colon cancer Maternal Grandfather 44  . Stroke Neg Hx      Social History   Socioeconomic History  . Marital status: Married    Spouse name: Not on file  . Number of children: 2  . Years of education: Not on file  . Highest education level: Not on file  Occupational History  . Occupation: Scientist, clinical (histocompatibility and immunogenetics):  [employer]  Tobacco Use  . Smoking status: Never Smoker  . Smokeless tobacco: Never Used  Vaping Use  . Vaping Use: Never used  Substance and Sexual Activity  . Alcohol use: Yes    Alcohol/week: 9.0 - 12.0 standard drinks    Types: 7 - 10 Glasses of wine, 2 Standard drinks or equivalent per week  . Drug use: No  . Sexual activity: Not on file  Other Topics Concern  . Not on file  Social History Narrative   Caffeine: yes,  coffee 2-3 cups daily, tea-rare.    Exercise-yes, 2-3X weekly, jog/run.    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    Marital Status: Married.    Children: 2 children-grown               Social Determinants of Radio broadcast assistant Strain: Not on file  Food Insecurity: Not on file  Transportation Needs: Not on file  Physical Activity: Not on file  Stress: Not on file  Social Connections: Not on file  Intimate Partner Violence: Not on file     BP 130/82   Pulse (!) 51   Ht 6\' 1"  (1.854 m)   Wt 247 lb (112 kg)   SpO2 96%   BMI 32.59 kg/m   Physical Exam:  Well appearing 70 yo man, NAD HEENT: Unremarkable Neck:  No JVD, no thyromegally Lymphatics:  No adenopathy Back:  No CVA tenderness Lungs:  Clear with no wheezes HEART:  Regular rate rhythm, no murmurs, no rubs, no clicks Abd:  soft, positive bowel sounds, no organomegally, no rebound, no guarding Ext:  2 plus pulses, no edema, no cyanosis, no clubbing Skin:  No rashes no nodules Neuro:  CN II through XII intact, motor grossly intact  EKG - NSr   Assess/Plan: 1. PAF - his symptoms have worsened and are lasting longer. He is interested in pursuing ablation and I will refer him for PVI. 2. Atrial flutter - he is s/p ablation remotely with no recurrent flutter. 3. HTN - he will continue his current meds. 4. Coags - he has not had any bleeding on Xarelto. Continue.  Carleene Overlie Taylor,MD

## 2020-05-26 NOTE — Patient Instructions (Addendum)
Medication Instructions:  Your physician recommends that you continue on your current medications as directed. Please refer to the Current Medication list given to you today.  Labwork: None ordered.  Testing/Procedures: None ordered.  Follow-Up: Your physician wants you to follow-up in: one year with Cristopher Peru, MD or one of the following Advanced Practice Providers on your designated Care Team:    Chanetta Marshall, NP  Tommye Standard, PA-C  Legrand Como "Jonni Sanger" Chalmers Cater, [place]  Any Other Special Instructions Will Be Listed Below (If Applicable).  You are being referred to Dr. Lars Mage for atrial fibrillation  If you need a refill on your cardiac medications before your next appointment, please call your pharmacy.

## 2020-05-27 ENCOUNTER — Ambulatory Visit: Payer: BC Managed Care – PPO | Admitting: Cardiology

## 2020-05-27 ENCOUNTER — Encounter: Payer: Self-pay | Admitting: Cardiology

## 2020-05-27 VITALS — BP 128/84 | HR 61 | Ht 73.0 in | Wt 237.0 lb

## 2020-05-27 DIAGNOSIS — I480 Paroxysmal atrial fibrillation: Secondary | ICD-10-CM | POA: Diagnosis not present

## 2020-05-27 DIAGNOSIS — I10 Essential (primary) hypertension: Secondary | ICD-10-CM | POA: Diagnosis not present

## 2020-05-27 DIAGNOSIS — I4892 Unspecified atrial flutter: Secondary | ICD-10-CM

## 2020-05-27 DIAGNOSIS — Z01812 Encounter for preprocedural laboratory examination: Secondary | ICD-10-CM

## 2020-05-27 NOTE — Patient Instructions (Signed)
Medication Instructions:  Your physician recommends that you continue on your current medications as directed. Please refer to the Current Medication list given to you today.  *If you need a refill on your cardiac medications before your next appointment, please call your pharmacy*   Lab Work: Pre procedure labs on 4/18:  BMP & CBC.  You do NOT need to be fasting.  You can stop by the office anytime between 7:30 am - 4:30 pm If you have labs (blood work) drawn today and your tests are completely normal, you will receive your results only by: [name] MyChart Message (if you have MyChart) OR . A paper copy in the mail If you have any lab test that is abnormal or we need to change your treatment, we will call you to review the results.   Testing/Procedures: Your physician has requested that you have cardiac CT within 7 days PRIOR to your ablation. Cardiac computed tomography (CT) is a painless test that uses an x-ray machine to take clear, detailed pictures of your heart.  Please follow instruction below located under "other instructions". You will get a call from our office to schedule the date for this test.  Your physician has recommended that you have an ablation. Catheter ablation is a medical procedure used to treat some cardiac arrhythmias (irregular heartbeats). During catheter ablation, a long, thin, flexible tube is put into a blood vessel in your groin (upper thigh), or neck. This tube is called an ablation catheter. It is then guided to your heart through the blood vessel. Radio frequency waves destroy small areas of heart tissue where abnormal heartbeats may cause an arrhythmia to start. Please follow instruction below located under "other instructions".   Follow-Up: At [facility], you and your health needs are our priority.  As part of our continuing mission to provide you with exceptional heart care, we have created designated Provider Care Teams.  These Care Teams include your  primary Cardiologist (physician) and Advanced Practice Providers (APPs -  Physician Assistants and Nurse Practitioners) who all work together to provide you with the care you need, when you need it.  Your next appointment:   1 month(s) after your ablation  The format for your next appointment:   In Person  Provider:    AFib clinic   Thank you for choosing [facility]!!   [phone]    Other Instructions   CT INSTRUCTIONS Your cardiac CT will be scheduled at:  [facility] [address] [place], [place] [address] [phone]   Please arrive at the [facility] main entrance of [facility] at ________________ on _________________, please arrive 30 minutes prior to test start time. Proceed to the [facility] Radiology Department (first floor) to check-in and test prep.  Please follow these instructions carefully (unless otherwise directed):  Hold all erectile dysfunction medications at least 3 days (72 hrs) prior to test.  On the Night Before the Test: . Be sure to Drink plenty of water. . Do not consume any caffeinated/decaffeinated beverages or chocolate 12 hours prior to your test. . Do not take any antihistamines 12 hours prior to your test.  On the Day of the Test: . Drink plenty of water. Do not drink any water within one hour of the test. . Do not eat any food 4 hours prior to the test. . You may take your regular medications prior to the test.  . HOLD Furosemide/Hydrochlorothiazide morning of the test.  After the Test: . Drink plenty of water. . After receiving IV contrast, you may experience a mild flushed feeling. This is normal. . On occasion, you may experience a mild rash up to 24 hours after the test. This is not dangerous. If this occurs, you can take Benadryl 25 mg and increase your fluid intake. . If you experience trouble breathing, this can be serious. If it is severe call 911 IMMEDIATELY. If it is mild, please call  our office. . If you take any of these medications: Glipizide/Metformin, Avandament, Glucavance, please do not take 48 hours after completing test unless otherwise instructed.   Once we have confirmed authorization from your insurance company, we will call you to set up a date and time for your test. Based on how quickly your insurance processes prior authorizations requests, please allow up to 4 weeks to be contacted for scheduling your Cardiac CT appointment. Be advised that routine Cardiac CT appointments could be scheduled as many as 8 weeks after your provider has ordered it.  For non-scheduling related questions, please contact the cardiac imaging nurse navigator should you have any questions/concerns: Marchia Bond, Cardiac Imaging Nurse Navigator Burley Saver, Interim Cardiac Imaging Nurse [place] [facility] Direct Office Dial: [phone]   For scheduling needs, including cancellations and rescheduling, please call Vivien Rota at [phone], option 3.      Electrophysiology/Ablation Procedure Instructions   You are scheduled for a(n)  ablation on 07/18/2020 with Dr. Lars Mage.   1.   Pre procedure testing-             A.  LAB WORK --- On 06/30/2020  for your pre procedure blood work. You do NOT need to be fasting.  You can stop by the office anytime between 7:30 am - 4:30 pm                B. COVID TEST-- On 07/16/2020 @ 10:00 am -  This is a Drive Up Visit at [address] [place], [place] [address].  Someone will direct you to the appropriate testing line. Stay in your car and someone will be with you shortly.   After you are tested please go home and self quarantine until the day of your procedure.     2. On the day of your procedure 07/18/2020 you will go to [facility] [phone]) at 5:30 am.  Dennis Bast will go to the main entrance A [employer]) and enter where the [employer] are.  Your driver will drop you off and you will head  down the hallway to ADMITTING.  You may have one support person come in to the hospital with you.  They will be asked to wait in the waiting room. It is OK to have someone drop you off and come back when you are ready to be discharged.   3.   Do not eat or drink after midnight prior to your procedure.   4.   On the morning of your procedure do NOT take any medication. Do not miss any doses of your blood thinner prior to the morning of your procedure or your procedure will need to be rescheduled.   5.  Plan for an overnight stay but you may be discharged after your procedure, if you use your phone frequently bring your phone charger. If you are discharged after your procedure you will need someone to drive you home and be with you for 24 hours  after your procedure.   6. You will follow up with the AFIB clinic 4 weeks after your procedure.  You will follow up with Dr. Quentin Ore 3 months after your procedure.  These appointments will be made for you.   * If you have ANY questions please call the office [phone] and ask for [facility] or send me a MyChart message   * Occasionally, EP Studies and ablations can become lengthy.  Please make your family aware of this before your procedure starts.  Average time ranges from 2-8 hours for EP studies/ablations.  Your physician will call your family after the procedure with the results.                                    Cardiac Ablation Cardiac ablation is a procedure to destroy (ablate) some heart tissue that is sending bad signals. These bad signals cause problems in heart rhythm. The heart has many areas that make these signals. If there are problems in these areas, they can make the heart beat in a way that is not normal. Destroying some tissues can help make the heart rhythm normal. Tell your doctor about:  Any allergies you have.  All medicines you are taking. These include vitamins, herbs, eye drops, creams, and over-the-counter  medicines.  Any problems you or family members have had with medicines that make you fall asleep (anesthetics).  Any blood disorders you have.  Any surgeries you have had.  Any medical conditions you have, such as kidney failure.  Whether you are pregnant or may be pregnant. What are the risks? This is a safe procedure. But problems may occur, including:  Infection.  Bruising and bleeding.  Bleeding into the chest.  Stroke or blood clots.  Damage to nearby areas of your body.  Allergies to medicines or dyes.  The need for a pacemaker if the normal system is damaged.  Failure of the procedure to treat the problem. What happens before the procedure? Medicines Ask your doctor about:  Changing or stopping your normal medicines. This is important.  Taking aspirin and ibuprofen. Do not take these medicines unless your doctor tells you to take them.  Taking other medicines, vitamins, herbs, and supplements. General instructions  Follow instructions from your doctor about what you cannot eat or drink.  Plan to have someone take you home from the hospital or clinic.  If you will be going home right after the procedure, plan to have someone with you for 24 hours.  Ask your doctor what steps will be taken to prevent infection. What happens during the procedure?  An IV tube will be put into one of your veins.  You will be given a medicine to help you relax.  The skin on your neck or groin will be numbed.  A cut (incision) will be made in your neck or groin. A needle will be put through your cut and into a large vein.  A tube (catheter) will be put into the needle. The tube will be moved to your heart.  Dye may be put through the tube. This helps your doctor see your heart.  Small devices (electrodes) on the tube will send out signals.  A type of energy will be used to destroy some heart tissue.  The tube will be taken out.  Pressure will be held on your cut.  This helps stop bleeding.  A bandage  will be put over your cut. The exact procedure may vary among doctors and hospitals.   What happens after the procedure?  You will be watched until you leave the hospital or clinic. This includes checking your heart rate, breathing rate, oxygen, and blood pressure.  Your cut will be watched for bleeding. You will need to lie still for a few hours.  Do not drive for 24 hours or as long as your doctor tells you. Summary  Cardiac ablation is a procedure to destroy some heart tissue. This is done to treat heart rhythm problems.  Tell your doctor about any medical conditions you may have. Tell him or her about all medicines you are taking to treat them.  This is a safe procedure. But problems may occur. These include infection, bruising, bleeding, and damage to nearby areas of your body.  Follow what your doctor tells you about food and drink. You may also be told to change or stop some of your medicines.  After the procedure, do not drive for 24 hours or as long as your doctor tells you. This information is not intended to replace advice given to you by your health care provider. Make sure you discuss any questions you have with your health care provider. Document Revised: 02/01/2019 Document Reviewed: 02/01/2019 Elsevier Patient Education  2021 [employer].

## 2020-05-27 NOTE — Progress Notes (Signed)
Electrophysiology Office Note:    Date:  05/27/2020   ID:  Carlos Gutierrez, Carlos Gutierrez Dec 26, 1950, MRN [id number]  PCP:  London Pepper, MD  [facility] Cardiologist:  Sinclair Grooms, MD  [facility] Electrophysiologist:  Cristopher Peru, MD   Referring MD: Evans Lance, MD   Chief Complaint: Paroxysmal atrial fibrillation  History of Present Illness:    Carlos Gutierrez is a 70 y.o. male who presents for an evaluation of paroxysmal atrial fibrillation at the request of Dr. Lovena Le. Their medical history includes hypertension, obstructive sleep apnea on CPAP, hypercholesterolemia, atrial flutter post ablation in 2008.  He was started on dofetilide approximately 12 months ago and has continued to have symptomatic breakthrough atrial fibrillation since then.  He reports 1-2 episodes per month with each episode lasting 1 to 2 days. He thinks they are increasing in frequency and duration. He is a very active man and would like to pursue rhythm control.  He helps take care of his wife who has a brain cancer being treated at [facility].     Past Medical History:  Diagnosis Date  . ACL tear    RIGHT KNEE  . Atrial fibrillation ([place]) 2012   Rare occurrences. On ASA & Flecainide prn  . Atrial flutter ([place])    s/p ablation 2008. On Flecainide prn.  . Broken leg 1968  . Concussion 1966   History of 4  . Diabetes mellitus without complication ([place])   . DVT (deep venous thrombosis) ([facility])    LEFT LOWER LEG  . Dyslipidemia   . Essential hypertension, benign   . Insomnia   . Low back pain   . Obesity (BMI 30-39.9)   . Obstructive sleep apnea on CPAP    Mild  . PAF (paroxysmal atrial fibrillation) ([place])    Rare occurrences. On ASA & Flecainide prn  . Pure hypercholesterolemia    On Lipitor   . Right shoulder pain    Intermittent  . Status post ablation of atrial flutter 2008  . Umbilical hernia     Past Surgical History:  Procedure Laterality Date  . ablation for atrial flutter   01/2002  . CARDIAC CATHETERIZATION  12/26/01  . CARDIAC CATHETERIZATION  04/18/2019  . COLONOSCOPY WITH PROPOFOL N/A 03/12/2016   Procedure: COLONOSCOPY WITH PROPOFOL;  Surgeon: [name], MD;  Location: [facility];  Service: Endoscopy;  Laterality: N/A;  . COLONOSCOPY WITH PROPOFOL N/A 05/05/2019   Procedure: COLONOSCOPY WITH PROPOFOL;  Surgeon: [name], MD;  Location: [place];  Service: Endoscopy;  Laterality: N/A;  . coronary angiography  1997   40% stenosis  . ESOPHAGOGASTRODUODENOSCOPY (EGD) WITH PROPOFOL N/A 10/15/2016   Procedure: ESOPHAGOGASTRODUODENOSCOPY (EGD) WITH PROPOFOL;  Surgeon: [name], MD;  Location: [facility];  Service: Endoscopy;  Laterality: N/A;  . ESOPHAGOGASTRODUODENOSCOPY (EGD) WITH PROPOFOL N/A 12/14/2019   Procedure: ESOPHAGOGASTRODUODENOSCOPY (EGD) WITH PROPOFOL;  Surgeon: [name], MD;  Location: [facility];  Service: Endoscopy;  Laterality: N/A;  . LEFT HEART CATH AND CORONARY ANGIOGRAPHY N/A 02/11/2017   Procedure: LEFT HEART CATH AND CORONARY ANGIOGRAPHY;  Surgeon: Belva Crome, MD;  Location: [place] [facility];  Service: Cardiovascular;  Laterality: N/A;  . LEFT HEART CATH AND CORONARY ANGIOGRAPHY N/A 04/18/2019   Procedure: LEFT HEART CATH AND CORONARY ANGIOGRAPHY;  Surgeon: Belva Crome, MD;  Location: [place] [facility];  Service: Cardiovascular;  Laterality: N/A;  . POLYPECTOMY  05/05/2019   Procedure: POLYPECTOMY;  Surgeon: [name], MD;  Location: [facility]  ENDOSCOPY;  Service: Endoscopy;;  . right knee surgery  1996   ACL, meniscus problems  . UMBILICAL HERNIA REPAIR  04/2015    Current Medications: Current Meds  Medication Sig  . acetaminophen (TYLENOL) 500 MG tablet Take 1,000 mg by mouth as needed for mild pain, moderate pain or headache.   . Ascorbic Acid (VITAMIN C) 1000 MG tablet Take 1,000 mg by mouth daily.  [name] aspirin EC 81 MG tablet Take 1 tablet (81 mg total) by mouth daily.  [name] atorvastatin (LIPITOR) 80 MG  tablet Take 1 tablet (80 mg total) by mouth daily at 6 PM. Please make overdue appt with Dr. Tamala Julian before anymore refills. Thank you 1st attempt  . cetirizine (ZYRTEC) 10 MG tablet Take 10 mg by mouth at bedtime as needed for allergies.  . cholecalciferol (VITAMIN D3) 25 MCG (1000 UNIT) tablet Take 1,000 Units by mouth daily.  . clindamycin (CLEOCIN T) 1 % SWAB Apply 1 application topically 2 (two) times daily.  [name] dofetilide (TIKOSYN) 500 MCG capsule TAKE (1) CAPSULE TWICE DAILY.  [name] glipiZIDE (GLUCOTROL XL) 2.5 MG 24 hr tablet Take 2.5 mg by mouth daily with breakfast.  . loperamide (IMODIUM A-D) 2 MG tablet Take 2 mg by mouth as needed for diarrhea or loose stools.   . melatonin 3 MG TABS tablet Take 3 mg by mouth at bedtime.  . Multiple Vitamins-Minerals (MULTIVITAMIN WITH MINERALS) tablet Take 1 tablet by mouth daily.  . nitroGLYCERIN (NITROSTAT) 0.4 MG SL tablet Place 0.4 mg under the tongue every 5 (five) minutes as needed for chest pain.  [name] olmesartan (BENICAR) 20 MG tablet Take 1 tablet (20 mg total) by mouth daily. Pt must keep upcoming appt in March for further refills  . pantoprazole (PROTONIX) 40 MG tablet TAKE 1 TABLET BY MOUTH TWICE DAILY.  [name] potassium chloride SA (KLOR-CON) 20 MEQ tablet Take 1 tablet (20 mEq total) by mouth daily. Please make overdue appt with Dr. Tamala Julian before anymore refills. Thank you 1st attempt  . XARELTO 20 MG TABS tablet TAKE 1 TABLET ONCE DAILY WITH DINNER.     Allergies:   Metformin and related   Social History   Socioeconomic History  . Marital status: Married    Spouse name: Not on file  . Number of children: 2  . Years of education: Not on file  . Highest education level: Not on file  Occupational History  . Occupation: Scientist, clinical (histocompatibility and immunogenetics): OTHER  Tobacco Use  . Smoking status: Never Smoker  . Smokeless tobacco: Never Used  Vaping Use  . Vaping Use: Never used  Substance and Sexual Activity  . Alcohol use: Yes    Alcohol/week: 9.0  - 12.0 standard drinks    Types: 7 - 10 Glasses of wine, 2 Standard drinks or equivalent per week  . Drug use: No  . Sexual activity: Not on file  Other Topics Concern  . Not on file  Social History Narrative   Caffeine: yes, coffee 2-3 cups daily, tea-rare.    Exercise-yes, 2-3X weekly, jog/run.    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    Marital Status: Married.    Children: 2 children-grown               Social Determinants of Health   Financial Resource Strain: Not on file  Food Insecurity: Not on file  Transportation Needs: Not on file  Physical Activity: Not on file  Stress:  Not on file  Social Connections: Not on file     Family History: The patient's family history includes COPD in his mother; Colon cancer (age of onset: 45) in his maternal grandfather; Diabetes in his mother; Diabetes (age of onset: 48) in his brother; Heart attack in his father and paternal grandfather; Heart disease in his mother; Heart disease (age of onset: 37) in his father; Heart failure in his father; Hypertension in his father; Hypertension (age of onset: 22) in his brother; Hypertension (age of onset: 68) in his mother. There is no history of Stroke.  ROS:   Please see the history of present illness.    All other systems reviewed and are negative.  EKGs/Labs/Other Studies Reviewed:    The following studies were reviewed today:  04/18/2019 LHC  30 to 40% distal left main, which is new/progressed from 2018.  Luminal irregularities in the LAD mid and distal.  First obtuse marginal 50% tandem proximal to mid stenoses.  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  Unchanged compared to prior images.  Normal left ventricular function with EF 55%.  LVEDP normal.  Atrial fibrillation developed during the night.  He is asymptomatic and did not know he was in atrial  fib.  04/18/2019 Echo personally reviewed Normal left ventricular function, 55 percent Right ventricular function normal Normal left and right atrial size No significant valvular abnormalities  December 27, 2019 EKG shows sinus rhythm with a PR interval of 252 ms..  EKG:  The ekg performed May 26, 2020 demonstrates sinus rhythm with a first-degree AV delay.  Recent Labs: 12/27/2019: BUN 18; Creatinine, Ser 1.03; Magnesium 2.1; Potassium 4.3; Sodium 137  Recent Lipid Panel    Component Value Date/Time   CHOL 133 10/05/2017 0906   TRIG 90 10/05/2017 0906   HDL 45 10/05/2017 0906   CHOLHDL 3.0 10/05/2017 0906   LDLCALC 70 10/05/2017 0906    Physical Exam:    VS:  BP 128/84   Pulse 61   Ht 6\' 1"  (1.854 m)   Wt 237 lb (107.5 kg)   SpO2 97%   BMI 31.27 kg/m     Wt Readings from Last 3 Encounters:  05/27/20 237 lb (107.5 kg)  05/26/20 247 lb (112 kg)  12/27/19 232 lb (105.2 kg)     GEN:  Well nourished, well developed in no acute distress HEENT: Normal NECK: No JVD; No carotid bruits LYMPHATICS: No lymphadenopathy CARDIAC: RRR, no murmurs, rubs, gallops RESPIRATORY:  Clear to auscultation without rales, wheezing or rhonchi  ABDOMEN: Soft, non-tender, non-distended MUSCULOSKELETAL:  No edema; No deformity  SKIN: Warm and dry NEUROLOGIC:  Alert and oriented x 3 PSYCHIATRIC:  Normal affect   ASSESSMENT:    1. Paroxysmal atrial fibrillation (HCC)   2. Atrial flutter, unspecified type ([place])   3. Primary hypertension    PLAN:    In order of problems listed above:  1. Symptomatic paroxysmal atrial fibrillation Currently maintained on dofetilide. Post atrial flutter ablation in 2008. Discussed treatment options for rhythm control including antiarrhythmic therapy and ablation.  Discussed the pros and cons of each and he would like to pursue ablation.  Risk, benefits, and alternatives to EP study and radiofrequency ablation for afib were also discussed in detail  today. These risks include but are not limited to stroke, bleeding, vascular damage, tamponade, perforation, damage to the esophagus, lungs, and other structures, pulmonary vein stenosis, worsening renal function, and death. The  patient understands these risk and wishes to proceed.  We will therefore proceed with catheter ablation at the next available time.  Carto, ICE, anesthesia are requested for the procedure.  Will also obtain CT PV protocol prior to the procedure to exclude LAA thrombus and further evaluate atrial anatomy.  2.  Atrial flutter Post ablation in 2008.  No recurrence of atrial flutter.  We will plan to check the CTI line during the PVI.  3.  Hypertension We will plan to continue his current medication regimen.   Medication Adjustments/Labs and Tests Ordered: Current medicines are reviewed at length with the patient today.  Concerns regarding medicines are outlined above.  No orders of the defined types were placed in this encounter.  No orders of the defined types were placed in this encounter.    Signed, Carlos Mage, MD, [facility]  05/27/2020 8:53 AM    Electrophysiology [place]

## 2020-05-27 NOTE — Addendum Note (Signed)
Addended by: Willeen Cass A on: 05/27/2020 08:15 AM   Modules accepted: Orders

## 2020-06-01 ENCOUNTER — Other Ambulatory Visit: Payer: Self-pay | Admitting: Interventional Cardiology

## 2020-06-04 DIAGNOSIS — I4891 Unspecified atrial fibrillation: Secondary | ICD-10-CM | POA: Diagnosis not present

## 2020-06-04 DIAGNOSIS — E1169 Type 2 diabetes mellitus with other specified complication: Secondary | ICD-10-CM | POA: Diagnosis not present

## 2020-06-04 DIAGNOSIS — Z0000 Encounter for general adult medical examination without abnormal findings: Secondary | ICD-10-CM | POA: Diagnosis not present

## 2020-06-04 DIAGNOSIS — Z125 Encounter for screening for malignant neoplasm of prostate: Secondary | ICD-10-CM | POA: Diagnosis not present

## 2020-06-04 DIAGNOSIS — E785 Hyperlipidemia, unspecified: Secondary | ICD-10-CM | POA: Diagnosis not present

## 2020-06-09 ENCOUNTER — Other Ambulatory Visit: Payer: Self-pay

## 2020-06-10 ENCOUNTER — Telehealth: Payer: Self-pay | Admitting: Interventional Cardiology

## 2020-06-10 MED ORDER — ATORVASTATIN CALCIUM 80 MG PO TABS: 80.0000 mg | ORAL_TABLET | Freq: Every day | ORAL | 0 refills | Status: DC

## 2020-06-10 MED ORDER — OLMESARTAN MEDOXOMIL 20 MG PO TABS: 20.0000 mg | ORAL_TABLET | Freq: Every day | ORAL | 0 refills | Status: DC

## 2020-06-10 MED ORDER — POTASSIUM CHLORIDE CRYS ER 20 MEQ PO TBCR: 20.0000 meq | EXTENDED_RELEASE_TABLET | Freq: Every day | ORAL | 0 refills | Status: DC

## 2020-06-10 NOTE — Telephone Encounter (Signed)
Pt's medications were sent to pt's pharmacy as requested. Confirmation received.  

## 2020-06-10 NOTE — Telephone Encounter (Signed)
*  STAT* If patient is at the pharmacy, call can be transferred to refill team.   1. Which medications need to be refilled? (please list name of each medication and dose if known)  atorvastatin (LIPITOR) 80 MG tablet; olmesartan (BENICAR) 20 MG tablet; potassium chloride SA (KLOR-CON) 20 MEQ tablet  2. Which pharmacy/location (including street and city if local pharmacy) is medication to be sent to?  [facility], [address]  3. Do they need a 30 day or 90 day supply? 90 day for all

## 2020-07-02 ENCOUNTER — Other Ambulatory Visit: Payer: Self-pay | Admitting: Interventional Cardiology

## 2020-07-11 ENCOUNTER — Telehealth (HOSPITAL_COMMUNITY): Payer: Self-pay | Admitting: *Deleted

## 2020-07-11 ENCOUNTER — Other Ambulatory Visit: Payer: Self-pay

## 2020-07-11 ENCOUNTER — Other Ambulatory Visit: Payer: BC Managed Care – PPO | Admitting: *Deleted

## 2020-07-11 DIAGNOSIS — I480 Paroxysmal atrial fibrillation: Secondary | ICD-10-CM | POA: Diagnosis not present

## 2020-07-11 DIAGNOSIS — Z01812 Encounter for preprocedural laboratory examination: Secondary | ICD-10-CM | POA: Diagnosis not present

## 2020-07-11 NOTE — Telephone Encounter (Signed)
Reaching out to patient to offer assistance regarding upcoming cardiac imaging study; pt verbalizes understanding of appt date/time, parking situation and where to check in, pre-test NPO status, and verified current allergies; name and call back number provided for further questions should they arise  Gordy Clement RN [place] [facility] [phone] office [phone] cell  Pt states he will get blood work on the afternoon of April 29.

## 2020-07-12 LAB — BASIC METABOLIC PANEL
BUN/Creatinine Ratio: 15 (ref 10–24)
BUN: 17 mg/dL (ref 8–27)
CO2: 20 mmol/L (ref 20–29)
Calcium: 9.7 mg/dL (ref 8.6–10.2)
Chloride: 104 mmol/L (ref 96–106)
Creatinine, Ser: 1.11 mg/dL (ref 0.76–1.27)
Glucose: 134 mg/dL — ABNORMAL HIGH (ref 65–99)
Potassium: 4.6 mmol/L (ref 3.5–5.2)
Sodium: 140 mmol/L (ref 134–144)
eGFR: 72 mL/min/{1.73_m2} (ref >59)

## 2020-07-12 LAB — CBC
Hematocrit: 48.2 % (ref 37.5–51.0)
Hemoglobin: 16.3 g/dL (ref 13.0–17.7)
MCH: 30.1 pg (ref 26.6–33.0)
MCHC: 33.8 g/dL (ref 31.5–35.7)
MCV: 89 fL (ref 79–97)
Platelets: 172 10*3/uL (ref 150–450)
RBC: 5.41 x10E6/uL (ref 4.14–5.80)
RDW: 13.1 % (ref 11.6–15.4)
WBC: 7 10*3/uL (ref 3.4–10.8)

## 2020-07-14 ENCOUNTER — Ambulatory Visit (HOSPITAL_COMMUNITY)
Admission: RE | Admit: 2020-07-14 | Discharge: 2020-07-14 | Disposition: A | Discharge status: Home / Self Care | Payer: BC Managed Care – PPO | Source: Ambulatory Visit | Attending: Cardiology | Admitting: Cardiology

## 2020-07-14 ENCOUNTER — Other Ambulatory Visit: Payer: Self-pay

## 2020-07-14 DIAGNOSIS — I480 Paroxysmal atrial fibrillation: Secondary | ICD-10-CM | POA: Diagnosis not present

## 2020-07-14 MED ORDER — IOHEXOL 350 MG/ML SOLN
80.0000 mL | Freq: Once | INTRAVENOUS | Status: AC | PRN
  Administered 2020-07-14: 80 mL via INTRAVENOUS

## 2020-07-16 ENCOUNTER — Other Ambulatory Visit (HOSPITAL_COMMUNITY)
Admission: RE | Admit: 2020-07-16 | Discharge: 2020-07-16 | Disposition: A | Discharge status: Home / Self Care | Payer: BC Managed Care – PPO | Source: Ambulatory Visit | Attending: Cardiology | Admitting: Cardiology

## 2020-07-16 DIAGNOSIS — I480 Paroxysmal atrial fibrillation: Secondary | ICD-10-CM | POA: Diagnosis not present

## 2020-07-16 DIAGNOSIS — Z7901 Long term (current) use of anticoagulants: Secondary | ICD-10-CM | POA: Diagnosis not present

## 2020-07-16 DIAGNOSIS — I10 Essential (primary) hypertension: Secondary | ICD-10-CM | POA: Diagnosis not present

## 2020-07-16 DIAGNOSIS — Z20822 Contact with and (suspected) exposure to covid-19: Secondary | ICD-10-CM | POA: Insufficient documentation

## 2020-07-16 DIAGNOSIS — Z01812 Encounter for preprocedural laboratory examination: Secondary | ICD-10-CM | POA: Insufficient documentation

## 2020-07-16 DIAGNOSIS — Z79899 Other long term (current) drug therapy: Secondary | ICD-10-CM | POA: Diagnosis not present

## 2020-07-16 DIAGNOSIS — Z7982 Long term (current) use of aspirin: Secondary | ICD-10-CM | POA: Diagnosis not present

## 2020-07-16 DIAGNOSIS — Z7984 Long term (current) use of oral hypoglycemic drugs: Secondary | ICD-10-CM | POA: Diagnosis not present

## 2020-07-16 DIAGNOSIS — I4892 Unspecified atrial flutter: Secondary | ICD-10-CM | POA: Diagnosis not present

## 2020-07-16 LAB — SARS CORONAVIRUS 2 (TAT 6-24 HRS): SARS Coronavirus 2: NEGATIVE

## 2020-07-17 NOTE — Anesthesia Preprocedure Evaluation (Addendum)
Anesthesia Evaluation  Patient identified by MRN, date of birth, ID band Patient awake    Reviewed: Allergy & Precautions, NPO status , Patient's Chart, lab work & pertinent test results  Airway Mallampati: II  TM Distance: >3 FB Neck ROM: Full    Dental  (+) Teeth Intact   Pulmonary sleep apnea ,    Pulmonary exam normal        Cardiovascular hypertension, + CAD   Rhythm:Irregular Rate:Normal     Neuro/Psych negative neurological ROS  negative psych ROS   GI/Hepatic Neg liver ROS, GERD  Medicated,  Endo/Other  diabetes, Type 2, Oral Hypoglycemic Agents  Renal/GU negative Renal ROS  negative genitourinary   Musculoskeletal negative musculoskeletal ROS (+)   Abdominal (+)  Abdomen: soft. Bowel sounds: normal.  Peds  Hematology  (+) anemia ,   Anesthesia Other Findings   Reproductive/Obstetrics                            Anesthesia Physical Anesthesia Plan  ASA: III  Anesthesia Plan: General   Post-op Pain Management:    Induction: Intravenous  PONV Risk Score and Plan: 2 and Ondansetron, Dexamethasone, Midazolam and Treatment may vary due to age or medical condition  Airway Management Planned: Mask and Oral ETT  Additional Equipment: None  Intra-op Plan:   Post-operative Plan: Extubation in OR  Informed Consent: I have reviewed the patients History and Physical, chart, labs and discussed the procedure including the risks, benefits and alternatives for the proposed anesthesia with the patient or authorized representative who has indicated his/her understanding and acceptance.     Dental advisory given  Plan Discussed with: CRNA  Anesthesia Plan Comments: (ECHO 04/18/19: 1. Left ventricular ejection fraction, by visual estimation, is 50 to  55%. The left ventricle has normal function. There is no left ventricular  hypertrophy.  2. Definity contrast agent was given IV  to delineate the left ventricular  endocardial borders.  3. The left ventricle has no regional wall motion abnormalities.  4. Left ventricular diastolic parameters are indeterminate.  5. Global right ventricle has normal systolic function.The right  ventricular size is not well visualized. Right vetricular wall thickness  was not assessed.  6. Left atrial size was normal.  7. Right atrial size was normal.  8. The mitral valve is normal in structure. No evidence of mitral valve  regurgitation. No evidence of mitral stenosis.  9. The tricuspid valve is normal in structure. Tricuspid valve  regurgitation is trivial.  10. The aortic valve is grossly normal. Aortic valve regurgitation is not  visualized. No evidence of aortic valve sclerosis or stenosis.  11. The pulmonic valve was normal in structure. Pulmonic valve  regurgitation is trivial.  12. The inferior vena cava is normal in size with greater than 50%  respiratory variability, suggesting right atrial pressure of 3 mmHg.  13. TR signal is inadequate for assessing pulmonary artery systolic  pressure.  Lab Results      Component                Value               Date                      WBC                      7.0  07/11/2020                HGB                      16.3                07/11/2020                HCT                      48.2                07/11/2020                MCV                      89                  07/11/2020                PLT                      172                 07/11/2020           Lab Results      Component                Value               Date                      NA                       140                 07/11/2020                K                        4.6                 07/11/2020                CO2                      20                  07/11/2020                GLUCOSE                  134 (H)             07/11/2020                BUN                      17                   07/11/2020                CREATININE               1.11                07/11/2020  CALCIUM                  9.7                 07/11/2020                GFRNONAA                 >60                 12/27/2019                GFRAA                    >60                 08/28/2019          )      Anesthesia Quick Evaluation

## 2020-07-18 ENCOUNTER — Encounter (HOSPITAL_COMMUNITY)
Admission: RE | Disposition: A | Discharge status: Home / Self Care | Payer: Self-pay | Source: Home / Self Care | Attending: Cardiology

## 2020-07-18 ENCOUNTER — Ambulatory Visit (HOSPITAL_COMMUNITY)
Admission: RE | Admit: 2020-07-18 | Discharge: 2020-07-18 | Disposition: A | Discharge status: Home / Self Care | Payer: BC Managed Care – PPO | Attending: Cardiology | Admitting: Cardiology

## 2020-07-18 ENCOUNTER — Ambulatory Visit (HOSPITAL_COMMUNITY): Payer: BC Managed Care – PPO | Admitting: Anesthesiology

## 2020-07-18 ENCOUNTER — Encounter (HOSPITAL_COMMUNITY): Payer: Self-pay | Admitting: Cardiology

## 2020-07-18 DIAGNOSIS — Z9989 Dependence on other enabling machines and devices: Secondary | ICD-10-CM | POA: Diagnosis not present

## 2020-07-18 DIAGNOSIS — Z7982 Long term (current) use of aspirin: Secondary | ICD-10-CM | POA: Diagnosis not present

## 2020-07-18 DIAGNOSIS — Z7984 Long term (current) use of oral hypoglycemic drugs: Secondary | ICD-10-CM | POA: Insufficient documentation

## 2020-07-18 DIAGNOSIS — I10 Essential (primary) hypertension: Secondary | ICD-10-CM | POA: Diagnosis not present

## 2020-07-18 DIAGNOSIS — I4892 Unspecified atrial flutter: Secondary | ICD-10-CM | POA: Diagnosis not present

## 2020-07-18 DIAGNOSIS — Z20822 Contact with and (suspected) exposure to covid-19: Secondary | ICD-10-CM | POA: Insufficient documentation

## 2020-07-18 DIAGNOSIS — G4733 Obstructive sleep apnea (adult) (pediatric): Secondary | ICD-10-CM | POA: Diagnosis not present

## 2020-07-18 DIAGNOSIS — Z79899 Other long term (current) drug therapy: Secondary | ICD-10-CM | POA: Diagnosis not present

## 2020-07-18 DIAGNOSIS — I480 Paroxysmal atrial fibrillation: Secondary | ICD-10-CM | POA: Insufficient documentation

## 2020-07-18 DIAGNOSIS — Z7901 Long term (current) use of anticoagulants: Secondary | ICD-10-CM | POA: Insufficient documentation

## 2020-07-18 DIAGNOSIS — D62 Acute posthemorrhagic anemia: Secondary | ICD-10-CM | POA: Diagnosis not present

## 2020-07-18 HISTORY — PX: ATRIAL FIBRILLATION ABLATION: EP1191

## 2020-07-18 LAB — POCT ACTIVATED CLOTTING TIME
Activated Clotting Time: 333 seconds
Activated Clotting Time: 368 seconds

## 2020-07-18 LAB — GLUCOSE, CAPILLARY
Glucose-Capillary: 167 mg/dL — ABNORMAL HIGH (ref 70–99)
Glucose-Capillary: 154 mg/dL — ABNORMAL HIGH (ref 70–99)

## 2020-07-18 SURGERY — ATRIAL FIBRILLATION ABLATION
Anesthesia: General

## 2020-07-18 MED ORDER — SUGAMMADEX SODIUM 200 MG/2ML IV SOLN
INTRAVENOUS | Status: DC | PRN
  Administered 2020-07-18: 200 mg via INTRAVENOUS

## 2020-07-18 MED ORDER — PANTOPRAZOLE SODIUM 40 MG PO TBEC
40.0000 mg | DELAYED_RELEASE_TABLET | Freq: Two times a day (BID) | ORAL | Status: DC
  Administered 2020-07-18: 40 mg via ORAL
  Filled 2020-07-18: qty 1

## 2020-07-18 MED ORDER — LIDOCAINE 2% (20 MG/ML) 5 ML SYRINGE
INTRAMUSCULAR | Status: DC | PRN
  Administered 2020-07-18: 80 mg via INTRAVENOUS

## 2020-07-18 MED ORDER — FENTANYL CITRATE (PF) 250 MCG/5ML IJ SOLN
INTRAMUSCULAR | Status: DC | PRN
  Administered 2020-07-18: 100 ug via INTRAVENOUS

## 2020-07-18 MED ORDER — PROMETHAZINE HCL 25 MG/ML IJ SOLN
6.2500 mg | INTRAMUSCULAR | Status: DC | PRN
Start: 2020-07-18 — End: 2020-07-18
  Filled 2020-07-18: qty 1

## 2020-07-18 MED ORDER — ONDANSETRON HCL 4 MG/2ML IJ SOLN
4.0000 mg | Freq: Four times a day (QID) | INTRAMUSCULAR | Status: DC | PRN
Start: 2020-07-18 — End: 2020-07-18

## 2020-07-18 MED ORDER — DEXAMETHASONE SODIUM PHOSPHATE 10 MG/ML IJ SOLN
INTRAMUSCULAR | Status: DC | PRN
  Administered 2020-07-18: 10 mg via INTRAVENOUS

## 2020-07-18 MED ORDER — ROCURONIUM BROMIDE 10 MG/ML (PF) SYRINGE
PREFILLED_SYRINGE | INTRAVENOUS | Status: DC | PRN
  Administered 2020-07-18: 70 mg via INTRAVENOUS
  Administered 2020-07-18: 25 mg via INTRAVENOUS

## 2020-07-18 MED ORDER — ISOPROTERENOL HCL 0.2 MG/ML IJ SOLN
INTRAVENOUS | Status: DC | PRN
  Administered 2020-07-18: 2 ug/min via INTRAVENOUS

## 2020-07-18 MED ORDER — HEPARIN SODIUM (PORCINE) 1000 UNIT/ML IJ SOLN
INTRAMUSCULAR | Status: AC
  Filled 2020-07-18: qty 1

## 2020-07-18 MED ORDER — SODIUM CHLORIDE 0.9% FLUSH: 3.0000 mL | Freq: Two times a day (BID) | INTRAVENOUS | Status: DC

## 2020-07-18 MED ORDER — HEPARIN (PORCINE) IN NACL 1000-0.9 UT/500ML-% IV SOLN
INTRAVENOUS | Status: DC | PRN
  Administered 2020-07-18: 500 mL

## 2020-07-18 MED ORDER — HEPARIN SODIUM (PORCINE) 1000 UNIT/ML IJ SOLN
INTRAMUSCULAR | Status: DC | PRN
  Administered 2020-07-18: 15000 [IU] via INTRAVENOUS
  Administered 2020-07-18: 2000 [IU] via INTRAVENOUS

## 2020-07-18 MED ORDER — HEPARIN SODIUM (PORCINE) 1000 UNIT/ML IJ SOLN
INTRAMUSCULAR | Status: DC | PRN
  Administered 2020-07-18: 1000 [IU] via INTRAVENOUS

## 2020-07-18 MED ORDER — ISOPROTERENOL HCL 0.2 MG/ML IJ SOLN
INTRAMUSCULAR | Status: AC
  Filled 2020-07-18: qty 5

## 2020-07-18 MED ORDER — HEPARIN (PORCINE) IN NACL 1000-0.9 UT/500ML-% IV SOLN
INTRAVENOUS | Status: AC
  Filled 2020-07-18: qty 500

## 2020-07-18 MED ORDER — SODIUM CHLORIDE 0.9% FLUSH: 3.0000 mL | INTRAVENOUS | Status: DC | PRN

## 2020-07-18 MED ORDER — ACETAMINOPHEN 10 MG/ML IV SOLN
1000.0000 mg | Freq: Once | INTRAVENOUS | Status: DC | PRN
  Filled 2020-07-18: qty 100

## 2020-07-18 MED ORDER — ACETAMINOPHEN 325 MG PO TABS: 650.0000 mg | ORAL_TABLET | ORAL | Status: DC | PRN

## 2020-07-18 MED ORDER — ONDANSETRON HCL 4 MG/2ML IJ SOLN
INTRAMUSCULAR | Status: DC | PRN
  Administered 2020-07-18: 4 mg via INTRAVENOUS

## 2020-07-18 MED ORDER — SODIUM CHLORIDE 0.9 % IV SOLN: INTRAVENOUS | Status: DC

## 2020-07-18 MED ORDER — PROTAMINE SULFATE 10 MG/ML IV SOLN
INTRAVENOUS | Status: DC | PRN
  Administered 2020-07-18: 30 mg via INTRAVENOUS

## 2020-07-18 MED ORDER — FENTANYL CITRATE (PF) 100 MCG/2ML IJ SOLN: 25.0000 ug | INTRAMUSCULAR | Status: DC | PRN

## 2020-07-18 MED ORDER — PHENYLEPHRINE HCL-NACL 10-0.9 MG/250ML-% IV SOLN
INTRAVENOUS | Status: DC | PRN
  Administered 2020-07-18: 15 ug/min via INTRAVENOUS

## 2020-07-18 MED ORDER — PROPOFOL 10 MG/ML IV BOLUS
INTRAVENOUS | Status: DC | PRN
  Administered 2020-07-18: 170 mg via INTRAVENOUS
  Administered 2020-07-18: 30 mg via INTRAVENOUS

## 2020-07-18 MED ORDER — SODIUM CHLORIDE 0.9 % IV SOLN: 250.0000 mL | INTRAVENOUS | Status: DC | PRN

## 2020-07-18 SURGICAL SUPPLY — 19 items
BLANKET WARM UNDERBOD FULL ACC (MISCELLANEOUS) ×2 IMPLANT
CATH MAPPNG PENTARAY F 2-6-2MM (CATHETERS) IMPLANT
CATH S CIRCA THERM PROBE 10F (CATHETERS) ×1 IMPLANT
CATH SMTCH THERMOCOOL SF DF (CATHETERS) ×1 IMPLANT
CATH SOUNDSTAR ECO 8FR (CATHETERS) ×1 IMPLANT
CATH WEBSTER BI DIR CS D-F CRV (CATHETERS) ×1 IMPLANT
CLOSURE PERCLOSE PROSTYLE (VASCULAR PRODUCTS) ×3 IMPLANT
COVER SWIFTLINK CONNECTOR (BAG) ×2 IMPLANT
MAT PREVALON FULL STRYKER (MISCELLANEOUS) ×2 IMPLANT
PACK EP LATEX FREE (CUSTOM PROCEDURE TRAY) ×2
PACK EP LF (CUSTOM PROCEDURE TRAY) ×1 IMPLANT
PAD PRO RADIOLUCENT 2001M-C (PAD) ×2 IMPLANT
PATCH CARTO3 (PAD) ×1 IMPLANT
PENTARAY F 2-6-2MM (CATHETERS) ×2
SHEATH BAYLIS TRANSSEPTAL 98CM (NEEDLE) ×1 IMPLANT
SHEATH CARTO VIZIGO SM CVD (SHEATH) ×1 IMPLANT
SHEATH PINNACLE 8F 10CM (SHEATH) ×4 IMPLANT
SHEATH PINNACLE 9F 10CM (SHEATH) ×1 IMPLANT
TUBING SMART ABLATE COOLFLOW (TUBING) ×2 IMPLANT

## 2020-07-18 NOTE — Transfer of Care (Signed)
Immediate Anesthesia Transfer of Care Note  Patient: Carlos Gutierrez  Procedure(s) Performed: ATRIAL FIBRILLATION ABLATION (N/A )  Patient Location: Cath Lab  Anesthesia Type:General  Level of Consciousness: drowsy and patient cooperative  Airway & Oxygen Therapy: Patient Spontanous Breathing  Post-op Assessment: Report given to RN, Post -op Vital signs reviewed and stable and Patient moving all extremities X 4  Post vital signs: Reviewed and stable  Last Vitals:  Vitals Value Taken Time  BP    Temp    Pulse    Resp    SpO2      Last Pain:  Vitals:   07/18/20 0608  TempSrc:   PainSc: 0-No pain      Patients Stated Pain Goal: 2 (02/77/41 2878)  Complications: No complications documented.

## 2020-07-18 NOTE — H&P (Signed)
Electrophysiology Office Note:    Date:  05/27/2020   ID:  Carlos, Gutierrez January 03, 1951, MRN [id number]  PCP:  London Pepper, MD   [facility] Cardiologist:  Sinclair Grooms, MD  [facility] Electrophysiologist:  Cristopher Peru, MD   Referring MD: Evans Lance, MD   Chief Complaint: Paroxysmal atrial fibrillation  History of Present Illness:    Carlos Gutierrez is a 70 y.o. male who presents for an evaluation of paroxysmal atrial fibrillation at the request of Dr. Lovena Le. Their medical history includes hypertension, obstructive sleep apnea on CPAP, hypercholesterolemia, atrial flutter post ablation in 2008.  He was started on dofetilide approximately 12 months ago and has continued to have symptomatic breakthrough atrial fibrillation since then.  He reports 1-2 episodes per month with each episode lasting 1 to 2 days. He thinks they are increasing in frequency and duration. He is a very active man and would like to pursue rhythm control.  He helps take care of his wife who has a brain cancer being treated at [facility].         Past Medical History:  Diagnosis Date  . ACL tear    RIGHT KNEE  . Atrial fibrillation ([place]) 2012   Rare occurrences. On ASA & Flecainide prn  . Atrial flutter ([place])    s/p ablation 2008. On Flecainide prn.  . Broken leg 1968  . Concussion 1966   History of 4  . Diabetes mellitus without complication ([place])   . DVT (deep venous thrombosis) ([facility])    LEFT LOWER LEG  . Dyslipidemia   . Essential hypertension, benign   . Insomnia   . Low back pain   . Obesity (BMI 30-39.9)   . Obstructive sleep apnea on CPAP    Mild  . PAF (paroxysmal atrial fibrillation) ([place])    Rare occurrences. On ASA & Flecainide prn  . Pure hypercholesterolemia    On Lipitor   . Right shoulder pain    Intermittent  . Status post ablation of atrial flutter 2008  . Umbilical hernia          Past Surgical History:  Procedure  Laterality Date  . ablation for atrial flutter  01/2002  . CARDIAC CATHETERIZATION  12/26/01  . CARDIAC CATHETERIZATION  04/18/2019  . COLONOSCOPY WITH PROPOFOL N/A 03/12/2016   Procedure: COLONOSCOPY WITH PROPOFOL;  Surgeon: [name], MD;  Location: [facility];  Service: Endoscopy;  Laterality: N/A;  . COLONOSCOPY WITH PROPOFOL N/A 05/05/2019   Procedure: COLONOSCOPY WITH PROPOFOL;  Surgeon: [name], MD;  Location: [place];  Service: Endoscopy;  Laterality: N/A;  . coronary angiography  1997   40% stenosis  . ESOPHAGOGASTRODUODENOSCOPY (EGD) WITH PROPOFOL N/A 10/15/2016   Procedure: ESOPHAGOGASTRODUODENOSCOPY (EGD) WITH PROPOFOL;  Surgeon: [name], MD;  Location: [facility];  Service: Endoscopy;  Laterality: N/A;  . ESOPHAGOGASTRODUODENOSCOPY (EGD) WITH PROPOFOL N/A 12/14/2019   Procedure: ESOPHAGOGASTRODUODENOSCOPY (EGD) WITH PROPOFOL;  Surgeon: [name], MD;  Location: [facility];  Service: Endoscopy;  Laterality: N/A;  . LEFT HEART CATH AND CORONARY ANGIOGRAPHY N/A 02/11/2017   Procedure: LEFT HEART CATH AND CORONARY ANGIOGRAPHY;  Surgeon: Belva Crome, MD;  Location: [place] [facility];  Service: Cardiovascular;  Laterality: N/A;  . LEFT HEART CATH AND CORONARY ANGIOGRAPHY N/A 04/18/2019   Procedure: LEFT HEART CATH AND CORONARY ANGIOGRAPHY;  Surgeon: Belva Crome, MD;  Location: [place] [facility];  Service: Cardiovascular;  Laterality: N/A;  . POLYPECTOMY  05/05/2019  Procedure: POLYPECTOMY;  Surgeon: [name], MD;  Location: [facility] END[facility];  Service: Endoscopy;;  . right knee surgery  1996   ACL, meniscus problems  . UMBILICAL HERNIA REPAIR  04/2015    Current Medications: Active Medications      Current Meds  Medication Sig  . acetaminophen (TYLENOL) 500 MG tablet Take 1,000 mg by mouth as needed for mild pain, moderate pain or headache.   . Ascorbic Acid (VITAMIN C) 1000 MG tablet Take 1,000 mg by mouth daily.  [name] aspirin EC  81 MG tablet Take 1 tablet (81 mg total) by mouth daily.  [name] atorvastatin (LIPITOR) 80 MG tablet Take 1 tablet (80 mg total) by mouth daily at 6 PM. Please make overdue appt with Dr. Tamala Julian before anymore refills. Thank you 1st attempt  . cetirizine (ZYRTEC) 10 MG tablet Take 10 mg by mouth at bedtime as needed for allergies.  . cholecalciferol (VITAMIN D3) 25 MCG (1000 UNIT) tablet Take 1,000 Units by mouth daily.  . clindamycin (CLEOCIN T) 1 % SWAB Apply 1 application topically 2 (two) times daily.  [name] dofetilide (TIKOSYN) 500 MCG capsule TAKE (1) CAPSULE TWICE DAILY.  [name] glipiZIDE (GLUCOTROL XL) 2.5 MG 24 hr tablet Take 2.5 mg by mouth daily with breakfast.  . loperamide (IMODIUM A-D) 2 MG tablet Take 2 mg by mouth as needed for diarrhea or loose stools.   . melatonin 3 MG TABS tablet Take 3 mg by mouth at bedtime.  . Multiple Vitamins-Minerals (MULTIVITAMIN WITH MINERALS) tablet Take 1 tablet by mouth daily.  . nitroGLYCERIN (NITROSTAT) 0.4 MG SL tablet Place 0.4 mg under the tongue every 5 (five) minutes as needed for chest pain.  [name] olmesartan (BENICAR) 20 MG tablet Take 1 tablet (20 mg total) by mouth daily. Pt must keep upcoming appt in March for further refills  . pantoprazole (PROTONIX) 40 MG tablet TAKE 1 TABLET BY MOUTH TWICE DAILY.  [name] potassium chloride SA (KLOR-CON) 20 MEQ tablet Take 1 tablet (20 mEq total) by mouth daily. Please make overdue appt with Dr. Tamala Julian before anymore refills. Thank you 1st attempt  . XARELTO 20 MG TABS tablet TAKE 1 TABLET ONCE DAILY WITH DINNER.       Allergies:   Metformin and related   Social History        Socioeconomic History  . Marital status: Married    Spouse name: Not on file  . Number of children: 2  . Years of education: Not on file  . Highest education level: Not on file  Occupational History  . Occupation: Scientist, clinical (histocompatibility and immunogenetics): OTHER  Tobacco Use  . Smoking status: Never Smoker  . Smokeless tobacco: Never Used  Vaping  Use  . Vaping Use: Never used  Substance and Sexual Activity  . Alcohol use: Yes    Alcohol/week: 9.0 - 12.0 standard drinks    Types: 7 - 10 Glasses of wine, 2 Standard drinks or equivalent per week  . Drug use: No  . Sexual activity: Not on file  Other Topics Concern  . Not on file  Social History Narrative   Caffeine: yes, coffee 2-3 cups daily, tea-rare.    Exercise-yes, 2-3X weekly, jog/run.    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    Marital Status: Married.    Children: 2 children-grown               Social Determinants of Radio broadcast assistant  Strain: Not on file  Food Insecurity: Not on file  Transportation Needs: Not on file  Physical Activity: Not on file  Stress: Not on file  Social Connections: Not on file     Family History: The patient's family history includes COPD in his mother; Colon cancer (age of onset: 31) in his maternal grandfather; Diabetes in his mother; Diabetes (age of onset: 67) in his brother; Heart attack in his father and paternal grandfather; Heart disease in his mother; Heart disease (age of onset: 94) in his father; Heart failure in his father; Hypertension in his father; Hypertension (age of onset: 55) in his brother; Hypertension (age of onset: 85) in his mother. There is no history of Stroke.  ROS:   Please see the history of present illness.    All other systems reviewed and are negative.  EKGs/Labs/Other Studies Reviewed:    The following studies were reviewed today:  04/18/2019 LHC  30 to 40% distal left main, which is new/progressed from 2018.  Luminal irregularities in the LAD mid and distal.  First obtuse marginal 50% tandem proximal to mid stenoses.  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 Unchanged compared to prior images.  Normal left ventricular function with EF  55%. LVEDP normal.  Atrial fibrillation developed during the night. He is asymptomatic and did not know he was in atrial fib.  04/18/2019 Echo personally reviewed Normal left ventricular function, 55 percent Right ventricular function normal Normal left and right atrial size No significant valvular abnormalities  December 27, 2019 EKG shows sinus rhythm with a PR interval of 252 ms..  EKG:  The ekg performed May 26, 2020 demonstrates sinus rhythm with a first-degree AV delay.  Recent Labs: 12/27/2019: BUN 18; Creatinine, Ser 1.03; Magnesium 2.1; Potassium 4.3; Sodium 137  Recent Lipid Panel Labs (Brief)          Component Value Date/Time   CHOL 133 10/05/2017 0906   TRIG 90 10/05/2017 0906   HDL 45 10/05/2017 0906   CHOLHDL 3.0 10/05/2017 0906   LDLCALC 70 10/05/2017 0906      Physical Exam:    VS:  BP 128/84   Pulse 61   Ht 6\' 1"  (1.854 m)   Wt 237 lb (107.5 kg)   SpO2 97%   BMI 31.27 kg/m        Wt Readings from Last 3 Encounters:  05/27/20 237 lb (107.5 kg)  05/26/20 247 lb (112 kg)  12/27/19 232 lb (105.2 kg)     GEN:  Well nourished, well developed in no acute distress HEENT: Normal NECK: No JVD; No carotid bruits LYMPHATICS: No lymphadenopathy CARDIAC: RRR, no murmurs, rubs, gallops RESPIRATORY:  Clear to auscultation without rales, wheezing or rhonchi  ABDOMEN: Soft, non-tender, non-distended MUSCULOSKELETAL:  No edema; No deformity  SKIN: Warm and dry NEUROLOGIC:  Alert and oriented x 3 PSYCHIATRIC:  Normal affect   ASSESSMENT:    1. Paroxysmal atrial fibrillation (H[facility])   2. Atrial flutter, unspecified type ([place])   3. Primary hypertension    PLAN:    In order of problems listed above:  1. Symptomatic paroxysmal atrial fibrillation Currently maintained on dofetilide. Post atrial flutter ablation in 2008. Discussed treatment options for rhythm control including antiarrhythmic therapy and ablation.  Discussed the pros and  cons of each and he would like to pursue ablation.  Risk, benefits, and alternatives to EP study and radiofrequency ablation for afib were also discussed  in detail today. These risks include but are not limited to stroke, bleeding, vascular damage, tamponade, perforation, damage to the esophagus, lungs, and other structures, pulmonary vein stenosis, worsening renal function, and death. The patient understands these risk and wishes to proceed.  We will therefore proceed with catheter ablation at the next available time.  Carto, ICE, anesthesia are requested for the procedure.  Will also obtain CT PV protocol prior to the procedure to exclude LAA thrombus and further evaluate atrial anatomy.  2.  Atrial flutter Post ablation in 2008.  No recurrence of atrial flutter.  We will plan to check the CTI line during the PVI.  3.  Hypertension We will plan to continue his current medication regimen.     -----------------------------------------------------------------------------------------  I have seen, examined the patient, and reviewed the above assessment and plan.    Plan for PVI today.    [name], MD 07/18/2020 7:11 AM

## 2020-07-18 NOTE — Discharge Instructions (Signed)
Femoral Site Care  This sheet gives you information about how to care for yourself after your procedure. Your health care provider may also give you more specific instructions. If you have problems or questions, contact your health care provider. What can I expect after the procedure? After the procedure, it is common to have:  Bruising that usually fades within 1-2 weeks.  Tenderness at the site. Follow these instructions at home: Wound care  Follow instructions from your health care provider about how to take care of your insertion site. Make sure you: ? Wash your hands with soap and water before you change your bandage (dressing). If soap and water are not available, use hand sanitizer. ? Change your dressing as told by your health care provider. ? Leave stitches (sutures), skin glue, or adhesive strips in place. These skin closures may need to stay in place for 2 weeks or longer. If adhesive strip edges start to loosen and curl up, you may trim the loose edges. Do not remove adhesive strips completely unless your health care provider tells you to do that.  Do not take baths, swim, or use a hot tub until your health care provider approves.  You may shower 24-48 hours after the procedure or as told by your health care provider. ? Gently wash the site with plain soap and water. ? Pat the area dry with a clean towel. ? Do not rub the site. This may cause bleeding.  Do not apply powder or lotion to the site. Keep the site clean and dry.  Check your femoral site every day for signs of infection. Check for: ? Redness, swelling, or pain. ? Fluid or blood. ? Warmth. ? Pus or a bad smell. Activity  For the first 2-3 days after your procedure, or as long as directed: ? Avoid climbing stairs as much as possible. ? Do not squat.  Do not lift anything that is heavier than 10 lb (4.5 kg), or the limit that you are told, until your health care provider says that it is safe.  Rest as  directed. ? Avoid sitting for a long time without moving. Get up to take short walks every 1-2 hours.  Do not drive for 24 hours if you were given a medicine to help you relax (sedative). General instructions  Take over-the-counter and prescription medicines only as told by your health care provider.  Keep all follow-up visits as told by your health care provider. This is important. Contact a health care provider if you have:  A fever or chills.  You have redness, swelling, or pain around your insertion site. Get help right away if:  The catheter insertion area swells very fast.  You pass out.  You suddenly start to sweat or your skin gets clammy.  The catheter insertion area is bleeding, and the bleeding does not stop when you hold steady pressure on the area.  The area near or just beyond the catheter insertion site becomes pale, cool, tingly, or numb. These symptoms may represent a serious problem that is an emergency. Do not wait to see if the symptoms will go away. Get medical help right away. Call your local emergency services (911 in the [place] Do not drive yourself to the hospital. Summary  After the procedure, it is common to have bruising that usually fades within 1-2 weeks.  Check your femoral site every day for signs of infection.  Do not lift anything that is heavier than 10 lb (4.5 kg), or   the limit that you are told, until your health care provider says that it is safe. This information is not intended to replace advice given to you by your health care provider. Make sure you discuss any questions you have with your health care provider. Document Revised: 11/02/2019 Document Reviewed: 11/02/2019 Elsevier Patient Education  2021 [employer] Inc[employer]  

## 2020-07-18 NOTE — Anesthesia Procedure Notes (Signed)
Procedure Name: Intubation Performed by: Darletta Moll, CRNA Pre-anesthesia Checklist: Patient identified, Emergency Drugs available, Suction available and Patient being monitored Patient Re-evaluated:Patient Re-evaluated prior to induction Oxygen Delivery Method: Circle system utilized Preoxygenation: Pre-oxygenation with 100% oxygen Induction Type: IV induction Ventilation: Mask ventilation without difficulty and Two handed mask ventilation required Laryngoscope Size: Mac and 4 Grade View: Grade I Tube type: Oral Tube size: 7.5 mm Number of attempts: 1 Airway Equipment and Method: Stylet Placement Confirmation: ETT inserted through vocal cords under direct vision,  positive ETCO2,  breath sounds checked- equal and bilateral and CO2 detector Secured at: 23 cm Tube secured with: Tape Dental Injury: Teeth and Oropharynx as per pre-operative assessment

## 2020-07-21 DIAGNOSIS — Z8719 Personal history of other diseases of the digestive system: Secondary | ICD-10-CM | POA: Diagnosis not present

## 2020-07-21 DIAGNOSIS — Z9889 Other specified postprocedural states: Secondary | ICD-10-CM | POA: Diagnosis not present

## 2020-07-21 DIAGNOSIS — M6208 Separation of muscle (nontraumatic), other site: Secondary | ICD-10-CM | POA: Diagnosis not present

## 2020-07-28 NOTE — Anesthesia Postprocedure Evaluation (Signed)
Anesthesia Post Note  Patient: Carlos Gutierrez  Procedure(s) Performed: ATRIAL FIBRILLATION ABLATION (N/A )     Patient location during evaluation: PACU Anesthesia Type: General Level of consciousness: awake and alert Pain management: pain level controlled Vital Signs Assessment: post-procedure vital signs reviewed and stable Respiratory status: spontaneous breathing, nonlabored ventilation, respiratory function stable and patient connected to nasal cannula oxygen Cardiovascular status: blood pressure returned to baseline and stable Postop Assessment: no apparent nausea or vomiting Anesthetic complications: no   No complications documented.  Last Vitals:  Vitals:   07/18/20 1300 07/18/20 1315  BP: 128/76   Pulse: 65 63  Resp: 17 18  Temp:    SpO2: 97% 98%    Last Pain:  Vitals:   07/21/20 1322  TempSrc:   PainSc: 0-No pain                 Belenda Cruise P Eddie Koc

## 2020-08-15 ENCOUNTER — Other Ambulatory Visit: Payer: Self-pay

## 2020-08-15 ENCOUNTER — Ambulatory Visit (HOSPITAL_COMMUNITY)
Admission: RE | Admit: 2020-08-15 | Discharge: 2020-08-15 | Disposition: A | Discharge status: Home / Self Care | Payer: BC Managed Care – PPO | Source: Ambulatory Visit | Attending: Physician Assistant | Admitting: Physician Assistant

## 2020-08-15 VITALS — BP 146/86 | HR 55 | Ht 73.0 in | Wt 235.4 lb

## 2020-08-15 DIAGNOSIS — G4733 Obstructive sleep apnea (adult) (pediatric): Secondary | ICD-10-CM | POA: Insufficient documentation

## 2020-08-15 DIAGNOSIS — E785 Hyperlipidemia, unspecified: Secondary | ICD-10-CM | POA: Insufficient documentation

## 2020-08-15 DIAGNOSIS — E119 Type 2 diabetes mellitus without complications: Secondary | ICD-10-CM | POA: Insufficient documentation

## 2020-08-15 DIAGNOSIS — I2510 Atherosclerotic heart disease of native coronary artery without angina pectoris: Secondary | ICD-10-CM | POA: Insufficient documentation

## 2020-08-15 DIAGNOSIS — I10 Essential (primary) hypertension: Secondary | ICD-10-CM | POA: Insufficient documentation

## 2020-08-15 DIAGNOSIS — Z833 Family history of diabetes mellitus: Secondary | ICD-10-CM | POA: Diagnosis not present

## 2020-08-15 DIAGNOSIS — Z79899 Other long term (current) drug therapy: Secondary | ICD-10-CM | POA: Insufficient documentation

## 2020-08-15 DIAGNOSIS — I480 Paroxysmal atrial fibrillation: Secondary | ICD-10-CM | POA: Diagnosis not present

## 2020-08-15 DIAGNOSIS — Z8249 Family history of ischemic heart disease and other diseases of the circulatory system: Secondary | ICD-10-CM | POA: Diagnosis not present

## 2020-08-15 DIAGNOSIS — D6869 Other thrombophilia: Secondary | ICD-10-CM

## 2020-08-15 DIAGNOSIS — Z7901 Long term (current) use of anticoagulants: Secondary | ICD-10-CM | POA: Diagnosis not present

## 2020-08-15 DIAGNOSIS — Z7984 Long term (current) use of oral hypoglycemic drugs: Secondary | ICD-10-CM | POA: Insufficient documentation

## 2020-08-15 DIAGNOSIS — Z86718 Personal history of other venous thrombosis and embolism: Secondary | ICD-10-CM | POA: Insufficient documentation

## 2020-08-15 LAB — BASIC METABOLIC PANEL
Anion gap: 3 — ABNORMAL LOW (ref 5–15)
BUN: 15 mg/dL (ref 8–23)
CO2: 27 mmol/L (ref 22–32)
Calcium: 9.6 mg/dL (ref 8.9–10.3)
Chloride: 108 mmol/L (ref 98–111)
Creatinine, Ser: 1.07 mg/dL (ref 0.61–1.24)
GFR, Estimated: >60 mL/min (ref >60)
Glucose, Bld: 161 mg/dL — ABNORMAL HIGH (ref 70–99)
Potassium: 4.7 mmol/L (ref 3.5–5.1)
Sodium: 138 mmol/L (ref 135–145)

## 2020-08-15 LAB — MAGNESIUM: Magnesium: 2 mg/dL (ref 1.7–2.4)

## 2020-08-15 NOTE — Progress Notes (Signed)
Primary Care Physician: Carlos Pepper, MD Primary Cardiologist: Dr Tamala Julian Primary Electrophysiologist: Dr Lovena Le Referring Physician: Dr Theresa Duty is a 70 y.o. male with a history of CAD (non-obstructive), HTN, HLD, DM, AFlutter (ablated 2003), AFib, DVT, OSA w/CPAP who presents for follow up in the [facility]. Patient is on Xarelto for a CHADS2VASC score of 4. Patient admitted for cardiac catheterization on 04/18/19 for progressive SOB. LHC showed diffuse coronary plaquing with a 70% narrowing in the RCA which is unchanged from 2018. Given patient's CAD, flecainide was stopped and patient was loaded on dofetilide. Patient was hospitalized 04/2019 with a diverticular bleed. Anticoagulation was temporarily held. He has not had any further bleeding issues.   On follow up today, patient continued to have symptomatic episodes of afib more frequently and underwent afib ablation with Dr Quentin Ore on 07/18/20. He reports that he has done well since the procedure with no interim episodes of afib. He denies CP, swallowing pain, or groin issues.   Today, he denies symptoms of palpitations, chest pain, shortness of breath, orthopnea, PND, lower extremity edema, dizziness, presyncope, syncope, bleeding, or neurologic sequela. The patient is tolerating medications without difficulties and is otherwise without complaint today.    Atrial Fibrillation Risk Factors:  he does have symptoms or diagnosis of sleep apnea. he is compliant with CPAP therapy. he does not have a history of rheumatic fever.   he has a BMI of Body mass index is 31.06 kg/m.[name] Filed Weights   08/15/20 0834  Weight: 106.8 kg    Family History  Problem Relation Age of Onset  . Hypertension Mother 43  . Diabetes Mother   . COPD Mother   . Heart disease Mother   . Heart disease Father 16  . Hypertension Father   . Heart failure Father   . Heart attack Father   . Diabetes Brother 55  .  Hypertension Brother 41  . Heart attack Paternal Grandfather   . Colon cancer Maternal Grandfather 31  . Stroke Neg Hx      Atrial Fibrillation Management history:  Previous antiarrhythmic drugs: flecainide, dofetilide Previous cardioversions: none Previous ablations: 2003 flutter, 07/18/20 CHADS2VASC score: 4 Anticoagulation history: Xarelto   Past Medical History:  Diagnosis Date  . ACL tear    RIGHT KNEE  . Atrial fibrillation ([place]) 2012   Rare occurrences. On ASA & Flecainide prn  . Atrial flutter ([place])    s/p ablation 2008. On Flecainide prn.  . Broken leg 1968  . Concussion 1966   History of 4  . Diabetes mellitus without complication ([place])   . DVT (deep venous thrombosis) ([facility])    LEFT LOWER LEG  . Dyslipidemia   . Essential hypertension, benign   . Insomnia   . Low back pain   . Obesity (BMI 30-39.9)   . Obstructive sleep apnea on CPAP    Mild  . PAF (paroxysmal atrial fibrillation) ([place])    Rare occurrences. On ASA & Flecainide prn  . Pure hypercholesterolemia    On Lipitor   . Right shoulder pain    Intermittent  . Status post ablation of atrial flutter 2008  . Umbilical hernia    Past Surgical History:  Procedure Laterality Date  . ablation for atrial flutter  01/2002  . ATRIAL FIBRILLATION ABLATION N/A 07/18/2020   Procedure: ATRIAL FIBRILLATION ABLATION;  Surgeon: [name], MD;  Location: [facility];  Service: Cardiovascular;  Laterality: N/A;  .  CARDIAC CATHETERIZATION  12/26/01  . CARDIAC CATHETERIZATION  04/18/2019  . COLONOSCOPY WITH PROPOFOL N/A 03/12/2016   Procedure: COLONOSCOPY WITH PROPOFOL;  Surgeon: [name], MD;  Location: [facility];  Service: Endoscopy;  Laterality: N/A;  . COLONOSCOPY WITH PROPOFOL N/A 05/05/2019   Procedure: COLONOSCOPY WITH PROPOFOL;  Surgeon: [name], MD;  Location: [facility];  Service: Endoscopy;  Laterality: N/A;  . coronary angiography  1997   40% stenosis  .  ESOPHAGOGASTRODUODENOSCOPY (EGD) WITH PROPOFOL N/A 10/15/2016   Procedure: ESOPHAGOGASTRODUODENOSCOPY (EGD) WITH PROPOFOL;  Surgeon: [name], MD;  Location: [facility];  Service: Endoscopy;  Laterality: N/A;  . ESOPHAGOGASTRODUODENOSCOPY (EGD) WITH PROPOFOL N/A 12/14/2019   Procedure: ESOPHAGOGASTRODUODENOSCOPY (EGD) WITH PROPOFOL;  Surgeon: [name], MD;  Location: [facility];  Service: Endoscopy;  Laterality: N/A;  . LEFT HEART CATH AND CORONARY ANGIOGRAPHY N/A 02/11/2017   Procedure: LEFT HEART CATH AND CORONARY ANGIOGRAPHY;  Surgeon: Belva Crome, MD;  Location: [facility];  Service: Cardiovascular;  Laterality: N/A;  . LEFT HEART CATH AND CORONARY ANGIOGRAPHY N/A 04/18/2019   Procedure: LEFT HEART CATH AND CORONARY ANGIOGRAPHY;  Surgeon: Belva Crome, MD;  Location: [facility];  Service: Cardiovascular;  Laterality: N/A;  . POLYPECTOMY  05/05/2019   Procedure: POLYPECTOMY;  Surgeon: [name], MD;  Location: [facility];  Service: Endoscopy;;  . right knee surgery  1996   ACL, meniscus problems  . UMBILICAL HERNIA REPAIR  04/2015    Current Outpatient Medications  Medication Sig Dispense Refill  . acetaminophen (TYLENOL) 500 MG tablet Take 1,000 mg by mouth as needed for mild pain, moderate pain or headache.     . Ascorbic Acid (VITAMIN C) 1000 MG tablet Take 1,000 mg by mouth in the morning.    [name] aspirin EC 81 MG tablet Take 1 tablet (81 mg total) by mouth daily. (Patient taking differently: Take 81 mg by mouth in the morning.) 30 tablet 0  . atorvastatin (LIPITOR) 80 MG tablet Take 1 tablet (80 mg total) by mouth daily at 6 PM. Please keep upcoming appt in June 2022 with Dr. Tamala Julian before anymore refills. Thank you Final Attempt (Patient taking differently: Take 80 mg by mouth every evening. Please keep upcoming appt in June 2022 with Dr. Tamala Julian before anymore refills. Thank you Final Attempt) 90 tablet 0  . cetirizine (ZYRTEC) 10 MG tablet Take 10 mg by  mouth at bedtime as needed for allergies.    . Cholecalciferol (VITAMIN D3) 50 MCG (2000 UT) TABS Take 2,000 Units by mouth in the morning.    . clindamycin (CLEOCIN T) 1 % SWAB Apply 1 application topically 2 (two) times daily.    [name] dofetilide (TIKOSYN) 500 MCG capsule TAKE (1) CAPSULE TWICE DAILY. (Patient taking differently: Take 500 mcg by mouth 2 (two) times daily.) 180 capsule 3  . glipiZIDE (GLUCOTROL XL) 2.5 MG 24 hr tablet Take 2.5 mg by mouth daily with breakfast.    . loratadine (CLARITIN) 10 MG tablet Take 10 mg by mouth daily as needed for allergies.    . melatonin 5 MG TABS Take 5 mg by mouth at bedtime.    . Multiple Vitamin (MULTIVITAMIN WITH MINERALS) TABS tablet Take 1 tablet by mouth in the morning.    . Multiple Vitamins-Minerals (EYE VITAMINS PO) Take 1 tablet by mouth in the morning and at bedtime. Focus Select Zinc Free (    . nitroGLYCERIN (NITROSTAT) 0.4 MG SL tablet Place 0.4 mg under the tongue every  5 (five) minutes x 3 doses as needed for chest pain.    [name] olmesartan (BENICAR) 20 MG tablet Take 1 tablet (20 mg total) by mouth daily. Please keep upcoming appt in June 2022 with Dr. Tamala Julian before anymore refills. Thank you Final Attempt (Patient taking differently: Take 20 mg by mouth in the morning. Please keep upcoming appt in June 2022 with Dr. Tamala Julian before anymore refills. Thank you Final Attempt) 90 tablet 0  . pantoprazole (PROTONIX) 40 MG tablet TAKE 1 TABLET BY MOUTH TWICE DAILY. (Patient taking differently: Take 40 mg by mouth in the morning and at bedtime.) 180 tablet 3  . potassium chloride SA (KLOR-CON) 20 MEQ tablet Take 1 tablet (20 mEq total) by mouth daily. Please keep upcoming appt in June 2022 with Dr. Tamala Julian before anymore refills. Thank you Final Attempt (Patient taking differently: Take 20 mEq by mouth in the morning. Please keep upcoming appt in June 2022 with Dr. Tamala Julian before anymore refills. Thank you Final Attempt) 90 tablet 0  . XARELTO 20 MG TABS tablet  TAKE 1 TABLET ONCE DAILY WITH DINNER. (Patient taking differently: Take 20 mg by mouth daily with supper.) 60 tablet 5   No current facility-administered medications for this encounter.    Allergies  Allergen Reactions  . Metformin And Related Nausea And Vomiting and Other (See Comments)    Dizziness and disorientation, too    Social History   Socioeconomic History  . Marital status: Married    Spouse name: Not on file  . Number of children: 2  . Years of education: Not on file  . Highest education level: Not on file  Occupational History  . Occupation: Scientist, clinical (histocompatibility and immunogenetics): [employer]  Tobacco Use  . Smoking status: Never Smoker  . Smokeless tobacco: Never Used  Vaping Use  . Vaping Use: Never used  Substance and Sexual Activity  . Alcohol use: Yes    Alcohol/week: 9.0 - 12.0 standard drinks    Types: 7 - 10 Glasses of wine, 2 Standard drinks or equivalent per week  . Drug use: No  . Sexual activity: Not on file  Other Topics Concern  . Not on file  Social History Narrative   Caffeine: yes, coffee 2-3 cups daily, tea-rare.    Exercise-yes, 2-3X weekly, jog/run.    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    Marital Status: Married.    Children: 2 children-grown               Social Determinants of Health   Financial Resource Strain: Not on file  Food Insecurity: Not on file  Transportation Needs: Not on file  Physical Activity: Not on file  Stress: Not on file  Social Connections: Not on file  Intimate Partner Violence: Not on file     ROS- All systems are reviewed and negative except as per the HPI above.  Physical Exam: Vitals:   08/15/20 0834  BP: (!) 146/86  Pulse: (!) 55  Weight: 106.8 kg  Height: 6\' 1"  (1.854 m)    GEN- The patient is a well appearing obese male, alert and oriented x 3 today.   HEENT-head normocephalic, atraumatic, sclera clear, conjunctiva pink, hearing  intact, trachea midline. Lungs- Clear to ausculation bilaterally, normal work of breathing Heart- Regular rate and rhythm, bradycardia, no murmurs, rubs or gallops  GI- soft, NT, ND, + BS Extremities- no clubbing, cyanosis, or edema MS- no significant deformity or atrophy  Skin- no rash or lesion Psych- euthymic mood, full affect Neuro- strength and sensation are intact   Wt Readings from Last 3 Encounters:  08/15/20 106.8 kg  07/18/20 108 kg  05/27/20 107.5 kg    EKG today demonstrates  SB, 1st degree AV block Vent. rate 55 BPM PR interval 246 ms QRS duration 104 ms QT/QTcB 436/417 ms  Echo 04/18/19 demonstrated  1. Left ventricular ejection fraction, by visual estimation, is 50 to  55%. The left ventricle has normal function. There is no left ventricular  hypertrophy.  2. Definity contrast agent was given IV to delineate the left ventricular  endocardial borders.  3. The left ventricle has no regional wall motion abnormalities.  4. Left ventricular diastolic parameters are indeterminate.  5. Global right ventricle has normal systolic function.The right  ventricular size is not well visualized. Right vetricular wall thickness  was not assessed.  6. Left atrial size was normal.  7. Right atrial size was normal.  8. The mitral valve is normal in structure. No evidence of mitral valve  regurgitation. No evidence of mitral stenosis.  9. The tricuspid valve is normal in structure. Tricuspid valve  regurgitation is trivial.  10. The aortic valve is grossly normal. Aortic valve regurgitation is not  visualized. No evidence of aortic valve sclerosis or stenosis.  11. The pulmonic valve was normal in structure. Pulmonic valve  regurgitation is trivial.  12. The inferior vena cava is normal in size with greater than 50%  respiratory variability, suggesting right atrial pressure of 3 mmHg.  13. TR signal is inadequate for assessing pulmonary artery systolic  pressure.    Epic records are reviewed at length today  CHA2DS2-VASc Score = 4  The patient's score is based upon: CHF History: No HTN History: Yes Diabetes History: Yes Stroke History: No Vascular Disease History: Yes Age Score: 1 Gender Score: 0        ASSESSMENT AND PLAN: 1. Paroxysmal Atrial Fibrillation (ICD10:  I48.0) The patient's CHA2DS2-VASc score is 4, indicating a 4.8% annual risk of stroke.   S/p dofetilide loading 2/4-04/22/19 S/p afib ablation with Dr Quentin Ore on 07/18/20 Patient appears to be maintaining SR. Continue dofetilide 500 mcg BID. QT stable. Check bmet/mag today. Continue Xarelto 20 mg daily  2. Secondary Hypercoagulable State (ICD10:  D68.69) The patient is at significant risk for stroke/thromboembolism based upon his CHA2DS2-VASc Score of 4.  Continue Rivaroxaban (Xarelto).   3. Obstructive sleep apnea Patient reports compliance with CPAP therapy.  4. CAD No anginal symptoms.   5. HTN Stable, no changes today.   Follow up with Dr Quentin Ore and Dr Tamala Julian as scheduled.    [facility] [address] [place], [place] [address] [phone] 08/15/2020 9:04 AM

## 2020-08-21 NOTE — Progress Notes (Signed)
Cardiology Office Note:    Date:  08/22/2020   ID:  Carlos, Gutierrez Jan 06, 1951, MRN [id number]  PCP:  London Pepper, MD  Cardiologist:  Sinclair Grooms, MD   Referring MD: London Pepper, MD   Chief Complaint  Patient presents with   Coronary Artery Disease   Congestive Heart Failure     History of Present Illness:    Carlos Gutierrez is a 70 y.o. male with a hx of CAD (non-obstructive), HTN, HLD, DM, AFlutter (ablated 2003), AFib (ablated 07/18/20), DVT, and OSA w/CPAP   He is not having angina.  He does have moderate nonobstructive coronary disease.  He has not needed to use nitroglycerin.  He exercises achieving more than 150 minutes of activity per week.  His most recent cardiac difficulties have been related to paroxysmal atrial fibrillation and atrial flutter.  He was started on dofetilide without control.  He has subsequently undergone ablation by Dr. Quentin Ore and seems to be doing quite well.  Past Medical History:  Diagnosis Date   ACL tear    RIGHT KNEE   Atrial fibrillation ([place]) 2012   Rare occurrences. On ASA & Flecainide prn   Atrial flutter ([place])    s/p ablation 2008. On Flecainide prn.   Broken leg 1968   Concussion 1966   History of 4   Diabetes mellitus without complication ([place])    DVT (deep venous thrombosis) (H[facility])    LEFT LOWER LEG   Dyslipidemia    Essential hypertension, benign    Insomnia    Low back pain    Obesity (BMI 30-39.9)    Obstructive sleep apnea on CPAP    Mild   PAF (paroxysmal atrial fibrillation) ([place])    Rare occurrences. On ASA & Flecainide prn   Pure hypercholesterolemia    On Lipitor    Right shoulder pain    Intermittent   Status post ablation of atrial flutter 8756   Umbilical hernia     Past Surgical History:  Procedure Laterality Date   ablation for atrial flutter  01/2002   ATRIAL FIBRILLATION ABLATION N/A 07/18/2020   Procedure: ATRIAL FIBRILLATION ABLATION;  Surgeon: [name], MD;  Location: [facility];  Service: Cardiovascular;  Laterality: N/A;   CARDIAC CATHETERIZATION  12/26/01   CARDIAC CATHETERIZATION  04/18/2019   COLONOSCOPY WITH PROPOFOL N/A 03/12/2016   Procedure: COLONOSCOPY WITH PROPOFOL;  Surgeon: [name], MD;  Location: [facility];  Service: Endoscopy;  Laterality: N/A;   COLONOSCOPY WITH PROPOFOL N/A 05/05/2019   Procedure: COLONOSCOPY WITH PROPOFOL;  Surgeon: [name], MD;  Location: [facility];  Service: Endoscopy;  Laterality: N/A;   coronary angiography  1997   40% stenosis   ESOPHAGOGASTRODUODENOSCOPY (EGD) WITH PROPOFOL N/A 10/15/2016   Procedure: ESOPHAGOGASTRODUODENOSCOPY (EGD) WITH PROPOFOL;  Surgeon: [name], MD;  [facility]: [facility];  Service: Endoscopy;  Laterality: N/A;   ESOPHAGOGASTRODUODENOSCOPY (EGD) WITH PROPOFOL N/A 12/14/2019   Procedure: ESOPHAGOGASTRODUODENOSCOPY (EGD) WITH PROPOFOL;  Surgeon: [name], MD;  [facility];  Service: Endoscopy;  Laterality: N/A;   LEFT HEART CATH AND CORONARY ANGIOGRAPHY N/A 02/11/2017   Procedure: LEFT HEART CATH AND CORONARY ANGIOGRAPHY;  Surgeon: Belva Crome, MD;  Location: [facility];  Service: Cardiovascular;  Laterality: N/A;   LEFT HEART CATH AND CORONARY ANGIOGRAPHY N/A 04/18/2019   Procedure: LEFT HEART CATH AND CORONARY ANGIOGRAPHY;  Surgeon: Belva Crome, MD;  Location: [facility];  Service: Cardiovascular;  Laterality:  N/A;   POLYPECTOMY  05/05/2019   Procedure: POLYPECTOMY;  Surgeon: [name], MD;  Location: [facility];  Service: Endoscopy;;   right knee surgery  1996   ACL, meniscus problems   UMBILICAL HERNIA REPAIR  04/2015    Current Medications: Current Meds  Medication Sig   acetaminophen (TYLENOL) 500 MG tablet Take 1,000 mg by mouth as needed for mild pain, moderate pain or headache.    Ascorbic Acid (VITAMIN C) 1000 MG tablet Take 1,000 mg by mouth in the morning.   aspirin EC 81 MG tablet Take 1 tablet (81 mg total) by  mouth daily. (Patient taking differently: Take 81 mg by mouth in the morning.)   atorvastatin (LIPITOR) 80 MG tablet Take 1 tablet (80 mg total) by mouth daily at 6 PM. Please keep upcoming appt in June 2022 with Dr. Tamala Julian before anymore refills. Thank you Final Attempt (Patient taking differently: Take 80 mg by mouth every evening. Please keep upcoming appt in June 2022 with Dr. Tamala Julian before anymore refills. Thank you Final Attempt)   cetirizine (ZYRTEC) 10 MG tablet Take 10 mg by mouth at bedtime as needed for allergies.   Cholecalciferol (VITAMIN D3) 50 MCG (2000 UT) TABS Take 2,000 Units by mouth in the morning.   clindamycin (CLEOCIN T) 1 % SWAB Apply 1 application topically 2 (two) times daily.   dofetilide (TIKOSYN) 500 MCG capsule TAKE (1) CAPSULE TWICE DAILY. (Patient taking differently: Take 500 mcg by mouth 2 (two) times daily.)   glipiZIDE (GLUCOTROL XL) 2.5 MG 24 hr tablet Take 2.5 mg by mouth daily with breakfast.   loratadine (CLARITIN) 10 MG tablet Take 10 mg by mouth daily as needed for allergies.   melatonin 5 MG TABS Take 5 mg by mouth at bedtime.   Multiple Vitamin (MULTIVITAMIN WITH MINERALS) TABS tablet Take 1 tablet by mouth in the morning.   Multiple Vitamins-Minerals (EYE VITAMINS PO) Take 1 tablet by mouth in the morning and at bedtime. Focus Select Zinc Free (   nitroGLYCERIN (NITROSTAT) 0.4 MG SL tablet Place 0.4 mg under the tongue every 5 (five) minutes x 3 doses as needed for chest pain.   olmesartan (BENICAR) 20 MG tablet Take 1 tablet (20 mg total) by mouth daily. Please keep upcoming appt in June 2022 with Dr. Tamala Julian before anymore refills. Thank you Final Attempt (Patient taking differently: Take 20 mg by mouth in the morning. Please keep upcoming appt in June 2022 with Dr. Tamala Julian before anymore refills. Thank you Final Attempt)   pantoprazole (PROTONIX) 40 MG tablet TAKE 1 TABLET BY MOUTH TWICE DAILY. (Patient taking differently: Take 40 mg by mouth in the morning and  at bedtime.)   potassium chloride SA (KLOR-CON) 20 MEQ tablet Take 1 tablet (20 mEq total) by mouth daily. Please keep upcoming appt in June 2022 with Dr. Tamala Julian before anymore refills. Thank you Final Attempt (Patient taking differently: Take 20 mEq by mouth in the morning. Please keep upcoming appt in June 2022 with Dr. Tamala Julian before anymore refills. Thank you Final Attempt)   XARELTO 20 MG TABS tablet TAKE 1 TABLET ONCE DAILY WITH DINNER. (Patient taking differently: Take 20 mg by mouth daily with supper.)     Allergies:   Metformin and related   Social History   Socioeconomic History   Marital status: Married    Spouse name: Not on file   Number of children: 2   Years of education: Not on file   Highest education level: Not  on file  Occupational History   Occupation: Scientist, clinical (histocompatibility and immunogenetics): OTHER  Tobacco Use   Smoking status: Never   Smokeless tobacco: Never  Vaping Use   Vaping Use: Never used  Substance and Sexual Activity   Alcohol use: Yes    Alcohol/week: 9.0 - 12.0 standard drinks    Types: 7 - 10 Glasses of wine, 2 Standard drinks or equivalent per week   Drug use: No   Sexual activity: Not on file  Other Topics Concern   Not on file  Social History Narrative   Caffeine: yes, coffee 2-3 cups daily, tea-rare.    Exercise-yes, 2-3X weekly, jog/run.    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    Marital Status: Married.    Children: 2 children-grown               Social Determinants of Radio broadcast assistant Strain: Not on file  Food Insecurity: Not on file  Transportation Needs: Not on file  Physical Activity: Not on file  Stress: Not on file  Social Connections: Not on file     Family History: The patient's family history includes COPD in his mother; Colon cancer (age of onset: 40) in his maternal grandfather; Diabetes in his mother; Diabetes (age of onset: 67) in his brother; Heart  attack in his father and paternal grandfather; Heart disease in his mother; Heart disease (age of onset: 24) in his father; Heart failure in his father; Hypertension in his father; Hypertension (age of onset: 68) in his brother; Hypertension (age of onset: 25) in his mother. There is no history of Stroke.  ROS:   Please see the history of present illness.    A lot of stress because his wife has brain tumor/malignant cancer.  He is having to do a lot of the care independently at home.  He is working from home now.  Does not want to retire until he is 72.  All other systems reviewed and are negative.  EKGs/Labs/Other Studies Reviewed:    The following studies were reviewed today: No recent imaging data  EKG:  EKG from A. fib clinic was reviewed.  Recent Labs: 07/11/2020: Hemoglobin 16.3; Platelets 172 08/15/2020: BUN 15; Creatinine, Ser 1.07; Magnesium 2.0; Potassium 4.7; Sodium 138  Recent Lipid Panel    Component Value Date/Time   CHOL 133 10/05/2017 0906   TRIG 90 10/05/2017 0906   HDL 45 10/05/2017 0906   CHOLHDL 3.0 10/05/2017 0906   LDLCALC 70 10/05/2017 0906    Physical Exam:    VS:  BP 122/72   Pulse (!) 57   Ht 6\' 1"  (1.854 m)   Wt 235 lb (106.6 kg)   SpO2 98%   BMI 31.00 kg/m     Wt Readings from Last 3 Encounters:  08/22/20 235 lb (106.6 kg)  08/15/20 235 lb 6.4 oz (106.8 kg)  07/18/20 238 lb (108 kg)     GEN: Obese. No acute distress HEENT: Normal NECK: No JVD. LYMPHATICS: No lymphadenopathy CARDIAC: No murmur. RRR no gallop, or edema. VASCULAR:  Normal Pulses. No bruits. RESPIRATORY:  Clear to auscultation without rales, wheezing or rhonchi  ABDOMEN: Soft, non-tender, non-distended, No pulsatile mass, MUSCULOSKELETAL: No deformity  SKIN: Warm and dry NEUROLOGIC:  Alert and oriented x 3 PSYCHIATRIC:  Normal affect   ASSESSMENT:    1. Paroxysmal atrial fibrillation (HCC)   2. Secondary hypercoagulable state ([place])   3. Coronary artery  disease involving  native heart, unspecified vessel or lesion type, unspecified whether angina present   4. Primary hypertension   5. Atrial flutter, unspecified type ([place])   6. Pure hypercholesterolemia   7. Type 2 diabetes mellitus without complication, without long-term current use of insulin (HCC)    PLAN:    In order of problems listed above:  In rhythm today on auscultation.  Continue follow-up in A. fib clinic. Continue Xarelto therapy. Secondary prevention discussed in detail.  Use nitroglycerin for chest discomfort.  Report chest discomfort if any. Continue Benicar with target 130/80 mmHg. See above under #1. Continue high intensity statin therapy with target LDL less than 70. Hemoglobin A1c is 6.5.  We may need to consider SGLT2 therapy in this patient who has high cardiovascular risk.  Overall education and awareness concerning secondary risk prevention was discussed in detail: LDL less than 70, hemoglobin A1c less than 7, blood pressure target less than 130/80 mmHg, >150 minutes of moderate aerobic activity per week, avoidance of smoking, weight control (via diet and exercise), and continued surveillance/management of/for obstructive sleep apnea.  Commended that he follow-up with Dr.Varanasiin 1 year she has coronary disease and is at high risk for coronary ischemia    Medication Adjustments/Labs and Tests Ordered: Current medicines are reviewed at length with the patient today.  Concerns regarding medicines are outlined above.  No orders of the defined types were placed in this encounter.  No orders of the defined types were placed in this encounter.   Patient Instructions  Medication Instructions:  Your physician recommends that you continue on your current medications as directed. Please refer to the Current Medication list given to you today.  *If you need a refill on your cardiac medications before your next appointment, please call your pharmacy*   Lab Work: None If you have  labs (blood work) drawn today and your tests are completely normal, you will receive your results only by: [place] (if you have MyChart) OR A paper copy in the mail If you have any lab test that is abnormal or we need to change your treatment, we will call you to review the results.   Testing/Procedures: None   Follow-Up: At [facility], you and your health needs are our priority.  As part of our continuing mission to provide you with exceptional heart care, we have created designated Provider Care Teams.  These Care Teams include your primary Cardiologist (physician) and Advanced Practice Providers (APPs -  Physician Assistants and Nurse Practitioners) who all work together to provide you with the care you need, when you need it.  We recommend signing up for the patient portal called "MyChart".  Sign up information is provided on this After Visit Summary.  MyChart is used to connect with patients for Virtual Visits (Telemedicine).  Patients are able to view lab/test results, encounter notes, upcoming appointments, etc.  Non-urgent messages can be sent to your provider as well.   To learn more about what you can do with MyChart, go to [email or web address].    Your next appointment:   1 year(s)  The format for your next appointment:   In Person  Provider:   You may see Larae Grooms, MD or one of the following Advanced Practice Providers on your designated Care Team:   Melina Copa, PA-C Ermalinda Barrios, PA-C Richardson Dopp, PA-C   Other Instructions     Signed, Sinclair Grooms, MD  08/22/2020 4:15 PM    [facility]  

## 2020-08-22 ENCOUNTER — Encounter: Payer: Self-pay | Admitting: Interventional Cardiology

## 2020-08-22 ENCOUNTER — Other Ambulatory Visit: Payer: Self-pay

## 2020-08-22 ENCOUNTER — Ambulatory Visit (INDEPENDENT_AMBULATORY_CARE_PROVIDER_SITE_OTHER): Payer: BC Managed Care – PPO | Admitting: Interventional Cardiology

## 2020-08-22 VITALS — BP 122/72 | HR 57 | Ht 73.0 in | Wt 235.0 lb

## 2020-08-22 DIAGNOSIS — E119 Type 2 diabetes mellitus without complications: Secondary | ICD-10-CM

## 2020-08-22 DIAGNOSIS — I4892 Unspecified atrial flutter: Secondary | ICD-10-CM

## 2020-08-22 DIAGNOSIS — I480 Paroxysmal atrial fibrillation: Secondary | ICD-10-CM

## 2020-08-22 DIAGNOSIS — I2510 Atherosclerotic heart disease of native coronary artery without angina pectoris: Secondary | ICD-10-CM

## 2020-08-22 DIAGNOSIS — I10 Essential (primary) hypertension: Secondary | ICD-10-CM

## 2020-08-22 DIAGNOSIS — D6869 Other thrombophilia: Secondary | ICD-10-CM | POA: Diagnosis not present

## 2020-08-22 DIAGNOSIS — E7800 Pure hypercholesterolemia, unspecified: Secondary | ICD-10-CM

## 2020-08-22 NOTE — Patient Instructions (Signed)
Medication Instructions:  Your physician recommends that you continue on your current medications as directed. Please refer to the Current Medication list given to you today.  *If you need a refill on your cardiac medications before your next appointment, please call your pharmacy*   Lab Work: None If you have labs (blood work) drawn today and your tests are completely normal, you will receive your results only by: [place] (if you have MyChart) OR A paper copy in the mail If you have any lab test that is abnormal or we need to change your treatment, we will call you to review the results.   Testing/Procedures: None   Follow-Up: At [facility], you and your health needs are our priority.  As part of our continuing mission to provide you with exceptional heart care, we have created designated Provider Care Teams.  These Care Teams include your primary Cardiologist (physician) and Advanced Practice Providers (APPs -  Physician Assistants and Nurse Practitioners) who all work together to provide you with the care you need, when you need it.  We recommend signing up for the patient portal called "MyChart".  Sign up information is provided on this After Visit Summary.  MyChart is used to connect with patients for Virtual Visits (Telemedicine).  Patients are able to view lab/test results, encounter notes, upcoming appointments, etc.  Non-urgent messages can be sent to your provider as well.   To learn more about what you can do with MyChart, go to [email or web address].    Your next appointment:   1 year(s)  The format for your next appointment:   In Person  Provider:   You may see Larae Grooms, MD or one of the following Advanced Practice Providers on your designated Care Team:   Melina Copa, PA-C Ermalinda Barrios, PA-C Richardson Dopp, [place]   Other Instructions

## 2020-09-08 ENCOUNTER — Other Ambulatory Visit: Payer: Self-pay | Admitting: Interventional Cardiology

## 2020-09-10 DIAGNOSIS — L570 Actinic keratosis: Secondary | ICD-10-CM | POA: Diagnosis not present

## 2020-09-10 DIAGNOSIS — L718 Other rosacea: Secondary | ICD-10-CM | POA: Diagnosis not present

## 2020-09-10 DIAGNOSIS — D1801 Hemangioma of skin and subcutaneous tissue: Secondary | ICD-10-CM | POA: Diagnosis not present

## 2020-09-10 DIAGNOSIS — L905 Scar conditions and fibrosis of skin: Secondary | ICD-10-CM | POA: Diagnosis not present

## 2020-09-10 DIAGNOSIS — D485 Neoplasm of uncertain behavior of skin: Secondary | ICD-10-CM | POA: Diagnosis not present

## 2020-09-10 DIAGNOSIS — C4442 Squamous cell carcinoma of skin of scalp and neck: Secondary | ICD-10-CM | POA: Diagnosis not present

## 2020-09-10 DIAGNOSIS — L821 Other seborrheic keratosis: Secondary | ICD-10-CM | POA: Diagnosis not present

## 2020-09-23 DIAGNOSIS — D044 Carcinoma in situ of skin of scalp and neck: Secondary | ICD-10-CM | POA: Diagnosis not present

## 2020-10-17 ENCOUNTER — Encounter: Payer: Self-pay | Admitting: Cardiology

## 2020-10-17 ENCOUNTER — Other Ambulatory Visit: Payer: Self-pay

## 2020-10-17 ENCOUNTER — Ambulatory Visit (INDEPENDENT_AMBULATORY_CARE_PROVIDER_SITE_OTHER): Payer: BC Managed Care – PPO | Admitting: Cardiology

## 2020-10-17 VITALS — BP 138/74 | HR 60 | Ht 73.0 in | Wt 233.0 lb

## 2020-10-17 DIAGNOSIS — Z79899 Other long term (current) drug therapy: Secondary | ICD-10-CM

## 2020-10-17 DIAGNOSIS — I10 Essential (primary) hypertension: Secondary | ICD-10-CM

## 2020-10-17 DIAGNOSIS — I480 Paroxysmal atrial fibrillation: Secondary | ICD-10-CM

## 2020-10-17 NOTE — Patient Instructions (Addendum)
Medication Instructions:  Your physician recommends that you continue on your current medications as directed. Please refer to the Current Medication list given to you today. *If you need a refill on your cardiac medications before your next appointment, please call your pharmacy*  Lab Work: None ordered. If you have labs (blood work) drawn today and your tests are completely normal, you will receive your results only by: [place] (if you have MyChart) OR A paper copy in the mail If you have any lab test that is abnormal or we need to change your treatment, we will call you to review the results.  Testing/Procedures: None ordered.  Follow-Up: At [facility], you and your health needs are our priority.  As part of our continuing mission to provide you with exceptional heart care, we have created designated Provider Care Teams.  These Care Teams include your primary Cardiologist (physician) and Advanced Practice Providers (APPs -  Physician Assistants and Nurse Practitioners) who all work together to provide you with the care you need, when you need it.  Your next appointment: 4 months with BMP and magnesium labs with  one of the following Advanced Practice Providers on your designated Care Team:   Carlos Gutierrez, Carlos Gutierrez, Carlos  Your physician wants you to follow-up in: 9 months with Dr. Quentin Ore.   You will receive a reminder letter in the mail two months in advance. If you don't receive a letter, please call our office to schedule the follow-up appointment.

## 2020-10-17 NOTE — Progress Notes (Signed)
Electrophysiology Office Follow up Visit Note:    Date:  10/17/2020   ID:  Carlos Gutierrez, Carlos Gutierrez 25-Mar-1950, MRN [id number]  PCP:  London Pepper, MD  [facility] Cardiologist:  Sinclair Grooms, MD  [facility] Electrophysiologist:  Cristopher Peru, MD    Interval History:    Carlos Gutierrez is a 70 y.o. male who presents for a follow up visit after an atrial fibrillation ablation on Jul 18, 2020.  During the procedure the pulmonary veins were isolated.  The patient saw Adline Peals in follow-up on August 15, 2020.  At that appointment he reported no recurrent episodes of atrial fibrillation.  He takes dofetilide 500 mcg twice daily.  Since the procedure he has not had a recurrence of arrhythmia.  He tells me his groin sites healed well.       Past Medical History:  Diagnosis Date   ACL tear    RIGHT KNEE   Atrial fibrillation ([place]) 2012   Rare occurrences. On ASA & Flecainide prn   Atrial flutter ([place])    s/p ablation 2008. On Flecainide prn.   Broken leg 1968   Concussion 1966   History of 4   Diabetes mellitus without complication ([place])    DVT (deep venous thrombosis) (HCC)    LEFT LOWER LEG   Dyslipidemia    Essential hypertension, benign    Insomnia    Low back pain    Obesity (BMI 30-39.9)    Obstructive sleep apnea on CPAP    Mild   PAF (paroxysmal atrial fibrillation) ([place])    Rare occurrences. On ASA & Flecainide prn   Pure hypercholesterolemia    On Lipitor    Right shoulder pain    Intermittent   Status post ablation of atrial flutter [id number]   Umbilical hernia     Past Surgical History:  Procedure Laterality Date   ablation for atrial flutter  01/2002   ATRIAL FIBRILLATION ABLATION N/A 07/18/2020   Procedure: ATRIAL FIBRILLATION ABLATION;  Surgeon: [name], MD;  Location: [place] CV LAB;  Service: Cardiovascular;  Laterality: N/A;   CARDIAC CATHETERIZATION  12/26/01   CARDIAC CATHETERIZATION  04/18/2019   COLONOSCOPY WITH PROPOFOL N/A  03/12/2016   Procedure: COLONOSCOPY WITH PROPOFOL;  Surgeon: [name], MD;  [facility];  Service: Endoscopy;  Laterality: N/A;   COLONOSCOPY WITH PROPOFOL N/A 05/05/2019   Procedure: COLONOSCOPY WITH PROPOFOL;  Surgeon: [name], MD;  Location: [place];  Service: Endoscopy;  Laterality: N/A;   coronary angiography  1997   40% stenosis   ESOPHAGOGASTRODUODENOSCOPY (EGD) WITH PROPOFOL N/A 10/15/2016   Procedure: ESOPHAGOGASTRODUODENOSCOPY (EGD) WITH PROPOFOL;  Surgeon: [name], MD;  [facility];  Service: Endoscopy;  Laterality: N/A;   ESOPHAGOGASTRODUODENOSCOPY (EGD) WITH PROPOFOL N/A 12/14/2019   Procedure: ESOPHAGOGASTRODUODENOSCOPY (EGD) WITH PROPOFOL;  Surgeon: [name], MD;  [facility];  Service: Endoscopy;  Laterality: N/A;   LEFT HEART CATH AND CORONARY ANGIOGRAPHY N/A 02/11/2017   Procedure: LEFT HEART CATH AND CORONARY ANGIOGRAPHY;  Surgeon: Belva Crome, MD;  Location: [place] CV LAB;  Service: Cardiovascular;  Laterality: N/A;   LEFT HEART CATH AND CORONARY ANGIOGRAPHY N/A 04/18/2019   Procedure: LEFT HEART CATH AND CORONARY ANGIOGRAPHY;  Surgeon: Belva Crome, MD;  Location: [place] CV LAB;  Service: Cardiovascular;  Laterality: N/A;   POLYPECTOMY  05/05/2019   Procedure: POLYPECTOMY;  Surgeon: [name], MD;  Location: [place];  Service: Endoscopy;;   right knee  surgery  1996   ACL, meniscus problems   UMBILICAL HERNIA REPAIR  04/2015    Current Medications: Current Meds  Medication Sig   acetaminophen (TYLENOL) 500 MG tablet Take 1,000 mg by mouth as needed for mild pain, moderate pain or headache.    Ascorbic Acid (VITAMIN C) 1000 MG tablet Take 1,000 mg by mouth in the morning.   aspirin EC 81 MG tablet Take 1 tablet (81 mg total) by mouth daily. (Patient taking differently: Take 81 mg by mouth in the morning.)   atorvastatin (LIPITOR) 80 MG tablet Take 1 tablet (80 mg total) by mouth daily at 6 PM.  Please keep upcoming appt in June 2022 with Dr. Tamala Julian before anymore refills. Thank you Final Attempt   cetirizine (ZYRTEC) 10 MG tablet Take 10 mg by mouth at bedtime as needed for allergies.   Cholecalciferol (VITAMIN D3) 50 MCG (2000 UT) TABS Take 2,000 Units by mouth in the morning.   clindamycin (CLEOCIN T) 1 % SWAB Apply 1 application topically 2 (two) times daily.   dofetilide (TIKOSYN) 500 MCG capsule TAKE (1) CAPSULE TWICE DAILY. (Patient taking differently: Take 500 mcg by mouth 2 (two) times daily.)   glipiZIDE (GLUCOTROL XL) 2.5 MG 24 hr tablet Take 2.5 mg by mouth daily with breakfast.   loratadine (CLARITIN) 10 MG tablet Take 10 mg by mouth daily as needed for allergies.   melatonin 5 MG TABS Take 5 mg by mouth at bedtime.   Multiple Vitamin (MULTIVITAMIN WITH MINERALS) TABS tablet Take 1 tablet by mouth in the morning.   Multiple Vitamins-Minerals (EYE VITAMINS PO) Take 1 tablet by mouth in the morning and at bedtime. Focus Select Zinc Free (   nitroGLYCERIN (NITROSTAT) 0.4 MG SL tablet Place 0.4 mg under the tongue every 5 (five) minutes x 3 doses as needed for chest pain.   olmesartan (BENICAR) 20 MG tablet Take 1 tablet (20 mg total) by mouth daily. Please keep upcoming appt in June 2022 with Dr. Tamala Julian before anymore refills. Thank you Final Attempt (Patient taking differently: Take 20 mg by mouth in the morning. Please keep upcoming appt in June 2022 with Dr. Tamala Julian before anymore refills. Thank you Final Attempt)   pantoprazole (PROTONIX) 40 MG tablet TAKE 1 TABLET BY MOUTH TWICE DAILY. (Patient taking differently: Take 40 mg by mouth in the morning and at bedtime.)   potassium chloride SA (KLOR-CON) 20 MEQ tablet Take 1 tablet (20 mEq total) by mouth daily. Please keep upcoming appt in June 2022 with Dr. Tamala Julian before anymore refills. Thank you Final Attempt   XARELTO 20 MG TABS tablet TAKE 1 TABLET ONCE DAILY WITH DINNER. (Patient taking differently: Take 20 mg by mouth daily with  supper.)     Allergies:   Metformin and related   Social History   Socioeconomic History   Marital status: Married    Spouse name: Not on file   Number of children: 2   Years of education: Not on file   Highest education level: Not on file  Occupational History   Occupation: Hotel manager    Employer: OTHER  Tobacco Use   Smoking status: Never   Smokeless tobacco: Never  Vaping Use   Vaping Use: Never used  Substance and Sexual Activity   Alcohol use: Yes    Alcohol/week: 9.0 - 12.0 standard drinks    Types: 7 - 10 Glasses of wine, 2 Standard drinks or equivalent per week   Drug use: No   Sexual  activity: Not on file  Other Topics Concern   Not on file  Social History Narrative   Caffeine: yes, coffee 2-3 cups daily, tea-rare.    Exercise-yes, 2-3X weekly, jog/run.    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    Marital Status: Married.    Children: 2 children-grown               Social Determinants of Radio broadcast assistant Strain: Not on file  Food Insecurity: Not on file  Transportation Needs: Not on file  Physical Activity: Not on file  Stress: Not on file  Social Connections: Not on file     Family History: The patient's family history includes COPD in his mother; Colon cancer (age of onset: 25) in his maternal grandfather; Diabetes in his mother; Diabetes (age of onset: 4) in his brother; Heart attack in his father and paternal grandfather; Heart disease in his mother; Heart disease (age of onset: 19) in his father; Heart failure in his father; Hypertension in his father; Hypertension (age of onset: 23) in his brother; Hypertension (age of onset: 72) in his mother. There is no history of Stroke.  ROS:   Please see the history of present illness.    All other systems reviewed and are negative.  EKGs/Labs/Other Studies Reviewed:    The following studies were reviewed today:    EKG:   The ekg ordered today demonstrates sinus rhythm, QTC is 440 ms  Recent Labs: 07/11/2020: Hemoglobin 16.3; Platelets 172 08/15/2020: BUN 15; Creatinine, Ser 1.07; Magnesium 2.0; Potassium 4.7; Sodium 138  Recent Lipid Panel    Component Value Date/Time   CHOL 133 10/05/2017 0906   TRIG 90 10/05/2017 0906   HDL 45 10/05/2017 0906   CHOLHDL 3.0 10/05/2017 0906   LDLCALC 70 10/05/2017 0906    Physical Exam:    VS:  BP 138/74   Pulse 60   Ht '6\' 1"'$  (1.854 m)   Wt 233 lb (105.7 kg)   BMI 30.74 kg/m     Wt Readings from Last 3 Encounters:  10/17/20 233 lb (105.7 kg)  08/22/20 235 lb (106.6 kg)  08/15/20 235 lb 6.4 oz (106.8 kg)     GEN:  Well nourished, well developed in no acute distress HEENT: Normal NECK: No JVD; No carotid bruits LYMPHATICS: No lymphadenopathy CARDIAC: RRR, no murmurs, rubs, gallops RESPIRATORY:  Clear to auscultation without rales, wheezing or rhonchi  ABDOMEN: Soft, non-tender, non-distended MUSCULOSKELETAL:  No edema; No deformity  SKIN: Warm and dry NEUROLOGIC:  Alert and oriented x 3 PSYCHIATRIC:  Normal affect   ASSESSMENT:    1. Paroxysmal atrial fibrillation (HCC)   2. Primary hypertension   3. Encounter for long-term (current) use of high-risk medication    PLAN:    In order of problems listed above:  1. Paroxysmal atrial fibrillation (HCC) Maintaining sinus rhythm after ablation procedure.  On Tikosyn.  On Xarelto for stroke prophylaxis.  I will have him see one of the PAs in 4 months with blood work at that visit for his Tikosyn.  I will see him 9 months from now.  At that appointment, we can consider discontinuing the dofetilide if he has continued to maintain normal rhythm.   2. Primary hypertension At goal.  Continue current medication regimen  3. Encounter for long-term (current) use of high-risk medication QTC 440 ms.  Blood work in 4 months with PA visit.     Medication Adjustments/Labs  and Tests Ordered: Current  medicines are reviewed at length with the patient today.  Concerns regarding medicines are outlined above.  Orders Placed This Encounter  Procedures   EKG 12-Lead    No orders of the defined types were placed in this encounter.    Signed, Lars Mage, MD, [facility], [facility] 10/17/2020 8:41 AM    Electrophysiology [facility]

## 2020-10-30 ENCOUNTER — Other Ambulatory Visit: Payer: Self-pay | Admitting: Interventional Cardiology

## 2020-10-30 ENCOUNTER — Other Ambulatory Visit: Payer: Self-pay | Admitting: Physician Assistant

## 2020-10-30 NOTE — Telephone Encounter (Signed)
Prescription refill request for Xarelto received.  Indication: Afib Last office visit: 10/17/20 Quentin Ore) Weight: 105.7kg Age: 70 Scr: 1.07 (08/15/20) CrCl: 98.50m/min  Appropriate dose and refill sent to requested pharmacy.

## 2020-11-06 ENCOUNTER — Other Ambulatory Visit: Payer: Self-pay | Admitting: Interventional Cardiology

## 2020-12-05 DIAGNOSIS — E1169 Type 2 diabetes mellitus with other specified complication: Secondary | ICD-10-CM | POA: Diagnosis not present

## 2020-12-05 DIAGNOSIS — E785 Hyperlipidemia, unspecified: Secondary | ICD-10-CM | POA: Diagnosis not present

## 2020-12-05 DIAGNOSIS — Z86718 Personal history of other venous thrombosis and embolism: Secondary | ICD-10-CM | POA: Diagnosis not present

## 2020-12-05 DIAGNOSIS — I4891 Unspecified atrial fibrillation: Secondary | ICD-10-CM | POA: Diagnosis not present

## 2020-12-05 DIAGNOSIS — N400 Enlarged prostate without lower urinary tract symptoms: Secondary | ICD-10-CM | POA: Diagnosis not present

## 2020-12-05 DIAGNOSIS — I10 Essential (primary) hypertension: Secondary | ICD-10-CM | POA: Diagnosis not present

## 2021-01-01 ENCOUNTER — Other Ambulatory Visit: Payer: Self-pay | Admitting: Interventional Cardiology

## 2021-01-04 DIAGNOSIS — U071 COVID-19: Secondary | ICD-10-CM | POA: Diagnosis not present

## 2021-02-17 NOTE — Progress Notes (Signed)
PCP:  London Pepper, MD Primary Cardiologist: Sinclair Grooms, MD Electrophysiologist: [name], MD   Carlos Gutierrez is a 70 y.o. male seen today for [name], MD for routine electrophysiology followup.  Since last being seen in our clinic the patient reports doing very well.  he denies chest pain, palpitations, dyspnea, PND, orthopnea, nausea, vomiting, dizziness, syncope, edema, weight gain, or early satiety.  Past Medical History:  Diagnosis Date   ACL tear    RIGHT KNEE   Atrial fibrillation ([place]) 2012   Rare occurrences. On ASA & Flecainide prn   Atrial flutter ([place])    s/p ablation 2008. On Flecainide prn.   Broken leg 1968   Concussion 1966   History of 4   Diabetes mellitus without complication ([place])    DVT (deep venous thrombosis) (HCC)    LEFT LOWER LEG   Dyslipidemia    Essential hypertension, benign    Insomnia    Low back pain    Obesity (BMI 30-39.9)    Obstructive sleep apnea on CPAP    Mild   PAF (paroxysmal atrial fibrillation) ([place])    Rare occurrences. On ASA & Flecainide prn   Pure hypercholesterolemia    On Lipitor    Right shoulder pain    Intermittent   Status post ablation of atrial flutter 6063   Umbilical hernia    Past Surgical History:  Procedure Laterality Date   ablation for atrial flutter  01/2002   ATRIAL FIBRILLATION ABLATION N/A 07/18/2020   Procedure: ATRIAL FIBRILLATION ABLATION;  Surgeon: [name], MD;  Location: [place] CV LAB;  Service: Cardiovascular;  Laterality: N/A;   CARDIAC CATHETERIZATION  12/26/01   CARDIAC CATHETERIZATION  04/18/2019   COLONOSCOPY WITH PROPOFOL N/A 03/12/2016   Procedure: COLONOSCOPY WITH PROPOFOL;  Surgeon: [name], MD;  [facility];  Service: Endoscopy;  Laterality: N/A;   COLONOSCOPY WITH PROPOFOL N/A 05/05/2019   Procedure: COLONOSCOPY WITH PROPOFOL;  Surgeon: [name], MD;  Location: [place];  Service: Endoscopy;  Laterality: N/A;    coronary angiography  1997   40% stenosis   ESOPHAGOGASTRODUODENOSCOPY (EGD) WITH PROPOFOL N/A 10/15/2016   Procedure: ESOPHAGOGASTRODUODENOSCOPY (EGD) WITH PROPOFOL;  Surgeon: [name], MD;  [facility];  Service: Endoscopy;  Laterality: N/A;   ESOPHAGOGASTRODUODENOSCOPY (EGD) WITH PROPOFOL N/A 12/14/2019   Procedure: ESOPHAGOGASTRODUODENOSCOPY (EGD) WITH PROPOFOL;  Surgeon: [name], MD;  [facility];  Service: Endoscopy;  Laterality: N/A;   LEFT HEART CATH AND CORONARY ANGIOGRAPHY N/A 02/11/2017   Procedure: LEFT HEART CATH AND CORONARY ANGIOGRAPHY;  Surgeon: Belva Crome, MD;  Location: [place] CV LAB;  Service: Cardiovascular;  Laterality: N/A;   LEFT HEART CATH AND CORONARY ANGIOGRAPHY N/A 04/18/2019   Procedure: LEFT HEART CATH AND CORONARY ANGIOGRAPHY;  Surgeon: Belva Crome, MD;  Location: [place] CV LAB;  Service: Cardiovascular;  Laterality: N/A;   POLYPECTOMY  05/05/2019   Procedure: POLYPECTOMY;  Surgeon: [name], MD;  Location: [facility];  Service: Endoscopy;;   right knee surgery  1996   ACL, meniscus problems   UMBILICAL HERNIA REPAIR  04/2015    Current Outpatient Medications  Medication Sig Dispense Refill   acetaminophen (TYLENOL) 500 MG tablet Take 1,000 mg by mouth as needed for mild pain, moderate pain or headache.      Ascorbic Acid (VITAMIN C) 1000 MG tablet Take 1,000 mg by mouth in the morning.     aspirin EC 81 MG  tablet Take 1 tablet (81 mg total) by mouth daily. (Patient taking differently: Take 81 mg by mouth in the morning.) 30 tablet 0   atorvastatin (LIPITOR) 80 MG tablet Take 1 tablet (80 mg total) by mouth daily at 6 PM. Please keep upcoming appt in June 2022 with Dr. Tamala Julian before anymore refills. Thank you Final Attempt 90 tablet 3   cetirizine (ZYRTEC) 10 MG tablet Take 10 mg by mouth at bedtime as needed for allergies.     Cholecalciferol (VITAMIN D3) 50 MCG (2000 UT) TABS Take 2,000 Units by mouth in the  morning.     clindamycin (CLEOCIN T) 1 % SWAB Apply 1 application topically 2 (two) times daily.     dofetilide (TIKOSYN) 500 MCG capsule TAKE (1) CAPSULE TWICE DAILY. (Patient taking differently: Take 500 mcg by mouth 2 (two) times daily.) 180 capsule 3   glipiZIDE (GLUCOTROL XL) 2.5 MG 24 hr tablet Take 2.5 mg by mouth daily with breakfast.     loratadine (CLARITIN) 10 MG tablet Take 10 mg by mouth daily as needed for allergies.     melatonin 5 MG TABS Take 5 mg by mouth at bedtime.     Multiple Vitamin (MULTIVITAMIN WITH MINERALS) TABS tablet Take 1 tablet by mouth in the morning.     Multiple Vitamins-Minerals (EYE VITAMINS PO) Take 1 tablet by mouth in the morning and at bedtime. Focus Select Zinc Free (     nitroGLYCERIN (NITROSTAT) 0.4 MG SL tablet Place 0.4 mg under the tongue every 5 (five) minutes x 3 doses as needed for chest pain.     olmesartan (BENICAR) 20 MG tablet TAKE ONE TABLET BY MOUTH DAILY 90 tablet 2   pantoprazole (PROTONIX) 40 MG tablet TAKE 1 TABLET BY MOUTH TWICE DAILY. 180 tablet 3   potassium chloride SA (KLOR-CON) 20 MEQ tablet Take 1 tablet (20 mEq total) by mouth daily. Please keep upcoming appt in June 2022 with Dr. Tamala Julian before anymore refills. Thank you Final Attempt 90 tablet 3   XARELTO 20 MG TABS tablet TAKE 1 TABLET ONCE DAILY WITH DINNER. 60 tablet 5   No current facility-administered medications for this visit.    Allergies  Allergen Reactions   Metformin And Related Nausea And Vomiting and Other (See Comments)    Dizziness and disorientation, too    Social History   Socioeconomic History   Marital status: Married    Spouse name: Not on file   Number of children: 2   Years of education: Not on file   Highest education level: Not on file  Occupational History   Occupation: Hotel manager    Employer: OTHER  Tobacco Use   Smoking status: Never   Smokeless tobacco: Never  Vaping Use   Vaping Use: Never used  Substance and Sexual Activity    Alcohol use: Yes    Alcohol/week: 9.0 - 12.0 standard drinks    Types: 7 - 10 Glasses of wine, 2 Standard drinks or equivalent per week   Drug use: No   Sexual activity: Not on file  Other Topics Concern   Not on file  Social History Narrative   Caffeine: yes, coffee 2-3 cups daily, tea-rare.    Exercise-yes, 2-3X weekly, jog/run.    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    Marital Status: Married.    Children: 2 children-grown               Social Determinants of  Health   Financial Resource Strain: Not on file  Food Insecurity: Not on file  Transportation Needs: Not on file  Physical Activity: Not on file  Stress: Not on file  Social Connections: Not on file  Intimate Partner Violence: Not on file     Review of Systems: All other systems reviewed and are otherwise negative except as noted above.  Physical Exam: There were no vitals filed for this visit.  GEN- The patient is well appearing, alert and oriented x 3 today.   HEENT: normocephalic, atraumatic; sclera clear, conjunctiva pink; hearing intact; oropharynx clear; neck supple, no JVP Lymph- no cervical lymphadenopathy Lungs- Clear to ausculation bilaterally, normal work of breathing.  No wheezes, rales, rhonchi Heart- Regular rate and rhythm, no murmurs, rubs or gallops, PMI not laterally displaced GI- soft, non-tender, non-distended, bowel sounds present, no hepatosplenomegaly Extremities- no clubbing, cyanosis, or edema; DP/PT/radial pulses 2+ bilaterally MS- no significant deformity or atrophy Skin- warm and dry, no rash or lesion Psych- euthymic mood, full affect Neuro- strength and sensation are intact  EKG is ordered. Personal review of EKG from today shows NSR at 66 pm. QTc stable at ~ 450 ms  Additional studies reviewed include: Previous EP office notes.   Assessment and Plan:  1. Paroxysmal atrial fibrillation (HCC) EKG today shows  stable QTc on tikosyn Maintaining sinus rhythm after ablation procedure.   Continue Xarelto for stroke prophylaxis.   2. HTN Stable on current regimen    3. Encounter for long-term (current) use of high-risk medication, tikosyn QTC stable today    BMET/Mg today  Follow up with Dr. Quentin Ore in  4-6 months    Shirley Friar, PA-C  02/17/21 2:00 PM

## 2021-02-18 ENCOUNTER — Ambulatory Visit: Payer: BC Managed Care – PPO | Admitting: Student

## 2021-02-18 ENCOUNTER — Other Ambulatory Visit: Payer: Self-pay

## 2021-02-18 ENCOUNTER — Encounter: Payer: Self-pay | Admitting: Student

## 2021-02-18 ENCOUNTER — Other Ambulatory Visit: Payer: BC Managed Care – PPO

## 2021-02-18 VITALS — BP 128/80 | HR 66 | Ht 73.0 in | Wt 226.8 lb

## 2021-02-18 DIAGNOSIS — I10 Essential (primary) hypertension: Secondary | ICD-10-CM | POA: Diagnosis not present

## 2021-02-18 DIAGNOSIS — I480 Paroxysmal atrial fibrillation: Secondary | ICD-10-CM | POA: Diagnosis not present

## 2021-02-18 DIAGNOSIS — Z79899 Other long term (current) drug therapy: Secondary | ICD-10-CM

## 2021-02-18 LAB — BASIC METABOLIC PANEL
BUN/Creatinine Ratio: 18 (ref 10–24)
BUN: 20 mg/dL (ref 8–27)
CO2: 23 mmol/L (ref 20–29)
Calcium: 9.7 mg/dL (ref 8.6–10.2)
Chloride: 103 mmol/L (ref 96–106)
Creatinine, Ser: 1.13 mg/dL (ref 0.76–1.27)
Glucose: 144 mg/dL — ABNORMAL HIGH (ref 70–99)
Potassium: 4.5 mmol/L (ref 3.5–5.2)
Sodium: 139 mmol/L (ref 134–144)
eGFR: 71 mL/min/{1.73_m2} (ref >59)

## 2021-02-18 LAB — MAGNESIUM: Magnesium: 1.9 mg/dL (ref 1.6–2.3)

## 2021-02-18 MED ORDER — NITROGLYCERIN 0.4 MG SL SUBL: 0.4000 mg | SUBLINGUAL_TABLET | SUBLINGUAL | 3 refills | Status: AC | PRN

## 2021-02-18 NOTE — Patient Instructions (Signed)
Medication Instructions:  Your physician recommends that you continue on your current medications as directed. Please refer to the Current Medication list given to you today.  *If you need a refill on your cardiac medications before your next appointment, please call your pharmacy*   Lab Work: TODAY: BMET, Mag  If you have labs (blood work) drawn today and your tests are completely normal, you will receive your results only by: [place] (if you have MyChart) OR A paper copy in the mail If you have any lab test that is abnormal or we need to change your treatment, we will call you to review the results.   Follow-Up: At [facility], you and your health needs are our priority.  As part of our continuing mission to provide you with exceptional heart care, we have created designated Provider Care Teams.  These Care Teams include your primary Cardiologist (physician) and Advanced Practice Providers (APPs -  Physician Assistants and Nurse Practitioners) who all work together to provide you with the care you need, when you need it.   Your next appointment:   4-6 month(s)  The format for your next appointment:   In Person  Provider:   You may see [name], MD or one of the following Advanced Practice Providers on your designated Care Team:   Tommye Standard, [place] "[facility]" [place], [place]

## 2021-02-25 DIAGNOSIS — D225 Melanocytic nevi of trunk: Secondary | ICD-10-CM | POA: Diagnosis not present

## 2021-02-25 DIAGNOSIS — L570 Actinic keratosis: Secondary | ICD-10-CM | POA: Diagnosis not present

## 2021-02-25 DIAGNOSIS — L814 Other melanin hyperpigmentation: Secondary | ICD-10-CM | POA: Diagnosis not present

## 2021-02-25 DIAGNOSIS — L905 Scar conditions and fibrosis of skin: Secondary | ICD-10-CM | POA: Diagnosis not present

## 2021-02-25 DIAGNOSIS — L821 Other seborrheic keratosis: Secondary | ICD-10-CM | POA: Diagnosis not present

## 2021-06-29 ENCOUNTER — Other Ambulatory Visit: Payer: Self-pay | Admitting: Internal Medicine

## 2021-07-08 DIAGNOSIS — I10 Essential (primary) hypertension: Secondary | ICD-10-CM | POA: Diagnosis not present

## 2021-07-08 DIAGNOSIS — I4891 Unspecified atrial fibrillation: Secondary | ICD-10-CM | POA: Diagnosis not present

## 2021-07-08 DIAGNOSIS — Z0000 Encounter for general adult medical examination without abnormal findings: Secondary | ICD-10-CM | POA: Diagnosis not present

## 2021-07-08 DIAGNOSIS — E785 Hyperlipidemia, unspecified: Secondary | ICD-10-CM | POA: Diagnosis not present

## 2021-07-08 DIAGNOSIS — Z125 Encounter for screening for malignant neoplasm of prostate: Secondary | ICD-10-CM | POA: Diagnosis not present

## 2021-07-08 DIAGNOSIS — E1169 Type 2 diabetes mellitus with other specified complication: Secondary | ICD-10-CM | POA: Diagnosis not present

## 2021-07-08 DIAGNOSIS — K219 Gastro-esophageal reflux disease without esophagitis: Secondary | ICD-10-CM | POA: Diagnosis not present

## 2021-07-22 NOTE — Progress Notes (Signed)
?Electrophysiology Office Follow up Visit Note:   ? ?Date:  07/23/2021  ? ?ID:  [name], DOB May 24, 1950, MRN [id number] ? ?PCP:  Carlos Pepper, MD  ?[facility] Cardiologist:  Carlos Grooms, MD  ?[place] Electrophysiologist:  Carlos Epley, MD  ? ? ?Interval History:   ? ?Carlos Gutierrez is a 71 y.o. male who presents for a follow up visit. They were last seen in clinic 10/17/2020. Prior Afib ablation on 07/18/2020. ? ?Since their last appointment, he followed up with Carlos Ellison, PA-C on 02/18/2021 where he reported doing well. EKG showed NSR at 66 bpm, Qtc stable at ~450 ms on tikosyn. ? ?Today, I offered my condolences for his wife who passed away last week.  ? ?At this time he denies any recurrent arrhythmic episodes. ? ?For exercise he works out routinely (15 min warm up, 20 min working with a trainer, 30+ minutes of cardio), without any exertional symptoms. However, he notices occasional central chest discomfort and discomfort in his left shoulder. This usually occurs at rest while he is sitting. He would not necessarily describe this as pain, but he is concerned given his stress. ? ?He denies any palpitations, shortness of breath, or peripheral edema. No lightheadedness, headaches, syncope, orthopnea, or PND. ? ? ?  ? ?Past Medical History:  ?Diagnosis Date  ? ACL tear   ? RIGHT KNEE  ? Atrial fibrillation ([place]) 2012  ? Rare occurrences. On ASA & Flecainide prn  ? Atrial flutter ([place])   ? s/p ablation 2008. On Flecainide prn.  ? Broken leg 1968  ? Concussion 1966  ? History of 4  ? Diabetes mellitus without complication ([place])   ? DVT (deep venous thrombosis) ([place])   ? LEFT LOWER LEG  ? Dyslipidemia   ? Essential hypertension, benign   ? Insomnia   ? Low back pain   ? Obesity (BMI 30-39.9)   ? Obstructive sleep apnea on CPAP   ? Mild  ? PAF (paroxysmal atrial fibrillation) ([place])   ? Rare occurrences. On ASA & Flecainide prn  ? Pure hypercholesterolemia   ? On Lipitor   ? Right shoulder  pain   ? Intermittent  ? Status post ablation of atrial flutter 2008  ? Umbilical hernia   ? ? ?Past Surgical History:  ?Procedure Laterality Date  ? ablation for atrial flutter  01/2002  ? ATRIAL FIBRILLATION ABLATION N/A 07/18/2020  ? Procedure: ATRIAL FIBRILLATION ABLATION;  Surgeon: Carlos Epley, MD;  Location: [place] [facility];  Service: Cardiovascular;  Laterality: N/A;  ? CARDIAC CATHETERIZATION  12/26/01  ? CARDIAC CATHETERIZATION  04/18/2019  ? COLONOSCOPY WITH PROPOFOL N/A 03/12/2016  ? Procedure: COLONOSCOPY WITH PROPOFOL;  Surgeon: Carlos Ada, MD;  Location: [facility];  Service: Endoscopy;  Laterality: N/A;  ? COLONOSCOPY WITH PROPOFOL N/A 05/05/2019  ? Procedure: COLONOSCOPY WITH PROPOFOL;  Surgeon: Carlos Ada, MD;  Location: [place];  Service: Endoscopy;  Laterality: N/A;  ? coronary angiography  1997  ? 40% stenosis  ? ESOPHAGOGASTRODUODENOSCOPY (EGD) WITH PROPOFOL N/A 10/15/2016  ? Procedure: ESOPHAGOGASTRODUODENOSCOPY (EGD) WITH PROPOFOL;  Surgeon: Carlos Ada, MD;  Location: [facility];  Service: Endoscopy;  Laterality: N/A;  ? ESOPHAGOGASTRODUODENOSCOPY (EGD) WITH PROPOFOL N/A 12/14/2019  ? Procedure: ESOPHAGOGASTRODUODENOSCOPY (EGD) WITH PROPOFOL;  Surgeon: Carlos Ada, MD;  Location: [facility];  Service: Endoscopy;  Laterality: N/A;  ? LEFT HEART CATH AND CORONARY ANGIOGRAPHY N/A 02/11/2017  ? Procedure: LEFT HEART CATH AND CORONARY ANGIOGRAPHY;  Surgeon:  Carlos Crome, MD;  Location: [place] [facility];  Service: Cardiovascular;  Laterality: N/A;  ? LEFT HEART CATH AND CORONARY ANGIOGRAPHY N/A 04/18/2019  ? Procedure: LEFT HEART CATH AND CORONARY ANGIOGRAPHY;  Surgeon: Carlos Crome, MD;  Location: [place] [facility];  Service: Cardiovascular;  Laterality: N/A;  ? POLYPECTOMY  05/05/2019  ? Procedure: POLYPECTOMY;  Surgeon: Carlos Ada, MD;  Location: [facility];  Service: Endoscopy;;  ? right knee surgery  1996  ? ACL, meniscus problems  ? UMBILICAL HERNIA REPAIR   04/2015  ? ? ?Current Medications: ?Current Meds  ?Medication Sig  ? acetaminophen (TYLENOL) 500 MG tablet Take 1,000 mg by mouth as needed for mild pain, moderate pain or headache.   ? Ascorbic Acid (VITAMIN C) 1000 MG tablet Take 1,000 mg by mouth in the morning.  ? aspirin EC 81 MG tablet Take 1 tablet (81 mg total) by mouth daily.  ? atorvastatin (LIPITOR) 80 MG tablet Take 1 tablet (80 mg total) by mouth daily at 6 PM. Please keep upcoming appt in June 2022 with Dr. Tamala Julian before anymore refills. Thank you Final Attempt  ? cetirizine (ZYRTEC) 10 MG tablet Take 10 mg by mouth at bedtime as needed for allergies.  ? Cholecalciferol (VITAMIN D3) 50 MCG (2000 UT) TABS Take 2,000 Units by mouth in the morning.  ? clindamycin (CLEOCIN T) 1 % SWAB Apply 1 application topically 2 (two) times daily.  ? glipiZIDE (GLUCOTROL XL) 2.5 MG 24 hr tablet Take 2.5 mg by mouth daily with breakfast.  ? loratadine (CLARITIN) 10 MG tablet Take 10 mg by mouth daily as needed for allergies.  ? melatonin 5 MG TABS Take 5 mg by mouth at bedtime.  ? Multiple Vitamin (MULTIVITAMIN WITH MINERALS) TABS tablet Take 1 tablet by mouth in the morning.  ? Multiple Vitamins-Minerals (EYE VITAMINS PO) Take 1 tablet by mouth in the morning and at bedtime. Focus Select Zinc Free (  ? nitroGLYCERIN (NITROSTAT) 0.4 MG SL tablet Place 1 tablet (0.4 mg total) under the tongue every 5 (five) minutes x 3 doses as needed for chest pain.  ? olmesartan (BENICAR) 20 MG tablet TAKE ONE TABLET BY MOUTH DAILY  ? pantoprazole (PROTONIX) 40 MG tablet TAKE 1 TABLET BY MOUTH TWICE DAILY.  ? potassium chloride SA (KLOR-CON) 20 MEQ tablet Take 1 tablet (20 mEq total) by mouth daily. Please keep upcoming appt in June 2022 with Dr. Tamala Julian before anymore refills. Thank you Final Attempt  ? XARELTO 20 MG TABS tablet TAKE 1 TABLET ONCE DAILY WITH DINNER.  ? [DISCONTINUED] dofetilide (TIKOSYN) 500 MCG capsule TAKE (1) CAPSULE TWICE DAILY.  ?  ? ?Allergies:   Metformin and  related  ? ?Social History  ? ?Socioeconomic History  ? Marital status: Married  ?  Spouse name: Not on file  ? Number of children: 2  ? Years of education: Not on file  ? Highest education level: Not on file  ?Occupational History  ? Occupation: Hotel manager  ?  Employer: OTHER  ?Tobacco Use  ? Smoking status: Never  ? Smokeless tobacco: Never  ?Vaping Use  ? Vaping Use: Never used  ?Substance and Sexual Activity  ? Alcohol use: Yes  ?  Alcohol/week: 9.0 - 12.0 standard drinks  ?  Types: 7 - 10 Glasses of wine, 2 Standard drinks or equivalent per week  ? Drug use: No  ? Sexual activity: Not on file  ?Other Topics Concern  ? Not on file  ?  Social History Narrative  ? Caffeine: yes, coffee 2-3 cups daily, tea-rare.   ? Exercise-yes, 2-3X weekly, jog/run.   ? 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   ? Marital Status: Married.   ? Children: 2 children-grown     ?   ?   ?   ? ?Social Determinants of Health  ? ?Financial Resource Strain: Not on file  ?Food Insecurity: Not on file  ?Transportation Needs: Not on file  ?Physical Activity: Not on file  ?Stress: Not on file  ?Social Connections: Not on file  ?  ? ?Family History: ?The patient's family history includes COPD in his mother; Colon cancer (age of onset: 25) in his maternal grandfather; Diabetes in his mother; Diabetes (age of onset: 78) in his brother; Heart attack in his father and paternal grandfather; Heart disease in his mother; Heart disease (age of onset: 66) in his father; Heart failure in his father; Hypertension in his father; Hypertension (age of onset: 3) in his brother; Hypertension (age of onset: 52) in his mother. There is no history of Stroke. ? ?ROS:   ?Please see the history of present illness.    ?(+) Stress ?(+) Chest pain ?(+) Left shoulder pain ?All other systems reviewed and are negative. ? ?EKGs/Labs/Other Studies Reviewed:   ? ?The following studies were reviewed  today: ? ?08/05/20  Atrial Fibrillation Ablation ?CONCLUSIONS: ?1. Successful PVI ?2. CTI block, evidence of prior CTI ablation ?3. Intracardiac echo reveals normal LA architecture and trivial pericardial effusion

## 2021-07-23 ENCOUNTER — Ambulatory Visit: Payer: BC Managed Care – PPO | Admitting: Cardiology

## 2021-07-23 ENCOUNTER — Encounter: Payer: Self-pay | Admitting: Cardiology

## 2021-07-23 VITALS — BP 132/82 | HR 68 | Ht 73.0 in | Wt 230.0 lb

## 2021-07-23 DIAGNOSIS — G4733 Obstructive sleep apnea (adult) (pediatric): Secondary | ICD-10-CM | POA: Diagnosis not present

## 2021-07-23 DIAGNOSIS — I4819 Other persistent atrial fibrillation: Secondary | ICD-10-CM | POA: Diagnosis not present

## 2021-07-23 DIAGNOSIS — I209 Angina pectoris, unspecified: Secondary | ICD-10-CM

## 2021-07-23 DIAGNOSIS — I4892 Unspecified atrial flutter: Secondary | ICD-10-CM

## 2021-07-23 DIAGNOSIS — I10 Essential (primary) hypertension: Secondary | ICD-10-CM

## 2021-07-23 DIAGNOSIS — Z79899 Other long term (current) drug therapy: Secondary | ICD-10-CM

## 2021-07-23 NOTE — Patient Instructions (Addendum)
Medication Instructions:  ?Stop Tikosyn ?Your physician recommends that you continue on your current medications as directed. Please refer to the Current Medication list given to you today. ?*If you need a refill on your cardiac medications before your next appointment, please call your pharmacy* ? ?Lab Work: ?None. ?If you have labs (blood work) drawn today and your tests are completely normal, you will receive your results only by: ?MyChart Message (if you have MyChart) OR ?A paper copy in the mail ?If you have any lab test that is abnormal or we need to change your treatment, we will call you to review the results. ? ?Testing/Procedures: ?None. ? ?Follow-Up: ?At [facility], you and your health needs are our priority.  As part of our continuing mission to provide you with exceptional heart care, we have created designated Provider Care Teams.  These Care Teams include your primary Cardiologist (physician) and Advanced Practice Providers (APPs -  Physician Assistants and Nurse Practitioners) who all work together to provide you with the care you need, when you need it. ? ?Your physician wants you to follow-up in: 6 months with Lars Mage, MD  ? ?We recommend signing up for the patient portal called "MyChart".  Sign up information is provided on this After Visit Summary.  MyChart is used to connect with patients for Virtual Visits (Telemedicine).  Patients are able to view lab/test results, encounter notes, upcoming appointments, etc.  Non-urgent messages can be sent to your provider as well.   ?To learn more about what you can do with MyChart, go to [email or web address].   ? ?Any Other Special Instructions Will Be Listed Below (If Applicable). ? ? ? ? ?  ? ? ?

## 2021-07-27 ENCOUNTER — Telehealth (HOSPITAL_COMMUNITY): Payer: Self-pay | Admitting: *Deleted

## 2021-07-27 ENCOUNTER — Telehealth: Payer: Self-pay

## 2021-07-27 DIAGNOSIS — R079 Chest pain, unspecified: Secondary | ICD-10-CM

## 2021-07-27 NOTE — Telephone Encounter (Signed)
Reaching out to patient to offer assistance regarding upcoming cardiac imaging study; pt verbalizes understanding of appt date/time, parking situation and where to check in, pre-test NPO status and verified current allergies; name and call back number provided for further questions should they arise ? ?Gordy Clement RN Navigator Cardiac Imaging ?[facility] ?[phone] office ?[phone] cell ? ?Patient aware to avoid caffeine after 11:30pm this evening. ?

## 2021-07-27 NOTE — Telephone Encounter (Signed)
-----   Message from Belva Crome, MD sent at 07/24/2021  4:59 PM EDT ----- ?Regarding: Chest discomfort ?[name] ?Please get [name] set to have a stress PET scan then set up for OV with me shortly thereafter. If the wait is longer than 10 days, change to [employer]. ?----- Message ----- ?From: [name], MD ?Sent: 07/23/2021   9:04 AM EDT ?To: Belva Crome, MD ? ?Hey [name], ? ?I saw Mr. [name] today in clinic.  He recently lost his wife after a long battle with cancer.  During our visit he told me he has been having some chest discomfort which is centrally located and radiates to the left shoulder.  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  He was initially medically managed.  After he told me his symptoms, I was initially going to order a PET stress but wanted to see what your thoughts were first.  Did not know if he is a guy that you would take straight for cath based on his prior angiography. ? ?Would you mind taking a look?  I am happy to help get things ordered for him. ? ?Thanks, ? ?Lysbeth Galas ? ? ?

## 2021-07-27 NOTE — Telephone Encounter (Signed)
Spoke with patient regarding Dr. Thompson Caul recommendation to have a Cardiac PET scan. ? ?Patient is agreeable. Cardiac PET scan ordered, staff message sent to [facility] Cardiac PET pool for authorization/scheduling. Attestation pended for MD signature. ? ?Instructions reviewed with patient and sent via MyChart message. ?

## 2021-07-28 ENCOUNTER — Encounter (HOSPITAL_COMMUNITY)
Admission: RE | Admit: 2021-07-28 | Discharge: 2021-07-28 | Disposition: A | Discharge status: Home / Self Care | Payer: BC Managed Care – PPO | Source: Ambulatory Visit | Attending: Interventional Cardiology | Admitting: Interventional Cardiology

## 2021-07-28 DIAGNOSIS — R079 Chest pain, unspecified: Secondary | ICD-10-CM | POA: Diagnosis not present

## 2021-07-28 LAB — NM PET CT CARDIAC PERFUSION MULTI W/ABSOLUTE BLOODFLOW
LV dias vol: 143 mL (ref 62–150)
LV sys vol: 71 mL
MBFR: 2.3
Nuc Rest EF: 50 %
Nuc Stress EF: 50 %
Peak HR: 70 {beats}/min
Rest HR: 66 {beats}/min
Rest MBF: 0.7 ml/g/min
Rest Nuclear Isotope Dose: 25.3 mCi
Rest perfusion cavity size (mL): 141 mL
ST Depression (mm): 0 mm
Stress MBF: 1.61 ml/g/min
Stress Nuclear Isotope Dose: 24.7 mCi
Stress perfusion cavity size (mL): 143 mL
TID: 1.01

## 2021-07-28 MED ORDER — RUBIDIUM RB82 GENERATOR (RUBYFILL)
25.3000 | PACK | Freq: Once | INTRAVENOUS | Status: AC
  Administered 2021-07-28: 25.3 via INTRAVENOUS

## 2021-07-28 MED ORDER — RUBIDIUM RB82 GENERATOR (RUBYFILL)
24.6800 | PACK | Freq: Once | INTRAVENOUS | Status: AC
  Administered 2021-07-28: 24.68 via INTRAVENOUS

## 2021-07-28 MED ORDER — REGADENOSON 0.4 MG/5ML IV SOLN
INTRAVENOUS | Status: AC
  Administered 2021-07-28: 0.4 mg
  Filled 2021-07-28: qty 5

## 2021-07-30 ENCOUNTER — Telehealth: Payer: Self-pay

## 2021-07-30 NOTE — Telephone Encounter (Signed)
Spoke with patient to discuss results of PET scan.  Per Dr. Tamala Julian: Let the patient know the patient know that the nuclear medicine PET/CT perfusion study is normal.  Continue conservative risk factor modification.  Let me know if symptoms progress.  Only thing from here would be to do coronary angiography but that seems unnecessary with such a normal appearing test.  Patient reports his chest pain has not been debilitating, but it is present more at rest than with exertion. He states a couple weeks ago in the evening he had chest pain at rest, relieved after 1 dose of nitroglycerin. He has recently lost his wife and acknowledges that stress can be a factor in his CP.  Patient also referred to coronary angiography performed in 2021, he states he is concerned with the percentages in the report for his coronary arteries. He would like for Dr. Tamala Julian to review this and consider performing another coronary angiography. Patient states "I don't want to overreact to this, but I also don't just want to let it go and have a widowmaker."  Will forward to Dr. Tamala Julian to review.

## 2021-07-30 NOTE — Telephone Encounter (Signed)
-----   Message from Belva Crome, MD sent at 07/29/2021 10:03 AM EDT ----- Let the patient know the patient know that the nuclear medicine PET/CT perfusion study is normal.  Continue conservative risk factor modification.  Let me know if symptoms progress.  Only thing from here would be to do coronary angiography but that seems unnecessary with such a normal appearing test. A copy will be sent to Stacie Glaze, DO

## 2021-08-18 DIAGNOSIS — R945 Abnormal results of liver function studies: Secondary | ICD-10-CM | POA: Diagnosis not present

## 2021-08-27 ENCOUNTER — Other Ambulatory Visit: Payer: Self-pay | Admitting: Interventional Cardiology

## 2021-09-02 ENCOUNTER — Other Ambulatory Visit: Payer: Self-pay | Admitting: Interventional Cardiology

## 2021-09-21 ENCOUNTER — Other Ambulatory Visit: Payer: Self-pay | Admitting: *Deleted

## 2021-09-21 DIAGNOSIS — I4819 Other persistent atrial fibrillation: Secondary | ICD-10-CM

## 2021-09-21 MED ORDER — RIVAROXABAN 20 MG PO TABS: ORAL_TABLET | ORAL | 5 refills | Status: DC

## 2021-09-21 NOTE — Telephone Encounter (Signed)
Xarelto '20mg'$  refill request received. Pt is 71 years old, weight-104.3kg, Crea-1.13 on 02/18/2021, last seen by Dr. Quentin Ore on 07/23/2021, Diagnosis-Afib, CrCl-91.20m/min; Dose is appropriate based on dosing criteria. Will send in refill to requested pharmacy.

## 2021-09-24 ENCOUNTER — Encounter: Payer: Self-pay | Admitting: Physician Assistant

## 2021-09-24 ENCOUNTER — Ambulatory Visit: Payer: BC Managed Care – PPO | Admitting: Physician Assistant

## 2021-09-24 VITALS — BP 140/88 | HR 71 | Ht 73.0 in | Wt 234.0 lb

## 2021-09-24 DIAGNOSIS — I10 Essential (primary) hypertension: Secondary | ICD-10-CM

## 2021-09-24 DIAGNOSIS — I4819 Other persistent atrial fibrillation: Secondary | ICD-10-CM | POA: Diagnosis not present

## 2021-09-24 DIAGNOSIS — I480 Paroxysmal atrial fibrillation: Secondary | ICD-10-CM

## 2021-09-24 DIAGNOSIS — I25118 Atherosclerotic heart disease of native coronary artery with other forms of angina pectoris: Secondary | ICD-10-CM | POA: Diagnosis not present

## 2021-09-24 DIAGNOSIS — E785 Hyperlipidemia, unspecified: Secondary | ICD-10-CM

## 2021-09-24 MED ORDER — RIVAROXABAN 20 MG PO TABS: ORAL_TABLET | ORAL | 1 refills | Status: DC

## 2021-09-24 MED ORDER — OLMESARTAN MEDOXOMIL 20 MG PO TABS: 20.0000 mg | ORAL_TABLET | Freq: Every day | ORAL | 2 refills | Status: DC

## 2021-09-24 MED ORDER — PANTOPRAZOLE SODIUM 40 MG PO TBEC: 40.0000 mg | DELAYED_RELEASE_TABLET | Freq: Two times a day (BID) | ORAL | 2 refills | Status: DC

## 2021-09-24 MED ORDER — ATORVASTATIN CALCIUM 80 MG PO TABS: 80.0000 mg | ORAL_TABLET | Freq: Every day | ORAL | 2 refills | Status: DC

## 2021-09-24 MED ORDER — POTASSIUM CHLORIDE CRYS ER 20 MEQ PO TBCR: 20.0000 meq | EXTENDED_RELEASE_TABLET | Freq: Every day | ORAL | 2 refills | Status: DC

## 2021-09-24 NOTE — Patient Instructions (Signed)
Medication Instructions:  Your physician recommends that you continue on your current medications as directed. Please refer to the Current Medication list given to you today. *If you need a refill on your cardiac medications before your next appointment, please call your pharmacy*   Lab Work: None Ordered   Testing/Procedures: None Ordered   Follow-Up: At [facility], you and your health needs are our priority.  As part of our continuing mission to provide you with exceptional heart care, we have created designated Provider Care Teams.  These Care Teams include your primary Cardiologist (physician) and Advanced Practice Providers (APPs -  Physician Assistants and Nurse Practitioners) who all work together to provide you with the care you need, when you need it.  We recommend signing up for the patient portal called "MyChart".  Sign up information is provided on this After Visit Summary.  MyChart is used to connect with patients for Virtual Visits (Telemedicine).  Patients are able to view lab/test results, encounter notes, upcoming appointments, etc.  Non-urgent messages can be sent to your provider as well.   To learn more about what you can do with MyChart, go to [email or web address].    Your next appointment:   4 month(s)  The format for your next appointment:   In Person  Provider:   Sinclair Grooms, MD     Other Instructions   Important Information About Sugar

## 2021-09-24 NOTE — Progress Notes (Signed)
Cardiology Office Note:    Date:  09/24/2021   ID:  Valdis, Bevill 10-08-1950, MRN [id number]  PCP:  London Pepper, MD  [facility] Cardiologist:  Sinclair Grooms, MD  [facility] Electrophysiologist:  [name], MD   Chief Complaint: yearly follow up   History of Present Illness:    Carlos Gutierrez is a 71 y.o. male with a hx of CAD (moderate non-obstructive), HTN, HLD, DM, AFlutter (ablated 2003), AFib (ablated 07/18/20), DVT, and OSA w/CPAP presents for followed up.   Last cath 04/2019: 40% LM, 50% pLAD, 65% Ramus, 60% 1st OM, 40% pRCA, 70% dRCA>> Med Rx.  Last seem by Dr. Quentin Ore 07/2021. Stopped Tikosyn as he was maintaining sinus rhythm since ablation. He was having chest pain.   Follow up PET CT cardiac perfusion 07/28/21: The study is normal. The study is low risk.   LV perfusion is normal. There is no evidence of ischemia. There is no evidence of infarction.   Rest left ventricular function is normal. Rest EF: 50 %. Stress left ventricular function is normal. Stress EF: 50 %. End diastolic cavity size is mildly enlarged. End systolic cavity size is normal.   Myocardial blood flow was computed to be 0.12m/g/min at rest and 1.667mg/min at stress. Global myocardial blood flow reserve was 2.30 and was normal.   Coronary calcium was present on the attenuation correction CT images. Moderate coronary calcifications were present. Coronary calcifications were present in the left anterior descending artery, left circumflex artery and right coronary artery distribution(s).  Here today for follow up.  Recently was having resting chest pain while saw Dr. LaQuentin Ore At that time, he was grieving from his wife loss.  She was battling with brain cancer for few years.  He did not had any exertional chest pain.  He does weight training and walking/running without any problem.  Since then, his symptoms has improved significantly.  Reassuring stress test as above.  Denies chest  pain, shortness of breath, orthopnea, PND, syncope, lower extremity edema or melena.  Only required 3 sublingual nitroglycerin in the past 6 months.   Past Medical History:  Diagnosis Date   ACL tear    RIGHT KNEE   Atrial fibrillation ([facility]   Rare occurrences. On ASA & Flecainide prn   Atrial flutter ([facility]   s/p ablation 2008. On Flecainide prn.   Broken leg 1968   Concussion 1966   History of 4   Diabetes mellitus without complication ([facility]   DVT (deep venous thrombosis) (H[facility])    LEFT LOWER LEG   Dyslipidemia    Essential hypertension, benign    Insomnia    Low back pain    Obesity (BMI 30-39.9)    Obstructive sleep apnea on CPAP    Mild   PAF (paroxysmal atrial fibrillation) ([facility]   Rare occurrences. On ASA & Flecainide prn   Pure hypercholesterolemia    On Lipitor    Right shoulder pain    Intermittent   Status post ablation of atrial flutter [id number] Umbilical hernia     Past Surgical History:  Procedure Laterality Date   ablation for atrial flutter  01/2002   ATRIAL FIBRILLATION ABLATION N/A 07/18/2020   Procedure: ATRIAL FIBRILLATION ABLATION;  Surgeon: LaVickie EpleyMD;  [facility];  Service: Cardiovascular;  Laterality: N/A;   CARDIAC CATHETERIZATION  12/26/01   CARDIAC CATHETERIZATION  04/18/2019   COLONOSCOPY WITH PROPOFOL N/A  03/12/2016   Procedure: COLONOSCOPY WITH PROPOFOL;  Surgeon: [name], MD;  [facility]: [facility];  Service: Endoscopy;  Laterality: N/A;   COLONOSCOPY WITH PROPOFOL N/A 05/05/2019   Procedure: COLONOSCOPY WITH PROPOFOL;  Surgeon: [name], MD;  Location: [facility];  Service: Endoscopy;  Laterality: N/A;   coronary angiography  1997   40% stenosis   ESOPHAGOGASTRODUODENOSCOPY (EGD) WITH PROPOFOL N/A 10/15/2016   Procedure: ESOPHAGOGASTRODUODENOSCOPY (EGD) WITH PROPOFOL;  Surgeon: [name], MD;  [facility];  Service: Endoscopy;  Laterality: N/A;   ESOPHAGOGASTRODUODENOSCOPY (EGD)  WITH PROPOFOL N/A 12/14/2019   Procedure: ESOPHAGOGASTRODUODENOSCOPY (EGD) WITH PROPOFOL;  Surgeon: [name], MD;  [facility];  Service: Endoscopy;  Laterality: N/A;   LEFT HEART CATH AND CORONARY ANGIOGRAPHY N/A 02/11/2017   Procedure: LEFT HEART CATH AND CORONARY ANGIOGRAPHY;  Surgeon: Belva Crome, MD;  Location: [facility];  Service: Cardiovascular;  Laterality: N/A;   LEFT HEART CATH AND CORONARY ANGIOGRAPHY N/A 04/18/2019   Procedure: LEFT HEART CATH AND CORONARY ANGIOGRAPHY;  Surgeon: Belva Crome, MD;  Location: [place] [facility];  Service: Cardiovascular;  Laterality: N/A;   POLYPECTOMY  05/05/2019   Procedure: POLYPECTOMY;  Surgeon: [name], MD;  Location: [facility];  Service: Endoscopy;;   right knee surgery  1996   ACL, meniscus problems   UMBILICAL HERNIA REPAIR  04/2015    Current Medications: Current Meds  Medication Sig   acetaminophen (TYLENOL) 500 MG tablet Take 1,000 mg by mouth as needed for mild pain, moderate pain or headache.    Ascorbic Acid (VITAMIN C) 1000 MG tablet Take 1,000 mg by mouth in the morning.   aspirin EC 81 MG tablet Take 1 tablet (81 mg total) by mouth daily.   cetirizine (ZYRTEC) 10 MG tablet Take 10 mg by mouth at bedtime as needed for allergies.   Cholecalciferol (VITAMIN D3) 50 MCG (2000 UT) TABS Take 2,000 Units by mouth in the morning.   clindamycin (CLEOCIN T) 1 % SWAB Apply 1 application topically 2 (two) times daily.   glipiZIDE (GLUCOTROL XL) 2.5 MG 24 hr tablet Take 2.5 mg by mouth daily with breakfast.   loratadine (CLARITIN) 10 MG tablet Take 10 mg by mouth daily as needed for allergies.   melatonin 5 MG TABS Take 5 mg by mouth at bedtime.   Multiple Vitamin (MULTIVITAMIN WITH MINERALS) TABS tablet Take 1 tablet by mouth in the morning.   Multiple Vitamins-Minerals (EYE VITAMINS PO) Take 1 tablet by mouth in the morning and at bedtime. Focus Select Zinc Free (   nitroGLYCERIN (NITROSTAT) 0.4 MG SL  tablet Place 1 tablet (0.4 mg total) under the tongue every 5 (five) minutes x 3 doses as needed for chest pain.   [DISCONTINUED] atorvastatin (LIPITOR) 80 MG tablet Take 1 tablet (80 mg total) by mouth daily at 6 PM. Please keep upcoming appt in June 2022 with Dr. Tamala Julian before anymore refills. Thank you Final Attempt   [DISCONTINUED] olmesartan (BENICAR) 20 MG tablet TAKE ONE TABLET BY MOUTH DAILY   [DISCONTINUED] pantoprazole (PROTONIX) 40 MG tablet TAKE 1 TABLET BY MOUTH TWICE DAILY.   [DISCONTINUED] potassium chloride SA (KLOR-CON) 20 MEQ tablet Take 1 tablet (20 mEq total) by mouth daily. Please keep upcoming appt in June 2022 with Dr. Tamala Julian before anymore refills. Thank you Final Attempt   [DISCONTINUED] rivaroxaban (XARELTO) 20 MG TABS tablet TAKE 1 TABLET ONCE DAILY WITH DINNER.     Allergies:   Metformin and related  Social History   Socioeconomic History   Marital status: Married    Spouse name: Not on file   Number of children: 2   Years of education: Not on file   Highest education level: Not on file  Occupational History   Occupation: Hotel manager    Employer: OTHER  Tobacco Use   Smoking status: Never   Smokeless tobacco: Never  Vaping Use   Vaping Use: Never used  Substance and Sexual Activity   Alcohol use: Yes    Alcohol/week: 9.0 - 12.0 standard drinks of alcohol    Types: 7 - 10 Glasses of wine, 2 Standard drinks or equivalent per week   Drug use: No   Sexual activity: Not on file  Other Topics Concern   Not on file  Social History Narrative   Caffeine: yes, coffee 2-3 cups daily, tea-rare.    Exercise-yes, 2-3X weekly, jog/run.    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    Marital Status: Married.    Children: 2 children-grown               Social Determinants of Radio broadcast assistant Strain: Not on file  Food Insecurity: Not on file  Transportation Needs: Not on file   Physical Activity: Not on file  Stress: Not on file  Social Connections: Not on file     Family History: The patient's family history includes COPD in his mother; Colon cancer (age of onset: 66) in his maternal grandfather; Diabetes in his mother; Diabetes (age of onset: 1) in his brother; Heart attack in his father and paternal grandfather; Heart disease in his mother; Heart disease (age of onset: 72) in his father; Heart failure in his father; Hypertension in his father; Hypertension (age of onset: 45) in his brother; Hypertension (age of onset: 78) in his mother. There is no history of Stroke.    ROS:   Please see the history of present illness.    All other systems reviewed and are negative.   EKGs/Labs/Other Studies Reviewed:    The following studies were reviewed today:  LEFT HEART CATH AND CORONARY ANGIOGRAPHY  04/2019   Conclusion  30 to 40% distal left main, which is new/progressed from 2018. Luminal irregularities in the LAD mid and distal. First obtuse marginal 50% tandem proximal to mid stenoses. 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  Unchanged compared to prior images. Normal left ventricular function with EF 55%.  LVEDP normal. Atrial fibrillation developed during the night.  He is asymptomatic and did not know he was in atrial fib.   Recommendations: Continue aggressive risk factor modification.  Increase statin intensity to max dose atorvastatin, 80 mg/day. Consider adding antiarrhythmic therapy continuously to control atrial for rhythm.  Perhaps some of his symptoms are related to undetected atrial fibrillation. Diagnostic Dominance: Right   EKG:  EKG is not ordered today.    Recent Labs: 02/18/2021: BUN 20; Creatinine, Ser 1.13; Magnesium 1.9; Potassium 4.5; Sodium 139  Recent Lipid Panel    Component Value Date/Time   CHOL 133 10/05/2017 0906   TRIG 90 10/05/2017 0906   HDL 45 10/05/2017  0906   CHOLHDL 3.0 10/05/2017 0906   LDLCALC 70 10/05/2017 0906      Physical Exam:    VS:  BP 140/88   Pulse 71   Ht '6\' 1"'$  (1.854 m)   Wt 234 lb (106.1  kg)   SpO2 96%   BMI 30.87 kg/m     Wt Readings from Last 3 Encounters:  09/24/21 234 lb (106.1 kg)  07/23/21 230 lb (104.3 kg)  02/18/21 226 lb 12.8 oz (102.9 kg)     GEN:  Well nourished, well developed in no acute distress HEENT: Normal NECK: No JVD; No carotid bruits LYMPHATICS: No lymphadenopathy CARDIAC: RRR, no murmurs, rubs, gallops RESPIRATORY:  Clear to auscultation without rales, wheezing or rhonchi  ABDOMEN: Soft, non-tender, non-distended MUSCULOSKELETAL:  No edema; No deformity  SKIN: Warm and dry NEUROLOGIC:  Alert and oriented x 3 PSYCHIATRIC:  Normal affect   ASSESSMENT AND PLAN:    Moderate CAD -Cardiac cath in 2021 as above.  His chest pain in May could be related to stress while grieving from wife loss.  His symptoms has improved significantly.  Reassuring stress test as above. -Mutual agreement to watch symptoms.  He will continue to use as needed nitroglycerin.  If recurrent symptoms will be evaluated, likely to place antianginal therapy.  Currently he wants to defer and I agree. -Continue aspirin and statin  2.  Paroxysmal atrial fibrillation/flutter -Followed by Dr. Quentin Ore -Continue Xarelto  3.  Hypertension -Blood pressure stable on Benicar -Reports blood pressure running in 120/80 at home  4.  Hyperlipidemia -Continue statin    Medication Adjustments/Labs and Tests Ordered: Current medicines are reviewed at length with the patient today.  Concerns regarding medicines are outlined above.  No orders of the defined types were placed in this encounter.  Meds ordered this encounter  Medications   atorvastatin (LIPITOR) 80 MG tablet    Sig: Take 1 tablet (80 mg total) by mouth daily at 6 PM.    Dispense:  90 tablet    Refill:  2   olmesartan (BENICAR) 20 MG tablet    Sig: Take 1  tablet (20 mg total) by mouth daily.    Dispense:  90 tablet    Refill:  2   pantoprazole (PROTONIX) 40 MG tablet    Sig: Take 1 tablet (40 mg total) by mouth 2 (two) times daily.    Dispense:  180 tablet    Refill:  2   potassium chloride SA (KLOR-CON M) 20 MEQ tablet    Sig: Take 1 tablet (20 mEq total) by mouth daily.    Dispense:  90 tablet    Refill:  2   rivaroxaban (XARELTO) 20 MG TABS tablet    Sig: TAKE 1 TABLET ONCE DAILY WITH DINNER.    Dispense:  180 tablet    Refill:  1    Patient Instructions  Medication Instructions:  Your physician recommends that you continue on your current medications as directed. Please refer to the Current Medication list given to you today. *If you need a refill on your cardiac medications before your next appointment, please call your pharmacy*   Lab Work: None Ordered   Testing/Procedures: None Ordered   Follow-Up: At [facility], you and your health needs are our priority.  As part of our continuing mission to provide you with exceptional heart care, we have created designated Provider Care Teams.  These Care Teams include your primary Cardiologist (physician) and Advanced Practice Providers (APPs -  Physician Assistants and Nurse Practitioners) who all work together to provide you with the care you need, when you need it.  We recommend signing up for the patient portal called "MyChart".  Sign up information is provided on this After Visit Summary.  MyChart  is used to connect with patients for Virtual Visits (Telemedicine).  Patients are able to view lab/test results, encounter notes, upcoming appointments, etc.  Non-urgent messages can be sent to your provider as well.   To learn more about what you can do with MyChart, go to [email or web address].    Your next appointment:   4 month(s)  The format for your next appointment:   In Person  Provider:   Sinclair Grooms, MD     Other Instructions   Important Information  About Sugar         Jarrett Soho, [place]  09/24/2021 8:55 AM    [place]

## 2021-09-29 DIAGNOSIS — L570 Actinic keratosis: Secondary | ICD-10-CM | POA: Diagnosis not present

## 2021-09-29 DIAGNOSIS — L905 Scar conditions and fibrosis of skin: Secondary | ICD-10-CM | POA: Diagnosis not present

## 2021-09-29 DIAGNOSIS — L814 Other melanin hyperpigmentation: Secondary | ICD-10-CM | POA: Diagnosis not present

## 2021-09-29 DIAGNOSIS — D225 Melanocytic nevi of trunk: Secondary | ICD-10-CM | POA: Diagnosis not present

## 2021-09-29 DIAGNOSIS — L821 Other seborrheic keratosis: Secondary | ICD-10-CM | POA: Diagnosis not present

## 2022-01-07 DIAGNOSIS — E559 Vitamin D deficiency, unspecified: Secondary | ICD-10-CM | POA: Diagnosis not present

## 2022-01-07 DIAGNOSIS — I4891 Unspecified atrial fibrillation: Secondary | ICD-10-CM | POA: Diagnosis not present

## 2022-01-07 DIAGNOSIS — I10 Essential (primary) hypertension: Secondary | ICD-10-CM | POA: Diagnosis not present

## 2022-01-07 DIAGNOSIS — E785 Hyperlipidemia, unspecified: Secondary | ICD-10-CM | POA: Diagnosis not present

## 2022-01-07 DIAGNOSIS — Z86718 Personal history of other venous thrombosis and embolism: Secondary | ICD-10-CM | POA: Diagnosis not present

## 2022-01-07 DIAGNOSIS — E1169 Type 2 diabetes mellitus with other specified complication: Secondary | ICD-10-CM | POA: Diagnosis not present

## 2022-01-31 NOTE — Progress Notes (Unsigned)
Cardiology Office Note:    Date:  02/02/2022   ID:  [name], [name] [birth year], MRN [id number]  PCP:  London Pepper, MD  Cardiologist:  Sinclair Grooms, MD   Referring MD: London Pepper, MD   Chief Complaint  Patient presents with   Coronary Artery Disease   Atrial Fibrillation   Hypertension    History of Present Illness:    Carlos Gutierrez is a 70 y.o. male with a hx of CAD (non-obstructive), HTN, HLD, DM, AFlutter (ablated 2003), AFib (ablated 07/18/20 Quentin Ore), DVT, and OSA w/CPAP   On 09/24/2021 visit with Vin Bhagat: "Recently was having resting chest pain while saw Dr. Quentin Ore.  At that time, he was grieving from his wife loss.  She was battling with brain cancer for few years.  He did not had any exertional chest pain.  He does weight training and walking/running without any problem.  Since then, his symptoms has improved significantly.  Reassuring stress test as above.  Denies chest pain, shortness of breath, orthopnea, PND, syncope, lower extremity edema or melena.  Only required 3 sublingual nitroglycerin in the past 6 months."   Overall, doing well.  He has noted some variability in his blood pressure.  Blood pressure is elevated today.  No chest discomfort.  Continues to workout on a regular basis.  Denies orthopnea, PND, palpitations.  Has had no recurrent atrial flutter/fibrillation since ablation by Dr. Quentin Ore.  He has developed relationship with a male friend.  He inquires about whether sexual intercourse is contraindicated in his current cardiovascular state.  He also wonders about the use of PDE 5 inhibitor therapy.  I reassured the patient that using therapy for erectile dysfunction is not contraindicated in his current cardiac situation.  Past Medical History:  Diagnosis Date   ACL tear    RIGHT KNEE   Atrial fibrillation ([place]) 2012   Rare occurrences. On ASA & Flecainide prn   Atrial flutter ([place])    s/p ablation 2008. On Flecainide prn.   Broken leg  1968   Concussion 1966   History of 4   Diabetes mellitus without complication ([place])    DVT (deep venous thrombosis) (HCC)    LEFT LOWER LEG   Dyslipidemia    Essential hypertension, benign    Insomnia    Low back pain    Obesity (BMI 30-39.9)    Obstructive sleep apnea on CPAP    Mild   PAF (paroxysmal atrial fibrillation) ([place])    Rare occurrences. On ASA & Flecainide prn   Pure hypercholesterolemia    On Lipitor    Right shoulder pain    Intermittent   Status post ablation of atrial flutter 2947   Umbilical hernia     Past Surgical History:  Procedure Laterality Date   ablation for atrial flutter  01/2002   ATRIAL FIBRILLATION ABLATION N/A 07/18/2020   Procedure: ATRIAL FIBRILLATION ABLATION;  Surgeon: [name], MD;  Location: [place] [facility];  Service: Cardiovascular;  Laterality: N/A;   CARDIAC CATHETERIZATION  12/26/01   CARDIAC CATHETERIZATION  04/18/2019   COLONOSCOPY WITH PROPOFOL N/A 03/12/2016   Procedure: COLONOSCOPY WITH PROPOFOL;  Surgeon: [name], MD;  [facility];  Service: Endoscopy;  Laterality: N/A;   COLONOSCOPY WITH PROPOFOL N/A 05/05/2019   Procedure: COLONOSCOPY WITH PROPOFOL;  Surgeon: [name], MD;  Location: [place];  Service: Endoscopy;  Laterality: N/A;   coronary angiography  1997   40% stenosis   ESOPHAGOGASTRODUODENOSCOPY (EGD)  WITH PROPOFOL N/A 10/15/2016   Procedure: ESOPHAGOGASTRODUODENOSCOPY (EGD) WITH PROPOFOL;  Surgeon: [name], MD;  [facility];  Service: Endoscopy;  Laterality: N/A;   ESOPHAGOGASTRODUODENOSCOPY (EGD) WITH PROPOFOL N/A 12/14/2019   Procedure: ESOPHAGOGASTRODUODENOSCOPY (EGD) WITH PROPOFOL;  Surgeon: [name], MD;  [facility];  Service: Endoscopy;  Laterality: N/A;   LEFT HEART CATH AND CORONARY ANGIOGRAPHY N/A 02/11/2017   Procedure: LEFT HEART CATH AND CORONARY ANGIOGRAPHY;  Surgeon: Belva Crome, MD;  Location: [facility];  Service:  Cardiovascular;  Laterality: N/A;   LEFT HEART CATH AND CORONARY ANGIOGRAPHY N/A 04/18/2019   Procedure: LEFT HEART CATH AND CORONARY ANGIOGRAPHY;  Surgeon: Belva Crome, MD;  Location: [place] [facility];  Service: Cardiovascular;  Laterality: N/A;   POLYPECTOMY  05/05/2019   Procedure: POLYPECTOMY;  Surgeon: [name], MD;  Location: [place];  Service: Endoscopy;;   right knee surgery  1996   ACL, meniscus problems   UMBILICAL HERNIA REPAIR  04/2015    Current Medications: Current Meds  Medication Sig   acetaminophen (TYLENOL) 500 MG tablet Take 1,000 mg by mouth as needed for mild pain, moderate pain or headache.    Ascorbic Acid (VITAMIN C) 1000 MG tablet Take 1,000 mg by mouth in the morning.   aspirin EC 81 MG tablet Take 1 tablet (81 mg total) by mouth daily.   atorvastatin (LIPITOR) 80 MG tablet Take 1 tablet (80 mg total) by mouth daily at 6 PM.   cetirizine (ZYRTEC) 10 MG tablet Take 10 mg by mouth at bedtime as needed for allergies.   Cholecalciferol (VITAMIN D3) 50 MCG (2000 UT) TABS Take 2,000 Units by mouth in the morning.   clindamycin (CLEOCIN T) 1 % SWAB Apply 1 application topically 2 (two) times daily.   glipiZIDE (GLUCOTROL XL) 2.5 MG 24 hr tablet Take 2.5 mg by mouth daily with breakfast.   loratadine (CLARITIN) 10 MG tablet Take 10 mg by mouth daily as needed for allergies.   melatonin 5 MG TABS Take 5 mg by mouth at bedtime.   Multiple Vitamin (MULTIVITAMIN WITH MINERALS) TABS tablet Take 1 tablet by mouth in the morning.   Multiple Vitamins-Minerals (EYE VITAMINS PO) Take 1 tablet by mouth in the morning and at bedtime. Focus Select Zinc Free (   nitroGLYCERIN (NITROSTAT) 0.4 MG SL tablet Place 1 tablet (0.4 mg total) under the tongue every 5 (five) minutes x 3 doses as needed for chest pain.   olmesartan (BENICAR) 40 MG tablet Take 1 tablet (40 mg total) by mouth daily.   pantoprazole (PROTONIX) 40 MG tablet Take 1 tablet (40 mg total) by mouth 2 (two)  times daily.   rivaroxaban (XARELTO) 20 MG TABS tablet TAKE 1 TABLET ONCE DAILY WITH DINNER.   [DISCONTINUED] olmesartan (BENICAR) 20 MG tablet Take 1 tablet (20 mg total) by mouth daily.   [DISCONTINUED] potassium chloride SA (KLOR-CON M) 20 MEQ tablet Take 1 tablet (20 mEq total) by mouth daily.     Allergies:   Metformin and related   Social History   Socioeconomic History   Marital status: Married    Spouse name: Not on file   Number of children: 2   Years of education: Not on file   Highest education level: Not on file  Occupational History   Occupation: Hotel manager    Employer: OTHER  Tobacco Use   Smoking status: Never   Smokeless tobacco: Never  Vaping Use   Vaping Use: Never used  Substance and Sexual Activity   Alcohol use: Yes    Alcohol/week: 9.0 - 12.0 standard drinks of alcohol    Types: 7 - 10 Glasses of wine, 2 Standard drinks or equivalent per week   Drug use: No   Sexual activity: Not on file  Other Topics Concern   Not on file  Social History Narrative   Caffeine: yes, coffee 2-3 cups daily, tea-rare.    Exercise-yes, 2-3X weekly, jog/run.    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    Marital Status: Married.    Children: 2 children-grown               Social Determinants of Radio broadcast assistant Strain: Not on file  Food Insecurity: Not on file  Transportation Needs: Not on file  Physical Activity: Not on file  Stress: Not on file  Social Connections: Not on file     Family History: The patient's family history includes COPD in his mother; Colon cancer (age of onset: 54) in his maternal grandfather; Diabetes in his mother; Diabetes (age of onset: 83) in his brother; Heart attack in his father and paternal grandfather; Heart disease in his mother; Heart disease (age of onset: 23) in his father; Heart failure in his father; Hypertension in his father; Hypertension (age of  onset: 45) in his brother; Hypertension (age of onset: 1) in his mother. There is no history of Stroke.  ROS:   Please see the history of present illness.    Getting regular exercise.  No exercise-induced discomfort in the chest or other problems.  Concerned about blood pressure elevation.  All other systems reviewed and are negative.  EKGs/Labs/Other Studies Reviewed:    The following studies were reviewed today: PET PERFUSION 07/28/2021: Narrative & Impression      The study is normal. The study is low risk.   LV perfusion is normal. There is no evidence of ischemia. There is no evidence of infarction.   Rest left ventricular function is normal. Rest EF: 50 %. Stress left ventricular function is normal. Stress EF: 50 %. End diastolic cavity size is mildly enlarged. End systolic cavity size is normal.   Myocardial blood flow was computed to be 0.45m/g/min at rest and 1.666mg/min at stress. Global myocardial blood flow reserve was 2.30 and was normal.   Coronary calcium was present on the attenuation correction CT images. Moderate coronary calcifications were present. Coronary calcifications were present in the left anterior descending artery, left circumflex artery and right coronary artery distribution(s).   Electronically signed by: GaElouise MunroeMD    EKG:  EKG not performed  Recent Labs: 02/18/2021: BUN 20; Creatinine, Ser 1.13; Magnesium 1.9; Potassium 4.5; Sodium 139  Recent Lipid Panel    Component Value Date/Time   CHOL 133 10/05/2017 0906   TRIG 90 10/05/2017 0906   HDL 45 10/05/2017 0906   CHOLHDL 3.0 10/05/2017 0906   LDLCALC 70 10/05/2017 0906    Physical Exam:    VS:  BP (!) 158/86   Pulse 70   Ht '6\' 1"'$  (1.854 m)   Wt 228 lb 9.6 oz (103.7 kg)   SpO2 96%   BMI 30.16 kg/m     Wt Readings from Last 3 Encounters:  02/02/22 228 lb 9.6 oz (103.7 kg)  09/24/21 234 lb (106.1 kg)  07/23/21 230 lb (104.3 kg)     GEN: Overweight with BMI 30. No acute  distress HEENT: Normal NECK:  No JVD. LYMPHATICS: No lymphadenopathy CARDIAC: No murmur. RRR no gallop, or edema. VASCULAR:  Normal Pulses. No bruits. RESPIRATORY:  Clear to auscultation without rales, wheezing or rhonchi  ABDOMEN: Soft, non-tender, non-distended, No pulsatile mass, MUSCULOSKELETAL: No deformity  SKIN: Warm and dry NEUROLOGIC:  Alert and oriented x 3 PSYCHIATRIC:  Normal affect   ASSESSMENT:    1. Paroxysmal atrial fibrillation (HCC)   2. Coronary artery disease of native artery of native heart with stable angina pectoris ([place])   3. Primary hypertension   4. Hyperlipidemia LDL goal <70   5. OSA (obstructive sleep apnea)   6. Type 2 diabetes mellitus without complication, without long-term current use of insulin (HCC)    PLAN:    In order of problems listed above:  Continue Xarelto.  He will be discussing with Dr. Quentin Ore about the need for continued anticoagulation. Recent low risk nuclear study.  Continue aggressive risk factor modification. Blood pressure is elevated today.  Increase olmesartan to 40 mg/day.  Discontinue K. Dur.  Basic metabolic panel in 2 to 3 weeks. Continue Lipitor 80 mg daily.  Lipid panel performed in April revealed an LDL of 61. Significant CPAP compliance. Consider SGLT2 therapy to improve cardioprotection.     Target BP: <130/80 mmHg  Diet and lifestyle measures for BP control were reviewed in detail: Low sodium diet (<2.5 gm daily); alcohol restriction (<3 ounces per day); weight loss (Mediterranean); avoid non-steroidal agents; > 6 hours sleep per day; 150 min moderate exercise per week. Medical regimen will include at least 2 agents. Resistant hypertension if not controlled on 3 agents. Consider further evaluation: Sleep study to r/o OSA; Renal angiogram; Primary hyperaldonism and Pheochromocytoma w/u. After 3 agents, consider MRA (spironolactone)/ Epleronone), hydralazine, beta-blocker, and Minoxidil if not already in use due to  patient profile.   General cardiology follow-up in 1 year.  Purchase a blood pressure machine and record twice weekly.  Notify us if consistently greater than 140/90 millimeters mercury.  Ideal is 130/80 mmHg.   Medication Adjustments/Labs and Tests Ordered: Current medicines are reviewed at length with the patient today.  Concerns regarding medicines are outlined above.  Orders Placed This Encounter  Procedures   Basic metabolic panel   Meds ordered this encounter  Medications   olmesartan (BENICAR) 40 MG tablet    Sig: Take 1 tablet (40 mg total) by mouth daily.    Dispense:  90 tablet    Refill:  3    Dose change, increased.    Patient Instructions  Medication Instructions:  Your physician has recommended you make the following change in your medication:   1) INCREASE olmesartan (Benicar) to '40mg'$  daily 2) STOP potassium chloride  *If you need a refill on your cardiac medications before your next appointment, please call your pharmacy*  Lab Work: In 2 weeks: BMET If you have labs (blood work) drawn today and your tests are completely normal, you will receive your results only by: [place] (if you have MyChart) OR A paper copy in the mail If you have any lab test that is abnormal or we need to change your treatment, we will call you to review the results.  Follow-Up: At [facility], you and your health needs are our priority.  As part of our continuing mission to provide you with exceptional heart care, we have created designated Provider Care Teams.  These Care Teams include your primary Cardiologist (physician) and Advanced Practice Providers (APPs -  Physician Assistants and Nurse Practitioners)  who all work together to provide you with the care you need, when you need it.  Your next appointment:   1 year(s)  The format for your next appointment:   In Person  Provider:   Sinclair Grooms, MD  Important Information About Sugar          Signed, Sinclair Grooms, MD  02/02/2022 9:18 AM    [place]

## 2022-02-02 ENCOUNTER — Ambulatory Visit: Payer: BC Managed Care – PPO | Attending: Interventional Cardiology | Admitting: Interventional Cardiology

## 2022-02-02 ENCOUNTER — Encounter: Payer: Self-pay | Admitting: Interventional Cardiology

## 2022-02-02 VITALS — BP 154/92 | HR 70 | Ht 73.0 in | Wt 228.6 lb

## 2022-02-02 DIAGNOSIS — E785 Hyperlipidemia, unspecified: Secondary | ICD-10-CM

## 2022-02-02 DIAGNOSIS — I480 Paroxysmal atrial fibrillation: Secondary | ICD-10-CM | POA: Diagnosis not present

## 2022-02-02 DIAGNOSIS — I10 Essential (primary) hypertension: Secondary | ICD-10-CM

## 2022-02-02 DIAGNOSIS — I25118 Atherosclerotic heart disease of native coronary artery with other forms of angina pectoris: Secondary | ICD-10-CM | POA: Diagnosis not present

## 2022-02-02 DIAGNOSIS — G4733 Obstructive sleep apnea (adult) (pediatric): Secondary | ICD-10-CM

## 2022-02-02 DIAGNOSIS — E119 Type 2 diabetes mellitus without complications: Secondary | ICD-10-CM

## 2022-02-02 MED ORDER — OLMESARTAN MEDOXOMIL 40 MG PO TABS: 40.0000 mg | ORAL_TABLET | Freq: Every day | ORAL | 3 refills | Status: DC

## 2022-02-02 NOTE — Patient Instructions (Signed)
Medication Instructions:  Your physician has recommended you make the following change in your medication:   1) INCREASE olmesartan (Benicar) to '40mg'$  daily 2) STOP potassium chloride  *If you need a refill on your cardiac medications before your next appointment, please call your pharmacy*  Lab Work: In 2 weeks: BMET If you have labs (blood work) drawn today and your tests are completely normal, you will receive your results only by: [place] (if you have MyChart) OR A paper copy in the mail If you have any lab test that is abnormal or we need to change your treatment, we will call you to review the results.  Follow-Up: At [facility], you and your health needs are our priority.  As part of our continuing mission to provide you with exceptional heart care, we have created designated Provider Care Teams.  These Care Teams include your primary Cardiologist (physician) and Advanced Practice Providers (APPs -  Physician Assistants and Nurse Practitioners) who all work together to provide you with the care you need, when you need it.  Your next appointment:   1 year(s)  The format for your next appointment:   In Person  Provider:   Sinclair Grooms, MD  Important Information About Sugar

## 2022-02-21 NOTE — Progress Notes (Unsigned)
Electrophysiology Office Follow up Visit Note:    Date:  02/21/2022   ID:  Carlos Gutierrez, Carlos Gutierrez 1951-03-06, MRN [id number]  PCP:  Carlos Pepper, MD  [facility] Cardiologist:  Carlos Grooms, MD  [facility] Electrophysiologist:  Carlos Epley, MD    Interval History:    Carlos Gutierrez is a 71 y.o. male who presents for a follow up visit. They were last seen in clinic Jul 23, 2021.  He is maintained on Tikosyn for persistent atrial fibrillation.  He takes Xarelto for stroke prophylaxis.  At the last appointment he was complaining of some chest discomfort. A PET stress was performed on Jul 28, 2021 and showed no findings concerning for significant ischemia.      Past Medical History:  Diagnosis Date   ACL tear    RIGHT KNEE   Atrial fibrillation ([place]) 2012   Rare occurrences. On ASA & Flecainide prn   Atrial flutter ([place])    s/p ablation 2008. On Flecainide prn.   Broken leg 1968   Concussion 1966   History of 4   Diabetes mellitus without complication ([place])    DVT (deep venous thrombosis) (HCC)    LEFT LOWER LEG   Dyslipidemia    Essential hypertension, benign    Insomnia    Low back pain    Obesity (BMI 30-39.9)    Obstructive sleep apnea on CPAP    Mild   PAF (paroxysmal atrial fibrillation) ([place])    Rare occurrences. On ASA & Flecainide prn   Pure hypercholesterolemia    On Lipitor    Right shoulder pain    Intermittent   Status post ablation of atrial flutter 5809   Umbilical hernia     Past Surgical History:  Procedure Laterality Date   ablation for atrial flutter  01/2002   ATRIAL FIBRILLATION ABLATION N/A 07/18/2020   Procedure: ATRIAL FIBRILLATION ABLATION;  Surgeon: Carlos Epley, MD;  Location: [place] [facility];  Service: Cardiovascular;  Laterality: N/A;   CARDIAC CATHETERIZATION  12/26/01   CARDIAC CATHETERIZATION  04/18/2019   COLONOSCOPY WITH PROPOFOL N/A 03/12/2016   Procedure: COLONOSCOPY WITH PROPOFOL;  Surgeon: Carlos Ada, MD;  [facility];  Service: Endoscopy;  Laterality: N/A;   COLONOSCOPY WITH PROPOFOL N/A 05/05/2019   Procedure: COLONOSCOPY WITH PROPOFOL;  Surgeon: Carlos Ada, MD;  Location: [place];  Service: Endoscopy;  Laterality: N/A;   coronary angiography  1997   40% stenosis   ESOPHAGOGASTRODUODENOSCOPY (EGD) WITH PROPOFOL N/A 10/15/2016   Procedure: ESOPHAGOGASTRODUODENOSCOPY (EGD) WITH PROPOFOL;  Surgeon: Carlos Ada, MD;  [facility];  Service: Endoscopy;  Laterality: N/A;   ESOPHAGOGASTRODUODENOSCOPY (EGD) WITH PROPOFOL N/A 12/14/2019   Procedure: ESOPHAGOGASTRODUODENOSCOPY (EGD) WITH PROPOFOL;  Surgeon: Carlos Ada, MD;  [facility];  Service: Endoscopy;  Laterality: N/A;   LEFT HEART CATH AND CORONARY ANGIOGRAPHY N/A 02/11/2017   Procedure: LEFT HEART CATH AND CORONARY ANGIOGRAPHY;  Surgeon: Carlos Crome, MD;  Location: [place] [facility];  Service: Cardiovascular;  Laterality: N/A;   LEFT HEART CATH AND CORONARY ANGIOGRAPHY N/A 04/18/2019   Procedure: LEFT HEART CATH AND CORONARY ANGIOGRAPHY;  Surgeon: Carlos Crome, MD;  Location: [place] [facility];  Service: Cardiovascular;  Laterality: N/A;   POLYPECTOMY  05/05/2019   Procedure: POLYPECTOMY;  Surgeon: Carlos Ada, MD;  Location[facility];  Service: Endoscopy;;   right knee surgery  1996   ACL, meniscus problems   UMBILICAL HERNIA REPAIR  04/2015  Current Medications: No outpatient medications have been marked as taking for the 02/22/22 encounter (Office Visit) with Carlos Epley, MD.     Allergies:   Metformin and related   Social History   Socioeconomic History   Marital status: Married    Spouse name: Not on file   Number of children: 2   Years of education: Not on file   Highest education level: Not on file  Occupational History   Occupation: Hotel manager    Employer: OTHER  Tobacco Use   Smoking status: Never   Smokeless tobacco: Never  Vaping Use   Vaping  Use: Never used  Substance and Sexual Activity   Alcohol use: Yes    Alcohol/week: 9.0 - 12.0 standard drinks of alcohol    Types: 7 - 10 Glasses of wine, 2 Standard drinks or equivalent per week   Drug use: No   Sexual activity: Not on file  Other Topics Concern   Not on file  Social History Narrative   Caffeine: yes, coffee 2-3 cups daily, tea-rare.    Exercise-yes, 2-3X weekly, jog/run.    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    Marital Status: Married.    Children: 2 children-grown               Social Determinants of Radio broadcast assistant Strain: Not on file  Food Insecurity: Not on file  Transportation Needs: Not on file  Physical Activity: Not on file  Stress: Not on file  Social Connections: Not on file     Family History: The patient's family history includes COPD in his mother; Colon cancer (age of onset: 33) in his maternal grandfather; Diabetes in his mother; Diabetes (age of onset: 54) in his brother; Heart attack in his father and paternal grandfather; Heart disease in his mother; Heart disease (age of onset: 46) in his father; Heart failure in his father; Hypertension in his father; Hypertension (age of onset: 57) in his brother; Hypertension (age of onset: 48) in his mother. There is no history of Stroke.  ROS:   Please see the history of present illness.    All other systems reviewed and are negative.  EKGs/Labs/Other Studies Reviewed:    The following studies were reviewed today:   EKG:  The ekg ordered today demonstrates ***  Recent Labs: No results found for requested labs within last 365 days.  Recent Lipid Panel    Component Value Date/Time   CHOL 133 10/05/2017 0906   TRIG 90 10/05/2017 0906   HDL 45 10/05/2017 0906   CHOLHDL 3.0 10/05/2017 0906   LDLCALC 70 10/05/2017 0906    Physical Exam:    VS:  There were no vitals taken for this visit.    Wt Readings from  Last 3 Encounters:  02/02/22 228 lb 9.6 oz (103.7 kg)  09/24/21 234 lb (106.1 kg)  07/23/21 230 lb (104.3 kg)     GEN: *** Well nourished, well developed in no acute distress HEENT: Normal NECK: No JVD; No carotid bruits LYMPHATICS: No lymphadenopathy CARDIAC: ***RRR, no murmurs, rubs, gallops RESPIRATORY:  Clear to auscultation without rales, wheezing or rhonchi  ABDOMEN: Soft, non-tender, non-distended MUSCULOSKELETAL:  No edema; No deformity  SKIN: Warm and dry NEUROLOGIC:  Alert and oriented x 3 PSYCHIATRIC:  Normal affect        ASSESSMENT:    1. Persistent atrial fibrillation ([place])   2. Primary hypertension    PLAN:  In order of problems listed above:   #Persistent atrial fibrillation He stopped his Tikosyn after the May 2023 appointment and has done well.  We discussed anticoagulation and his stroke risk moving forward given an elevated CHA2DS2-VASc.  We discussed the lack of clear data to guide the cessation of anticoagulation after a seemingly successful catheter ablation.  After our discussion,***  CHA2DS2-VASc Score = 4  The patient's score is based upon: CHF History: 0 HTN History: 1 Diabetes History: 1 Stroke History: 0 Vascular Disease History: 1 Age Score: 1 Gender Score: 0   #Hypertension *** goal today.  Recommend checking blood pressures 1-2 times per week at home and recording the values.  Recommend bringing these recordings to the primary care physician.      Total time spent with patient today *** minutes. This includes reviewing records, evaluating the patient and coordinating care.   Medication Adjustments/Labs and Tests Ordered: Current medicines are reviewed at length with the patient today.  Concerns regarding medicines are outlined above.  No orders of the defined types were placed in this encounter.  No orders of the defined types were placed in this encounter.    Signed, Lars Mage, MD, [facility], [facility] 02/21/2022 4:17  PM    Electrophysiology [facility]

## 2022-02-22 ENCOUNTER — Encounter: Payer: Self-pay | Admitting: Cardiology

## 2022-02-22 ENCOUNTER — Ambulatory Visit: Payer: BC Managed Care – PPO

## 2022-02-22 ENCOUNTER — Ambulatory Visit: Payer: BC Managed Care – PPO | Attending: Cardiology | Admitting: Cardiology

## 2022-02-22 VITALS — BP 136/82 | HR 58 | Ht 73.0 in | Wt 230.0 lb

## 2022-02-22 DIAGNOSIS — I4819 Other persistent atrial fibrillation: Secondary | ICD-10-CM | POA: Diagnosis not present

## 2022-02-22 DIAGNOSIS — I10 Essential (primary) hypertension: Secondary | ICD-10-CM | POA: Diagnosis not present

## 2022-02-22 NOTE — Patient Instructions (Signed)
Medication Instructions:  Your physician recommends that you continue on your current medications as directed. Please refer to the Current Medication list given to you today.  *If you need a refill on your cardiac medications before your next appointment, please call your pharmacy*  Follow-Up: At [facility], you and your health needs are our priority.  As part of our continuing mission to provide you with exceptional heart care, we have created designated Provider Care Teams.  These Care Teams include your primary Cardiologist (physician) and Advanced Practice Providers (APPs -  Physician Assistants and Nurse Practitioners) who all work together to provide you with the care you need, when you need it.  Your next appointment:   1 year(s)  The format for your next appointment:   In Person  Provider:   You will see one of the following Advanced Practice Providers on your designated Care Team:   Tommye Standard, [place] Legrand Como "Jonni Sanger" Chalmers Cater, [place]   Important Information About Sugar

## 2022-02-22 NOTE — Progress Notes (Signed)
Electrophysiology Office Follow up Visit Note:    Date:  02/22/2022   ID:  Carlos Gutierrez, Carlos Gutierrez [birth year], MRN [id number]  PCP:  London Pepper, MD  [facility] Cardiologist:  Sinclair Grooms, MD  [facility] Electrophysiologist:  [name], MD    Interval History:    Carlos Gutierrez is a 71 y.o. male who presents for a follow up visit. They were last seen in clinic Jul 23, 2021.  He is maintained on Tikosyn for persistent atrial fibrillation.  He takes Xarelto for stroke prophylaxis.  At the last appointment he was complaining of some chest discomfort.  A PET stress was performed on Jul 28, 2021 and showed no findings concerning for significant ischemia.  Today, he reports no further Afib or arrhythmic episodes since his ablation. He remains compliant with Xarelto. No bleeding issues.  Lately he states that his blood pressures have been gradually more controlled. He does not wish to start a diuretic. We discussed recommendations for hypertension management including the mediterranean diet and keeping up with aerobic activity. He is working with a Physiological scientist about once a week. They mostly focus on weight lifting with high reps, and then complete 15-20 minutes of cardio exercise. He plans to complete a 10K in April. Additionally he continues to enjoy golf.  He denies any palpitations, chest pain, shortness of breath, or peripheral edema. No lightheadedness, headaches, syncope, orthopnea, or PND.     Past Medical History:  Diagnosis Date   ACL tear    RIGHT KNEE   Atrial fibrillation ([place]) 2012   Rare occurrences. On ASA & Flecainide prn   Atrial flutter ([place])    s/p ablation 2008. On Flecainide prn.   Broken leg 1968   Concussion 1966   History of 4   Diabetes mellitus without complication ([place])    DVT (deep venous thrombosis) ([facility])    LEFT LOWER LEG   Dyslipidemia    Essential hypertension, benign    Insomnia    Low back pain    Obesity (BMI 30-39.9)     Obstructive sleep apnea on CPAP    Mild   PAF (paroxysmal atrial fibrillation) ([place])    Rare occurrences. On ASA & Flecainide prn   Pure hypercholesterolemia    On Lipitor    Right shoulder pain    Intermittent   Status post ablation of atrial flutter 0272   Umbilical hernia     Past Surgical History:  Procedure Laterality Date   ablation for atrial flutter  01/2002   ATRIAL FIBRILLATION ABLATION N/A 07/18/2020   Procedure: ATRIAL FIBRILLATION ABLATION;  Surgeon: [name], MD;  Location: [place] [facility];  Service: Cardiovascular;  Laterality: N/A;   CARDIAC CATHETERIZATION  12/26/01   CARDIAC CATHETERIZATION  04/18/2019   COLONOSCOPY WITH PROPOFOL N/A 03/12/2016   Procedure: COLONOSCOPY WITH PROPOFOL;  Surgeon: [name], MD;  Location: [facility];  Service: Endoscopy;  Laterality: N/A;   COLONOSCOPY WITH PROPOFOL N/A 05/05/2019   Procedure: COLONOSCOPY WITH PROPOFOL;  Surgeon: [name], MD;  Location: [place];  Service: Endoscopy;  Laterality: N/A;   coronary angiography  1997   40% stenosis   ESOPHAGOGASTRODUODENOSCOPY (EGD) WITH PROPOFOL N/A 10/15/2016   Procedure: ESOPHAGOGASTRODUODENOSCOPY (EGD) WITH PROPOFOL;  Surgeon: [name], MD;  Location: [facility];  Service: Endoscopy;  Laterality: N/A;   ESOPHAGOGASTRODUODENOSCOPY (EGD) WITH PROPOFOL N/A 12/14/2019   Procedure: ESOPHAGOGASTRODUODENOSCOPY (EGD) WITH PROPOFOL;  Surgeon: [name], MD;  Location: [facility];  Service: Endoscopy;  Laterality: N/A;   LEFT HEART CATH AND CORONARY ANGIOGRAPHY N/A 02/11/2017   Procedure: LEFT HEART CATH AND CORONARY ANGIOGRAPHY;  Surgeon: Belva Crome, MD;  Location: [place] [facility];  Service: Cardiovascular;  Laterality: N/A;   LEFT HEART CATH AND CORONARY ANGIOGRAPHY N/A 04/18/2019   Procedure: LEFT HEART CATH AND CORONARY ANGIOGRAPHY;  Surgeon: Belva Crome, MD;  Location: [place] [facility];  Service: Cardiovascular;  Laterality: N/A;    POLYPECTOMY  05/05/2019   Procedure: POLYPECTOMY;  Surgeon: [name], MD;  Location: [place];  Service: Endoscopy;;   right knee surgery  1996   ACL, meniscus problems   UMBILICAL HERNIA REPAIR  04/2015    Current Medications: Current Meds  Medication Sig   acetaminophen (TYLENOL) 500 MG tablet Take 1,000 mg by mouth as needed for mild pain, moderate pain or headache.    Ascorbic Acid (VITAMIN C) 1000 MG tablet Take 1,000 mg by mouth in the morning.   aspirin EC 81 MG tablet Take 1 tablet (81 mg total) by mouth daily.   atorvastatin (LIPITOR) 80 MG tablet Take 1 tablet (80 mg total) by mouth daily at 6 PM.   cetirizine (ZYRTEC) 10 MG tablet Take 10 mg by mouth at bedtime as needed for allergies.   Cholecalciferol (VITAMIN D3) 50 MCG (2000 UT) TABS Take 2,000 Units by mouth in the morning.   clindamycin (CLEOCIN T) 1 % SWAB Apply 1 application topically 2 (two) times daily.   glipiZIDE (GLUCOTROL XL) 2.5 MG 24 hr tablet Take 2.5 mg by mouth daily with breakfast.   loratadine (CLARITIN) 10 MG tablet Take 10 mg by mouth daily as needed for allergies.   melatonin 5 MG TABS Take 5 mg by mouth at bedtime.   Multiple Vitamin (MULTIVITAMIN WITH MINERALS) TABS tablet Take 1 tablet by mouth in the morning.   Multiple Vitamins-Minerals (EYE VITAMINS PO) Take 1 tablet by mouth in the morning and at bedtime. Focus Select Zinc Free (   nitroGLYCERIN (NITROSTAT) 0.4 MG SL tablet Place 1 tablet (0.4 mg total) under the tongue every 5 (five) minutes x 3 doses as needed for chest pain.   olmesartan (BENICAR) 40 MG tablet Take 1 tablet (40 mg total) by mouth daily.   pantoprazole (PROTONIX) 40 MG tablet Take 1 tablet (40 mg total) by mouth 2 (two) times daily.   rivaroxaban (XARELTO) 20 MG TABS tablet TAKE 1 TABLET ONCE DAILY WITH DINNER.   sildenafil (VIAGRA) 50 MG tablet Take 25 mg by mouth as needed.     Allergies:   Metformin and related   Social History   Socioeconomic History   Marital  status: Married    Spouse name: Not on file   Number of children: 2   Years of education: Not on file   Highest education level: Not on file  Occupational History   Occupation: Hotel manager    Employer: OTHER  Tobacco Use   Smoking status: Never   Smokeless tobacco: Never  Vaping Use   Vaping Use: Never used  Substance and Sexual Activity   Alcohol use: Yes    Alcohol/week: 9.0 - 12.0 standard drinks of alcohol    Types: 7 - 10 Glasses of wine, 2 Standard drinks or equivalent per week   Drug use: No   Sexual activity: Not on file  Other Topics Concern   Not on file  Social History Narrative   Caffeine: yes, coffee 2-3 cups daily, tea-rare.  Exercise-yes, 2-3X weekly, jog/run.    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    Marital Status: Married.    Children: 2 children-grown               Social Determinants of Radio broadcast assistant Strain: Not on file  Food Insecurity: Not on file  Transportation Needs: Not on file  Physical Activity: Not on file  Stress: Not on file  Social Connections: Not on file     Family History: The patient's family history includes COPD in his mother; Colon cancer (age of onset: 81) in his maternal grandfather; Diabetes in his mother; Diabetes (age of onset: 49) in his brother; Heart attack in his father and paternal grandfather; Heart disease in his mother; Heart disease (age of onset: 17) in his father; Heart failure in his father; Hypertension in his father; Hypertension (age of onset: 86) in his brother; Hypertension (age of onset: 54) in his mother. There is no history of Stroke.  ROS:   Please see the history of present illness.    All other systems reviewed and are negative.  EKGs/Labs/Other Studies Reviewed:    The following studies were reviewed today:   EKG:  The ekg ordered today demonstrates sinus rhythm.  Ventricular rate 58 bpm.  Recent Labs: No  results found for requested labs within last 365 days.   Recent Lipid Panel    Component Value Date/Time   CHOL 133 10/05/2017 0906   TRIG 90 10/05/2017 0906   HDL 45 10/05/2017 0906   CHOLHDL 3.0 10/05/2017 0906   LDLCALC 70 10/05/2017 0906    Physical Exam:    VS:  BP 136/82   Pulse (!) 58   Ht '6\' 1"'$  (1.854 m)   Wt 230 lb (104.3 kg)   SpO2 92%   BMI 30.34 kg/m     Wt Readings from Last 3 Encounters:  02/22/22 230 lb (104.3 kg)  02/02/22 228 lb 9.6 oz (103.7 kg)  09/24/21 234 lb (106.1 kg)     GEN: Well nourished, well developed in no acute distress.  Appears younger than stated age 8: Normal NECK: No JVD; No carotid bruits LYMPHATICS: No lymphadenopathy CARDIAC: RRR, no murmurs, rubs, gallops RESPIRATORY:  Clear to auscultation without rales, wheezing or rhonchi  ABDOMEN: Soft, non-tender, non-distended MUSCULOSKELETAL:  No edema; No deformity  SKIN: Warm and dry NEUROLOGIC:  Alert and oriented x 3 PSYCHIATRIC:  Normal affect       ASSESSMENT:    1. Persistent atrial fibrillation ([place])   2. Primary hypertension    PLAN:    In order of problems listed above:   #Persistent atrial fibrillation He stopped his Tikosyn after the May 2023 appointment and has done well.  We discussed anticoagulation and his stroke risk moving forward given an elevated CHA2DS2-VASc.  We discussed the lack of clear data to guide the cessation of anticoagulation after a seemingly successful catheter ablation.  After our discussion, we have decided to stay on the blood thinner for now.  CHA2DS2-VASc Score = 4  The patient's score is based upon: CHF History: 0 HTN History: 1 Diabetes History: 1 Stroke History: 0 Vascular Disease History: 1 Age Score: 1 Gender Score: 0   #Hypertension At goal today.  Recommend checking blood pressures 1-2 times per week at home and recording the values.  Recommend bringing these recordings to the primary care physician. We discussed the  Mediterranean diet during today's clinic appointment.  I will provide some information for him on his discharge paperwork.  Follow-up 1 year with APP.    Medication Adjustments/Labs and Tests Ordered: Current medicines are reviewed at length with the patient today.  Concerns regarding medicines are outlined above.   No orders of the defined types were placed in this encounter.  No orders of the defined types were placed in this encounter.  I,Mathew Stumpf,acting as a Education administrator for [name], MD.,have documented all relevant documentation on the behalf of [name], MD,as directed by  [name], MD while in the presence of [name], MD.  I, [name], MD, have reviewed all documentation for this visit. The documentation on 02/22/22 for the exam, diagnosis, procedures, and orders are all accurate and complete.   Signed, Lars Mage, MD, [facility], [facility] 02/22/2022 8:45 AM    Electrophysiology [facility]

## 2022-02-23 LAB — BASIC METABOLIC PANEL
BUN/Creatinine Ratio: 12 (ref 10–24)
BUN: 14 mg/dL (ref 8–27)
CO2: 24 mmol/L (ref 20–29)
Calcium: 9.9 mg/dL (ref 8.6–10.2)
Chloride: 103 mmol/L (ref 96–106)
Creatinine, Ser: 1.14 mg/dL (ref 0.76–1.27)
Glucose: 162 mg/dL — ABNORMAL HIGH (ref 70–99)
Potassium: 4.6 mmol/L (ref 3.5–5.2)
Sodium: 140 mmol/L (ref 134–144)
eGFR: 70 mL/min/{1.73_m2} (ref >59)

## 2022-03-11 DIAGNOSIS — R051 Acute cough: Secondary | ICD-10-CM | POA: Diagnosis not present

## 2022-06-18 ENCOUNTER — Other Ambulatory Visit: Payer: Self-pay | Admitting: *Deleted

## 2022-06-18 MED ORDER — ATORVASTATIN CALCIUM 80 MG PO TABS: 80.0000 mg | ORAL_TABLET | Freq: Every day | ORAL | 2 refills | Status: DC

## 2022-06-21 ENCOUNTER — Other Ambulatory Visit: Payer: Self-pay | Admitting: *Deleted

## 2022-06-21 MED ORDER — PANTOPRAZOLE SODIUM 40 MG PO TBEC: 40.0000 mg | DELAYED_RELEASE_TABLET | Freq: Two times a day (BID) | ORAL | 2 refills | Status: DC

## 2022-07-19 DIAGNOSIS — I4891 Unspecified atrial fibrillation: Secondary | ICD-10-CM | POA: Diagnosis not present

## 2022-07-19 DIAGNOSIS — N400 Enlarged prostate without lower urinary tract symptoms: Secondary | ICD-10-CM | POA: Diagnosis not present

## 2022-07-19 DIAGNOSIS — I10 Essential (primary) hypertension: Secondary | ICD-10-CM | POA: Diagnosis not present

## 2022-07-19 DIAGNOSIS — Z0000 Encounter for general adult medical examination without abnormal findings: Secondary | ICD-10-CM | POA: Diagnosis not present

## 2022-07-19 DIAGNOSIS — E785 Hyperlipidemia, unspecified: Secondary | ICD-10-CM | POA: Diagnosis not present

## 2022-07-19 DIAGNOSIS — E1169 Type 2 diabetes mellitus with other specified complication: Secondary | ICD-10-CM | POA: Diagnosis not present

## 2022-07-19 DIAGNOSIS — Z79899 Other long term (current) drug therapy: Secondary | ICD-10-CM | POA: Diagnosis not present

## 2022-07-19 DIAGNOSIS — Z5181 Encounter for therapeutic drug level monitoring: Secondary | ICD-10-CM | POA: Diagnosis not present

## 2022-07-30 ENCOUNTER — Other Ambulatory Visit: Payer: Self-pay

## 2022-07-30 DIAGNOSIS — I4819 Other persistent atrial fibrillation: Secondary | ICD-10-CM

## 2022-07-30 MED ORDER — RIVAROXABAN 20 MG PO TABS: ORAL_TABLET | ORAL | 1 refills | Status: DC

## 2022-07-30 NOTE — Telephone Encounter (Signed)
Prescription refill request for Xarelto received.  Indication:afib Last office visit:12/23 Weight:104.3  kg Age:72 Scr:1.1 CrCl:92.18   ml/min  Prescription refilled

## 2022-08-18 MED ORDER — PANTOPRAZOLE SODIUM 40 MG PO TBEC: 40.0000 mg | DELAYED_RELEASE_TABLET | Freq: Two times a day (BID) | ORAL | 2 refills | Status: DC

## 2022-08-18 NOTE — Telephone Encounter (Signed)
OK to refill for 1 year 

## 2022-09-30 DIAGNOSIS — L570 Actinic keratosis: Secondary | ICD-10-CM | POA: Diagnosis not present

## 2022-09-30 DIAGNOSIS — Z85828 Personal history of other malignant neoplasm of skin: Secondary | ICD-10-CM | POA: Diagnosis not present

## 2022-09-30 DIAGNOSIS — D225 Melanocytic nevi of trunk: Secondary | ICD-10-CM | POA: Diagnosis not present

## 2022-09-30 DIAGNOSIS — L814 Other melanin hyperpigmentation: Secondary | ICD-10-CM | POA: Diagnosis not present

## 2022-09-30 DIAGNOSIS — L821 Other seborrheic keratosis: Secondary | ICD-10-CM | POA: Diagnosis not present

## 2022-10-14 DIAGNOSIS — I10 Essential (primary) hypertension: Secondary | ICD-10-CM | POA: Diagnosis not present

## 2022-10-22 ENCOUNTER — Encounter: Payer: Self-pay | Admitting: Podiatry

## 2022-10-22 ENCOUNTER — Ambulatory Visit (INDEPENDENT_AMBULATORY_CARE_PROVIDER_SITE_OTHER): Payer: BC Managed Care – PPO

## 2022-10-22 ENCOUNTER — Ambulatory Visit (INDEPENDENT_AMBULATORY_CARE_PROVIDER_SITE_OTHER): Payer: BC Managed Care – PPO | Admitting: Podiatry

## 2022-10-22 DIAGNOSIS — M2042 Other hammer toe(s) (acquired), left foot: Secondary | ICD-10-CM

## 2022-10-22 DIAGNOSIS — Z800 Family history of malignant neoplasm of digestive organs: Secondary | ICD-10-CM | POA: Insufficient documentation

## 2022-10-22 DIAGNOSIS — K5731 Diverticulosis of large intestine without perforation or abscess with bleeding: Secondary | ICD-10-CM | POA: Insufficient documentation

## 2022-10-22 DIAGNOSIS — M2012 Hallux valgus (acquired), left foot: Secondary | ICD-10-CM

## 2022-10-22 DIAGNOSIS — M21961 Unspecified acquired deformity of right lower leg: Secondary | ICD-10-CM | POA: Diagnosis not present

## 2022-10-22 DIAGNOSIS — M2011 Hallux valgus (acquired), right foot: Secondary | ICD-10-CM

## 2022-10-22 DIAGNOSIS — K7460 Unspecified cirrhosis of liver: Secondary | ICD-10-CM | POA: Insufficient documentation

## 2022-10-22 DIAGNOSIS — K219 Gastro-esophageal reflux disease without esophagitis: Secondary | ICD-10-CM | POA: Insufficient documentation

## 2022-10-22 DIAGNOSIS — K766 Portal hypertension: Secondary | ICD-10-CM | POA: Insufficient documentation

## 2022-10-22 DIAGNOSIS — M2041 Other hammer toe(s) (acquired), right foot: Secondary | ICD-10-CM | POA: Diagnosis not present

## 2022-10-22 DIAGNOSIS — D509 Iron deficiency anemia, unspecified: Secondary | ICD-10-CM | POA: Insufficient documentation

## 2022-10-22 DIAGNOSIS — M2410 Other articular cartilage disorders, unspecified site: Secondary | ICD-10-CM

## 2022-10-22 NOTE — Patient Instructions (Signed)

## 2022-10-22 NOTE — Progress Notes (Unsigned)
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  He states the findings continue to be symptomatic the right side worse than left.  He also has bunions that are symptomatic.  He still wears the orthotics he tried shoe modification without significant improvement in place continue to worsen was proceed with surgery on the right side.  History of DVT of the left lower extremity. On Xarelto Negative tobacco use   Objective: AAO x3, NAD DP/PT pulses palpable bilaterally, CRT less than 3 seconds Moderate bunions are present.  There is no pain or crepitation with MPJ range of motion.  No hypermobility of the first ray.  Tenderness directly upon palpation of the first MTPJ on the right > left.  Hammertoes are present and there is irritation the dorsal PIPJ from irritation site she has there is no skin breakdown or warmth. No pain with calf compression, swelling, warmth, erythema  Assessment: Symptomatic bunion right side worse than left; hammertoe  Plan: -All treatment options discussed with the patient including all alternatives, risks, complications.  -X-rays were obtained and reviewed.  3 views of the foot were obtained.  There is no evidence of acute fracture.  Intermetatarsal angle of 15 degrees.  There is some mild arthritic changes present the first MPJ. -We discussed both surgical versus conservative treatment.  He is attempted conservative treatment significant improvement.  Discussed surgical intervention we discussed different options including more traditional bunion surgery versus first MPJ arthrodesis.  After discussion elected proceed with also, Aiken bunionectomy with possible graft in the first MPJ as well  as second metatarsal shortening osteotomy and hammertoe repair. -The incision placement as well as the postoperative course was discussed with the patient. I discussed risks of the surgery which include, but not limited to, infection, bleeding, pain, swelling, need for further surgery, delayed or nonhealing, painful or ugly scar, numbness or sensation changes, over/under correction, recurrence, transfer lesions, further deformity, hardware failure, DVT/PE, loss of toe/foot. Patient understands these risks and wishes to proceed with surgery. The surgical consent was reviewed with the patient all 3 pages were signed. No promises or guarantees were given to the outcome of the procedure. All questions were answered to the best of my ability. Before the surgery the patient was encouraged to call the office if there is any further questions. The surgery will be performed at the [facility] on an outpatient basis. -Patient encouraged to call the office with any questions, concerns, change in symptoms.   Vivi Barrack DPM

## 2022-11-14 DIAGNOSIS — I10 Essential (primary) hypertension: Secondary | ICD-10-CM | POA: Diagnosis not present

## 2022-11-16 ENCOUNTER — Telehealth: Payer: Self-pay | Admitting: Urology

## 2022-11-16 NOTE — Telephone Encounter (Signed)
DOS - 12/01/22  DOUBLE OSTEOTOMY RIGHT --- 28299 METATARSAL OSTEOTOMY 2ND RIGHT --- 30865 HAMMERTOE REPAIR 2ND RIGHT --- 78469 CURETTAGE OF BONE TUMOR BENIGN RIGHT --- 62952  [employer] EFFECTIVE DATE - 03/15/20  SPOKE WITH ANTHONY WITH [employer] AND HE STATED THAT FOR CPT CODES 84132,44010 AND 27253 NO PRIOR AUTH IS REQUIRED. SPOKE WITH CHELSEA W. WITH [employer] AND SHE STATED THAT FOR CPT CODE 66440 NO PRIOR AUTH IS REQUIRED.  CALL REF # ANTHONY J. 11/09/22 AT 10:07AM EST CALL REF # CHELSEA W. 11/16/22 AT 4:23 PM EST

## 2022-11-24 ENCOUNTER — Encounter: Payer: Self-pay | Admitting: Podiatry

## 2022-11-24 ENCOUNTER — Encounter: Payer: Self-pay | Admitting: Cardiology

## 2022-11-24 NOTE — Telephone Encounter (Signed)
Can we send to his cardiologist?

## 2022-12-01 ENCOUNTER — Other Ambulatory Visit: Payer: Self-pay | Admitting: Podiatry

## 2022-12-01 ENCOUNTER — Observation Stay (HOSPITAL_COMMUNITY)
Admission: EM | Admit: 2022-12-01 | Discharge: 2022-12-02 | Disposition: A | Discharge status: Home / Self Care | Payer: BC Managed Care – PPO | Attending: Internal Medicine | Admitting: Internal Medicine

## 2022-12-01 ENCOUNTER — Other Ambulatory Visit: Payer: Self-pay

## 2022-12-01 ENCOUNTER — Telehealth: Payer: Self-pay | Admitting: Cardiology

## 2022-12-01 DIAGNOSIS — M2041 Other hammer toe(s) (acquired), right foot: Secondary | ICD-10-CM | POA: Diagnosis not present

## 2022-12-01 DIAGNOSIS — M21541 Acquired clubfoot, right foot: Secondary | ICD-10-CM | POA: Diagnosis not present

## 2022-12-01 DIAGNOSIS — I2510 Atherosclerotic heart disease of native coronary artery without angina pectoris: Secondary | ICD-10-CM | POA: Diagnosis not present

## 2022-12-01 DIAGNOSIS — M21611 Bunion of right foot: Secondary | ICD-10-CM | POA: Diagnosis not present

## 2022-12-01 DIAGNOSIS — I480 Paroxysmal atrial fibrillation: Secondary | ICD-10-CM | POA: Diagnosis not present

## 2022-12-01 DIAGNOSIS — E669 Obesity, unspecified: Secondary | ICD-10-CM | POA: Diagnosis present

## 2022-12-01 DIAGNOSIS — E1165 Type 2 diabetes mellitus with hyperglycemia: Secondary | ICD-10-CM | POA: Diagnosis not present

## 2022-12-01 DIAGNOSIS — M205X1 Other deformities of toe(s) (acquired), right foot: Secondary | ICD-10-CM | POA: Diagnosis not present

## 2022-12-01 DIAGNOSIS — Z7984 Long term (current) use of oral hypoglycemic drugs: Secondary | ICD-10-CM | POA: Insufficient documentation

## 2022-12-01 DIAGNOSIS — Z7982 Long term (current) use of aspirin: Secondary | ICD-10-CM | POA: Diagnosis not present

## 2022-12-01 DIAGNOSIS — R001 Bradycardia, unspecified: Secondary | ICD-10-CM | POA: Diagnosis not present

## 2022-12-01 DIAGNOSIS — I441 Atrioventricular block, second degree: Secondary | ICD-10-CM | POA: Diagnosis not present

## 2022-12-01 DIAGNOSIS — Z7901 Long term (current) use of anticoagulants: Secondary | ICD-10-CM | POA: Insufficient documentation

## 2022-12-01 DIAGNOSIS — Z79899 Other long term (current) drug therapy: Secondary | ICD-10-CM | POA: Diagnosis not present

## 2022-12-01 DIAGNOSIS — Z86718 Personal history of other venous thrombosis and embolism: Secondary | ICD-10-CM | POA: Insufficient documentation

## 2022-12-01 DIAGNOSIS — E119 Type 2 diabetes mellitus without complications: Secondary | ICD-10-CM | POA: Diagnosis not present

## 2022-12-01 DIAGNOSIS — G8918 Other acute postprocedural pain: Secondary | ICD-10-CM | POA: Diagnosis not present

## 2022-12-01 DIAGNOSIS — I10 Essential (primary) hypertension: Secondary | ICD-10-CM | POA: Diagnosis not present

## 2022-12-01 DIAGNOSIS — G4733 Obstructive sleep apnea (adult) (pediatric): Secondary | ICD-10-CM | POA: Diagnosis present

## 2022-12-01 DIAGNOSIS — I4891 Unspecified atrial fibrillation: Secondary | ICD-10-CM | POA: Diagnosis not present

## 2022-12-01 DIAGNOSIS — M2011 Hallux valgus (acquired), right foot: Secondary | ICD-10-CM | POA: Diagnosis not present

## 2022-12-01 LAB — BASIC METABOLIC PANEL WITH GFR
Anion gap: 8 (ref 5–15)
BUN: 16 mg/dL (ref 8–23)
CO2: 20 mmol/L — ABNORMAL LOW (ref 22–32)
Calcium: 9.1 mg/dL (ref 8.9–10.3)
Chloride: 109 mmol/L (ref 98–111)
Creatinine, Ser: 1.04 mg/dL (ref 0.61–1.24)
GFR, Estimated: >60 mL/min (ref >60)
Glucose, Bld: 122 mg/dL — ABNORMAL HIGH (ref 70–99)
Potassium: 4.3 mmol/L (ref 3.5–5.1)
Sodium: 137 mmol/L (ref 135–145)

## 2022-12-01 LAB — CBC
HCT: 45.6 % (ref 39.0–52.0)
Hemoglobin: 14.7 g/dL (ref 13.0–17.0)
MCH: 29.9 pg (ref 26.0–34.0)
MCHC: 32.2 g/dL (ref 30.0–36.0)
MCV: 92.9 fL (ref 80.0–100.0)
Platelets: 147 10*3/uL — ABNORMAL LOW (ref 150–400)
RBC: 4.91 MIL/uL (ref 4.22–5.81)
RDW: 13.8 % (ref 11.5–15.5)
WBC: 5.7 10*3/uL (ref 4.0–10.5)
nRBC: 0 % (ref 0.0–0.2)

## 2022-12-01 LAB — CBG MONITORING, ED: Glucose-Capillary: 140 mg/dL — ABNORMAL HIGH (ref 70–99)

## 2022-12-01 LAB — TROPONIN I (HIGH SENSITIVITY): Troponin I (High Sensitivity): 10 ng/L (ref <18)

## 2022-12-01 LAB — MAGNESIUM: Magnesium: 2.1 mg/dL (ref 1.7–2.4)

## 2022-12-01 MED ORDER — MELATONIN 5 MG PO TABS
5.0000 mg | ORAL_TABLET | Freq: Every day | ORAL | Status: DC
  Administered 2022-12-01: 5 mg via ORAL
  Filled 2022-12-01: qty 1

## 2022-12-01 MED ORDER — OLMESARTAN MEDOXOMIL 40 MG PO TABS: 40.0000 mg | ORAL_TABLET | Freq: Every day | ORAL | 0 refills | Status: DC

## 2022-12-01 MED ORDER — LORATADINE 10 MG PO TABS
10.0000 mg | ORAL_TABLET | Freq: Every day | ORAL | Status: DC
  Administered 2022-12-02: 10 mg via ORAL
  Filled 2022-12-01: qty 1

## 2022-12-01 MED ORDER — INSULIN ASPART 100 UNIT/ML IJ SOLN: 0.0000 [IU] | Freq: Every day | INTRAMUSCULAR | Status: DC

## 2022-12-01 MED ORDER — PROMETHAZINE HCL 25 MG PO TABS: 25.0000 mg | ORAL_TABLET | Freq: Three times a day (TID) | ORAL | 0 refills | Status: DC | PRN

## 2022-12-01 MED ORDER — RIVAROXABAN 10 MG PO TABS
20.0000 mg | ORAL_TABLET | Freq: Every day | ORAL | Status: DC
  Administered 2022-12-01: 20 mg via ORAL
  Filled 2022-12-01: qty 2

## 2022-12-01 MED ORDER — OXYCODONE-ACETAMINOPHEN 5-325 MG PO TABS
1.0000 | ORAL_TABLET | Freq: Four times a day (QID) | ORAL | 0 refills | Status: DC | PRN
Start: 2022-12-01 — End: 2023-01-03

## 2022-12-01 MED ORDER — ACETAMINOPHEN 325 MG PO TABS
650.0000 mg | ORAL_TABLET | Freq: Four times a day (QID) | ORAL | Status: DC | PRN
  Administered 2022-12-02: 650 mg via ORAL
  Filled 2022-12-01: qty 2

## 2022-12-01 MED ORDER — OXYCODONE-ACETAMINOPHEN 5-325 MG PO TABS
1.0000 | ORAL_TABLET | Freq: Four times a day (QID) | ORAL | Status: DC | PRN
  Administered 2022-12-01 – 2022-12-02 (×3): 2 via ORAL
  Filled 2022-12-01 (×3): qty 2

## 2022-12-01 MED ORDER — VITAMIN C 500 MG PO TABS
1000.0000 mg | ORAL_TABLET | Freq: Every day | ORAL | Status: DC
  Administered 2022-12-02: 1000 mg via ORAL
  Filled 2022-12-01 (×2): qty 2

## 2022-12-01 MED ORDER — PANTOPRAZOLE SODIUM 40 MG PO TBEC
40.0000 mg | DELAYED_RELEASE_TABLET | Freq: Two times a day (BID) | ORAL | Status: DC
  Administered 2022-12-01 – 2022-12-02 (×2): 40 mg via ORAL
  Filled 2022-12-01 (×2): qty 1

## 2022-12-01 MED ORDER — IRBESARTAN 300 MG PO TABS
300.0000 mg | ORAL_TABLET | Freq: Every day | ORAL | Status: DC
  Administered 2022-12-01 – 2022-12-02 (×2): 300 mg via ORAL
  Filled 2022-12-01 (×2): qty 1

## 2022-12-01 MED ORDER — ATORVASTATIN CALCIUM 80 MG PO TABS
80.0000 mg | ORAL_TABLET | Freq: Every day | ORAL | Status: DC
  Administered 2022-12-01: 80 mg via ORAL
  Filled 2022-12-01: qty 2

## 2022-12-01 MED ORDER — CEPHALEXIN 500 MG PO CAPS: 500.0000 mg | ORAL_CAPSULE | Freq: Three times a day (TID) | ORAL | 0 refills | Status: DC

## 2022-12-01 MED ORDER — CEPHALEXIN 250 MG PO CAPS
500.0000 mg | ORAL_CAPSULE | Freq: Three times a day (TID) | ORAL | Status: DC
  Administered 2022-12-01 – 2022-12-02 (×3): 500 mg via ORAL
  Filled 2022-12-01 (×3): qty 2

## 2022-12-01 MED ORDER — VITAMIN D 25 MCG (1000 UNIT) PO TABS
2000.0000 [IU] | ORAL_TABLET | Freq: Every day | ORAL | Status: DC
  Administered 2022-12-02: 2000 [IU] via ORAL
  Filled 2022-12-01: qty 2

## 2022-12-01 MED ORDER — INSULIN ASPART 100 UNIT/ML IJ SOLN
0.0000 [IU] | Freq: Three times a day (TID) | INTRAMUSCULAR | Status: DC
  Administered 2022-12-02: 2 [IU] via SUBCUTANEOUS

## 2022-12-01 MED ORDER — ACETAMINOPHEN 650 MG RE SUPP: 650.0000 mg | Freq: Four times a day (QID) | RECTAL | Status: DC | PRN

## 2022-12-01 MED ORDER — ASPIRIN 81 MG PO TBEC
81.0000 mg | DELAYED_RELEASE_TABLET | Freq: Every day | ORAL | Status: DC
  Administered 2022-12-01 – 2022-12-02 (×2): 81 mg via ORAL
  Filled 2022-12-01 (×2): qty 1

## 2022-12-01 NOTE — ED Triage Notes (Signed)
Pt here from [facility] surgical center with c/o afib and low b/p after foot surgery  hx of afib and ablation , pt received 2 liters of fluid before arrival

## 2022-12-01 NOTE — Telephone Encounter (Signed)
Received a call from the outpatient surgical center that this patient had foot surgery.  Postoperatively, his heart rate was in the low 30s.  It was reported as atrial fibrillation with slow ventricular response.  The QRS complex is still narrow.  The patient was asymptomatic and denies lightheadedness.  He is unable to bear weight on his foot right now.  I advised him to see if his heart rate will come up to greater than 35 with some more time post procedure.  If he has any symptoms, particularly lightheadedness or syncope, he needs to go to the emergency room.  We will facilitate an earlier appointment than what is scheduled in November.

## 2022-12-01 NOTE — Progress Notes (Signed)
Postop medications sent

## 2022-12-01 NOTE — Telephone Encounter (Signed)
Dr. Rhona Leavens, anesthesiologist,  is calling requesting to speak to DOD in regards to patient who is just out of surgery. Transferred to Dr. Eldridge Dace.

## 2022-12-01 NOTE — ED Provider Notes (Signed)
Accepted handoff at shift change from [facility]. Please see prior provider note for more detail.   Briefly: Patient is 72 y.o.   DDX: concern for paroxysmal Afib, recently had foot surgery just this morning, he is off of Xarelto and aspirin for the surgery since this weekend.  EKG seems to be consistent with slow A-fib not heart block.  He has been asymptomatic, not requiring atropine or pacing.  Spoke with Rosann Auerbach with cardiology who had recommended medicine admission, spoke with the medicine team who recommended that he have a proper cardiology evaluation in person as well as lab work to come back prior to admission.  He is pending cardiology consult and the labs, unless heart rate markedly improved likely to require admission for significant bradycardia.  Plan: I interpreted CBC, BMP, troponin x 1 and magnesium which are all for the most part overall unremarkable, very mild thrombocytopenia, platelets 147.  Mild hyperglycemia glucose 122 in context of nonfasting lab values.  Mild bicarb deficit, CO2 20.  I received a message from the cardiology team, Laverda Page, NP who recommended overnight admission after talking to the patient and assessing at bedside.  I spoke with Dr. Jacqulyn Bath with the hospitalist service who agrees to admission at this time.      RISR  EDTHIS    Olene Floss, PA-C 12/01/22 1654    Lonell Grandchild, MD 12/01/22 2100

## 2022-12-01 NOTE — Telephone Encounter (Signed)
Patient currently in ED.

## 2022-12-01 NOTE — ED Provider Notes (Signed)
[place] EMERGENCY DEPARTMENT AT [facility] Provider Note   CSN: [id number] Arrival date & time: 12/01/22  1402     History  Chief Complaint  Patient presents with   Bradycardia    Carlos Gutierrez is a 72 y.o. male with a past medical history significant for A-fib who presents today from outpatient surgical center post right foot surgery for bradycardia.  Patient denies any lightheadedness, dizziness, syncope.  Patient states he was sent because postoperatively his heart rate was in the low 30s, outpatient surgical center contacted Dr.Varanasi, cardiologist who recommended he come to the ER.  Patient states he has been out of A-fib since May 2022.  Patient states he has been off antiarrhythmics since May 2023.  He has been off his Xarelto since Sunday d/t surgery.  Patient has no complaints at this time. HPI     Home Medications Prior to Admission medications   Medication Sig Start Date End Date Taking? Authorizing Provider  acetaminophen (TYLENOL) 500 MG tablet Take 1,000 mg by mouth as needed for mild pain, moderate pain or headache.     [provider]  Ascorbic Acid (VITAMIN C) 1000 MG tablet Take 1,000 mg by mouth in the morning.    [provider]  aspirin EC 81 MG tablet Take 1 tablet (81 mg total) by mouth daily. 05/07/19   Rodolph Bong, MD  atorvastatin (LIPITOR) 80 MG tablet Take 1 tablet (80 mg total) by mouth daily at 6 PM. 06/18/22   Corky Crafts, MD  cephALEXin (KEFLEX) 500 MG capsule Take 1 capsule (500 mg total) by mouth 3 (three) times daily. 12/01/22   Vivi Barrack, DPM  cetirizine (ZYRTEC) 10 MG tablet Take 10 mg by mouth at bedtime as needed for allergies.    [provider]  Cholecalciferol (VITAMIN D3) 50 MCG (2000 UT) TABS Take 2,000 Units by mouth in the morning.    [provider]  clindamycin (CLEOCIN T) 1 % SWAB Apply 1 application topically 2 (two) times daily.    [provider]   glipiZIDE (GLUCOTROL XL) 2.5 MG 24 hr tablet Take 2.5 mg by mouth daily with breakfast.    [provider]  loratadine (CLARITIN) 10 MG tablet Take 10 mg by mouth daily as needed for allergies.    [provider]  melatonin 5 MG TABS Take 5 mg by mouth at bedtime.    [provider]  Multiple Vitamin (MULTIVITAMIN WITH MINERALS) TABS tablet Take 1 tablet by mouth in the morning.    [provider]  Multiple Vitamins-Minerals (EYE VITAMINS PO) Take 1 tablet by mouth in the morning and at bedtime. Focus Select Zinc Free (    [provider]  nitroGLYCERIN (NITROSTAT) 0.4 MG SL tablet Place 1 tablet (0.4 mg total) under the tongue every 5 (five) minutes x 3 doses as needed for chest pain. 02/18/21   Graciella Freer, PA-C  olmesartan (BENICAR) 40 MG tablet Take 1 tablet (40 mg total) by mouth daily. 12/01/22   Tereso Newcomer T, PA-C  oxyCODONE-acetaminophen (PERCOCET/ROXICET) 5-325 MG tablet Take 1-2 tablets by mouth every 6 (six) hours as needed for severe pain. 12/01/22   Vivi Barrack, DPM  pantoprazole (PROTONIX) 40 MG tablet Take 1 tablet (40 mg total) by mouth 2 (two) times daily. 08/18/22   Corky Crafts, MD  pantoprazole (PROTONIX) 40 MG tablet Take 1 tablet (40 mg total) by mouth 2 (two) times daily. 06/21/22  Corky Crafts, MD  promethazine (PHENERGAN) 25 MG tablet Take 1 tablet (25 mg total) by mouth every 8 (eight) hours as needed for nausea or vomiting. 12/01/22   Vivi Barrack, DPM  rivaroxaban (XARELTO) 20 MG TABS tablet TAKE 1 TABLET ONCE DAILY WITH DINNER. 07/30/22   Lanier Prude, MD  sildenafil (VIAGRA) 50 MG tablet Take 25 mg by mouth as needed. 02/18/22   [provider]      Allergies    Metformin and related    Review of Systems   Review of Systems  Constitutional:  Negative for chills, fatigue and fever.  HENT:  Negative for ear pain, hearing loss and sore throat.   Eyes:  Negative for pain  and visual disturbance.  Respiratory:  Negative for cough and shortness of breath.   Cardiovascular:  Negative for chest pain.  Gastrointestinal:  Negative for abdominal pain, diarrhea, nausea and vomiting.  Musculoskeletal:  Negative for arthralgias and back pain.  Skin:  Negative for color change and rash.  Neurological:  Negative for dizziness, syncope, light-headedness and headaches.  All other systems reviewed and are negative.   Physical Exam Updated Vital Signs BP (!) 167/92   Pulse (!) 49   Temp 97.7 F (36.5 C)   Resp 14   SpO2 100%  Physical Exam Constitutional:      General: He is not in acute distress.    Appearance: He is not ill-appearing or toxic-appearing.  HENT:     Head: Normocephalic and atraumatic.  Eyes:     Pupils: Pupils are equal, round, and reactive to light.  Cardiovascular:     Rate and Rhythm: Bradycardia present. Rhythm irregular.     Pulses: Normal pulses.     Heart sounds: No murmur heard. Pulmonary:     Effort: No respiratory distress.     Breath sounds: Normal breath sounds.  Abdominal:     General: There is no distension.     Tenderness: There is no abdominal tenderness.  Musculoskeletal:        General: No swelling.     Comments: Boot on R foot from surgery today   Skin:    General: Skin is warm and dry.     Capillary Refill: Capillary refill takes 2 to 3 seconds.     Findings: No bruising.  Neurological:     General: No focal deficit present.  Psychiatric:        Mood and Affect: Mood normal.     ED Results / Procedures / Treatments   Labs (all labs ordered are listed, but only abnormal results are displayed) Labs Reviewed  BASIC METABOLIC PANEL  CBC  MAGNESIUM  TROPONIN I (HIGH SENSITIVITY)    EKG EKG Interpretation Date/Time:  Wednesday December 01 2022 14:49:38 EDT Ventricular Rate:  46 PR Interval:  329 QRS Duration:  111 QT Interval:  484 QTC Calculation: 455 R Axis:   75  Text Interpretation: Atrial  fibrillation Bradycardia Confirmed by Alvester Chou [phone]) on 12/01/2022 2:52:05 PM  Radiology No results found.  Procedures Procedures    Medications Ordered in ED Medications - No data to display  ED Course/ Medical Decision Making/ A&P Clinical Course as of 12/01/22 1501  Wed Dec 01, 2022  1430 This is a 72 year old male with a history of paroxysmal A-fib status post ablation, DVT on Xarelto and aspirin, presenting to the ED with concern for bradycardia.  Patient reports his typical resting heart rate is between 50 and 60 bpm.  He underwent a podiatry orthopedic procedure today and after his anesthesia woke up and was found to have lightheadedness and significant bradycardia.  He was advised to come to the ED.  He says he feels better now.  On exam he is afebrile.  His heart rate appears to be averaging 30-40 bpm on telemetry.  On EKG is uncertain whether this is A-fib with bradycardia or a second-degree heart block.  The patient reports he has been holding his Xarelto for the past 4 days per instructions from his surgeons.  He denies chest pain or difficulty breathing.  He is pending a metabolic lab workup including electrolyte workup, he is on telemetry monitoring, currently asymptomatic with slightly hypertensive blood pressure and not requiring pacing or atropine.  But we will consult with cardiology given his history [MT]  1452 Repeat ECG appears more consistent with slow A Fib, not heart block.  HR 46 [MT]    Clinical Course User Index [MT] Trifan, Kermit Balo, MD                                 Medical Decision Making Amount and/or Complexity of Data Reviewed Labs: ordered. ECG/medicine tests: ordered.  This patient presents to the ED with chief complaint(s) of bradycardia with pertinent past medical history of A-fib which further complicates the presenting complaint. The complaint involves an extensive differential diagnosis and also carries with it a high risk of complications  and morbidity.    The differential diagnosis includes A-fib, AV heart block  Additional history obtained: Additional history obtained from significant other Records reviewed previous admission documents  ED Course and Reassessment:  Pacer pads placed on patient.  Patient placed on cardiac monitor. Labs Ordered: EKG CBC CMP Troponin Magnesium   Consultation: - Consulted or discussed management/test interpretation w/ external professional: Cardiology, hospitalist  Consideration for admission or further workup: Considered for admission due to heart rate of 29 at rest and EKG showing slow Afib.  Patient handed off to PA Prosperi, patient pending labs, cardiology consult, and admission to hospitalist.        Final Clinical Impression(s) / ED Diagnoses Final diagnoses:  None    Rx / DC Orders ED Discharge Orders     None         Dolphus Jenny, PA-C 12/01/22 1521    Terald Sleeper, MD 12/01/22 1529

## 2022-12-01 NOTE — Consult Note (Addendum)
Cardiology Consultation   Patient ID: JESIEL LOBATO MRN: [id number]; DOB: [birth year]  Admit date: 12/01/2022 Date of Consult: 12/01/2022  PCP:  Farris Has, MD   [facility] Providers Cardiologist:  Lesleigh Noe, MD (Inactive)  Electrophysiologist:  Lanier Prude, MD    Patient Profile:   Carlos Gutierrez is a 72 y.o. male with a hx of moderate nonobstructive CAD, persistent atrial fibrillation (ablation 07/2020), atrial flutter (ablation 2003) hypertension, DM who is being seen 12/01/2022 for the evaluation of bradycardia at the request of Dr Suezanne Jacquet.  History of Present Illness:   [name] is a 72 year old male with past medical history noted above.  He has been followed by Dr. Lalla Brothers as an outpatient.  He was previously followed by Dr. Katrinka Blazing.  Underwent cardiac catheterization 04/2019 with 40% left main, 50% proximal LAD, 65% ramus, 60% OM 1, 40% proximal RCA, 70% distal RCA which was treated medically.  PET CT 07/2021 low risk study.  He has been maintained on Tikosyn for persistent atrial fibrillation as well as Xarelto.  Last seen in the office 02/2022 with Dr. Lalla Brothers.  His Tikosyn had been stopped 07/2021 and overall had done well.  After further discussion decision was made to continue his anticoagulation given his CHA2DS2-VASc of 4.   He presented on 9/18 to undergo foot surgery with Dr. Loreta Ave. Developed bradycardia and hypotension with anesthesia with HRs in the 30s. Case was discussed with office DOD with recommendations to evaluate in the ED if heart rates did not improve.   In the ED his labs showed sodium 137, potassium 4.3, creatinine 1.04, mag 2.1, high-sensitivity troponin 10, WBC 5.7, hemoglobin 14.7.  Initial EKG appears to be second-degree AV block type II.  Review of telemetry shows episodes of bradycardia with heart rate dropping as low as 29.  Intermittent episodes of second-degree AV block type I and type II.  Heart rate has since recovered,  back in sinus bradycardia with rates in the 50s.  Overall he was asymptomatic during these episodes.   Past Medical History:  Diagnosis Date   ACL tear    RIGHT KNEE   Atrial fibrillation (HCC) 2012   Rare occurrences. On ASA & Flecainide prn   Atrial flutter (HCC)    s/p ablation 2008. On Flecainide prn.   Broken leg 1968   Concussion 1966   History of 4   Diabetes mellitus without complication (HCC)    DVT (deep venous thrombosis) (HCC)    LEFT LOWER LEG   Dyslipidemia    Essential hypertension, benign    Insomnia    Low back pain    Obesity (BMI 30-39.9)    Obstructive sleep apnea on CPAP    Mild   PAF (paroxysmal atrial fibrillation) (HCC)    Rare occurrences. On ASA & Flecainide prn   Pure hypercholesterolemia    On Lipitor    Right shoulder pain    Intermittent   Status post ablation of atrial flutter 2008   Umbilical hernia     Past Surgical History:  Procedure Laterality Date   ablation for atrial flutter  01/2002   ATRIAL FIBRILLATION ABLATION N/A 07/18/2020   Procedure: ATRIAL FIBRILLATION ABLATION;  Surgeon: Lanier Prude, MD;  Location: [facility] INVASIVE CV LAB;  Service: Cardiovascular;  Laterality: N/A;   CARDIAC CATHETERIZATION  12/26/01   CARDIAC CATHETERIZATION  04/18/2019   COLONOSCOPY WITH PROPOFOL N/A 03/12/2016   Procedure: COLONOSCOPY WITH PROPOFOL;  Surgeon: Jeani Hawking, MD;  Location: [facility] ENDOSCOPY;  Service: Endoscopy;  Laterality: N/A;   COLONOSCOPY WITH PROPOFOL N/A 05/05/2019   Procedure: COLONOSCOPY WITH PROPOFOL;  Surgeon: Jeani Hawking, MD;  Location: [facility] ENDOSCOPY;  Service: Endoscopy;  Laterality: N/A;   coronary angiography  1997   40% stenosis   ESOPHAGOGASTRODUODENOSCOPY (EGD) WITH PROPOFOL N/A 10/15/2016   Procedure: ESOPHAGOGASTRODUODENOSCOPY (EGD) WITH PROPOFOL;  Surgeon: Jeani Hawking, MD;  Location: [facility] ENDOSCOPY;  Service: Endoscopy;  Laterality: N/A;   ESOPHAGOGASTRODUODENOSCOPY (EGD) WITH PROPOFOL N/A 12/14/2019   Procedure:  ESOPHAGOGASTRODUODENOSCOPY (EGD) WITH PROPOFOL;  Surgeon: Jeani Hawking, MD;  Location: [facility] ENDOSCOPY;  Service: Endoscopy;  Laterality: N/A;   LEFT HEART CATH AND CORONARY ANGIOGRAPHY N/A 02/11/2017   Procedure: LEFT HEART CATH AND CORONARY ANGIOGRAPHY;  Surgeon: Lyn Records, MD;  Location: [facility] INVASIVE CV LAB;  Service: Cardiovascular;  Laterality: N/A;   LEFT HEART CATH AND CORONARY ANGIOGRAPHY N/A 04/18/2019   Procedure: LEFT HEART CATH AND CORONARY ANGIOGRAPHY;  Surgeon: Lyn Records, MD;  Location: [facility] INVASIVE CV LAB;  Service: Cardiovascular;  Laterality: N/A;   POLYPECTOMY  05/05/2019   Procedure: POLYPECTOMY;  Surgeon: Jeani Hawking, MD;  Location: [facility] ENDOSCOPY;  Service: Endoscopy;;   right knee surgery  1996   ACL, meniscus problems   UMBILICAL HERNIA REPAIR  04/2015    Inpatient Medications: Scheduled Meds:  Continuous Infusions:  PRN Meds:   Allergies:    Allergies  Allergen Reactions   Metformin And Related Nausea And Vomiting and Other (See Comments)    Dizziness and disorientation, too    Social History:   Social History   Socioeconomic History   Marital status: Married    Spouse name: Not on file   Number of children: 2   Years of education: Not on file   Highest education level: Not on file  Occupational History   Occupation: Advertising account planner: [employer]  Tobacco Use   Smoking status: Never   Smokeless tobacco: Never  Vaping Use   Vaping status: Never Used  Substance and Sexual Activity   Alcohol use: Yes    Alcohol/week: 9.0 - 12.0 standard drinks of alcohol    Types: 7 - 10 Glasses of wine, 2 Standard drinks or equivalent per week   Drug use: No   Sexual activity: Not on file  Other Topics Concern   Not on file  Social History Narrative   Caffeine: yes, coffee 2-3 cups daily, tea-rare.    Exercise-yes, 2-3X weekly, jog/run.    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     Marital Status: Married.    Children: 2 children-grown               Social Determinants of Corporate investment banker Strain: Not on file  Food Insecurity: Not on file  Transportation Needs: Not on file  Physical Activity: Not on file  Stress: Not on file  Social Connections: Not on file  Intimate Partner Violence: Not on file    Family History:    Family History  Problem Relation Age of Onset   Hypertension Mother 97   Diabetes Mother    COPD Mother    Heart disease Mother    Heart disease Father 23   Hypertension Father    Heart failure Father    Heart attack Father    Diabetes Brother 18   Hypertension Brother 52   Heart attack Paternal Grandfather    Colon cancer Maternal Grandfather  80   Stroke Neg Hx      ROS:  Please see the history of present illness.   All other ROS reviewed and negative.     Physical Exam/Data:   Vitals:   12/01/22 1415 12/01/22 1515  BP: (!) 167/92 (!) 162/87  Pulse: (!) 49 (!) 55  Resp: 14 16  Temp: 97.7 F (36.5 C)   SpO2: 100% 100%   No intake or output data in the 24 hours ending 12/01/22 1538    02/22/2022    8:28 AM 02/02/2022    8:10 AM 09/24/2021    8:11 AM  Last 3 Weights  Weight (lbs) 230 lb 228 lb 9.6 oz 234 lb  Weight (kg) 104.327 kg 103.692 kg 106.142 kg     There is no height or weight on file to calculate BMI.  General:  Well nourished, well developed, in no acute distress HEENT: normal Neck: no JVD Vascular: No carotid bruits; Distal pulses 2+ bilaterally Cardiac:  normal S1, S2; RRR; no murmur  Lungs:  clear to auscultation bilaterally, no wheezing, rhonchi or rales  Abd: soft, nontender, no hepatomegaly  Ext: no edema Musculoskeletal:  Right in boot Skin: warm and dry  Neuro:  CNs 2-12 intact, no focal abnormalities noted Psych:  Normal affect   EKG:  The EKG was personally reviewed and demonstrates:  Second degree type II Telemetry:  Telemetry was personally reviewed and demonstrates:  Second  degree type I and II-->sinus bradycardia  Relevant CV Studies:  Echo: 04/2019  IMPRESSIONS     1. Left ventricular ejection fraction, by visual estimation, is 50 to  55%. The left ventricle has normal function. There is no left ventricular  hypertrophy.   2. Definity contrast agent was given IV to delineate the left ventricular  endocardial borders.   3. The left ventricle has no regional wall motion abnormalities.   4. Left ventricular diastolic parameters are indeterminate.   5. Global right ventricle has normal systolic function.The right  ventricular size is not well visualized. Right vetricular wall thickness  was not assessed.   6. Left atrial size was normal.   7. Right atrial size was normal.   8. The mitral valve is normal in structure. No evidence of mitral valve  regurgitation. No evidence of mitral stenosis.   9. The tricuspid valve is normal in structure. Tricuspid valve  regurgitation is trivial.  10. The aortic valve is grossly normal. Aortic valve regurgitation is not  visualized. No evidence of aortic valve sclerosis or stenosis.  11. The pulmonic valve was normal in structure. Pulmonic valve  regurgitation is trivial.  12. The inferior vena cava is normal in size with greater than 50%  respiratory variability, suggesting right atrial pressure of 3 mmHg.  13. TR signal is inadequate for assessing pulmonary artery systolic  pressure.   FINDINGS   Left Ventricle: Left ventricular ejection fraction, by visual estimation,  is 50 to 55%. The left ventricle has normal function. Definity contrast  agent was given IV to delineate the left ventricular endocardial borders.  The left ventricle has no  regional wall motion abnormalities. There is no left ventricular  hypertrophy. Left ventricular diastolic parameters are indeterminate.   Right Ventricle: The right ventricular size is not well visualized. Right  vetricular wall thickness was not assessed. Global RV  systolic function is  has normal systolic function.   Left Atrium: Left atrial size was normal in size.   Right Atrium: Right atrial size was normal  in size   Pericardium: There is no evidence of pericardial effusion.   Mitral Valve: The mitral valve is normal in structure. No evidence of  mitral valve regurgitation. No evidence of mitral valve stenosis by  observation.   Tricuspid Valve: The tricuspid valve is normal in structure. Tricuspid  valve regurgitation is trivial.   Aortic Valve: The aortic valve is grossly normal. Aortic valve  regurgitation is not visualized. The aortic valve is structurally normal,  with no evidence of sclerosis or stenosis.   Pulmonic Valve: The pulmonic valve was normal in structure. Pulmonic valve  regurgitation is trivial. Pulmonic regurgitation is trivial.   Aorta: The aortic root, ascending aorta and aortic arch are all  structurally normal, with no evidence of dilitation or obstruction.   Venous: The inferior vena cava was not well visualized. The inferior vena  cava is normal in size with greater than 50% respiratory variability,  suggesting right atrial pressure of 3 mmHg.   IAS/Shunts: The interatrial septum was not well visualized.   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      Laboratory Data:  High Sensitivity Troponin:  No results for input(s): "TROPONINIHS" in the last 720 hours.   ChemistryNo results for input(s): "NA", "K", "CL", "CO2", "GLUCOSE", "BUN", "CREATININE", "CALCIUM", "MG", "GFRNONAA", "GFRAA", "ANIONGAP" in the last 168 hours.  No results for input(s): "PROT", "ALBUMIN", "AST", "ALT", "ALKPHOS", "BILITOT" in the last 168 hours. Lipids No results for input(s): "CHOL", "TRIG", "HDL", "LABVLDL", "LDLCALC", "CHOLHDL" in the last 168 hours.  HematologyNo results for input(s): "WBC", "RBC", "HGB", "HCT", "MCV", "MCH", "MCHC", "RDW", "PLT" in the last 168 hours. Thyroid No results for input(s):  "TSH", "FREET4" in the last 168 hours.  BNPNo results for input(s): "BNP", "PROBNP" in the last 168 hours.  DDimer No results for input(s): "DDIMER" in the last 168 hours.   Radiology/Studies:  No results found.   Assessment and Plan:   MAYS BUSSCHER is a 72 y.o. male with a hx of moderate nonobstructive CAD, persistent atrial fibrillation (ablation 07/2020), atrial flutter (ablation 2003) hypertension, DM who is being seen 12/01/2022 for the evaluation of bradycardia at the request of Dr Suezanne Jacquet.  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  Developed significant bradycardia with anesthesia and hypotension.  EKG in the ED shows second-degree AV block type II.  Review of telemetry shows both second-degree AV block type I and type II.  Heart rate has since recovered, sinus bradycardia in the 50s which is his baseline -- With these episodes would plan to observe overnight on telemetry to ensure no further episodes -- likely plan for outpatient cardiac monitor at discharge  Nonobstructive CAD -- Prior cath 2021 as noted above, no anginal symptoms -- on ASA, statin   Hypertension -- blood pressures elevated in the ED -- on benicar PTA  Per primary Foot surgery DM GERD  Risk Assessment/Risk Scores:   CHA2DS2-VASc Score = 4   This indicates a 4.8% annual risk of stroke. The patient's score is based upon: CHF History: 0 HTN History: 1 Diabetes History: 1 Stroke History: 0 Vascular Disease History: 1 Age Score: 1 Gender Score: 0    For questions or updates, please contact [facility] Please consult [email or web address] for contact info under    Signed, Laverda Page, NP  12/01/2022 3:38 PM  I have personally seen and examined this patient. I agree  with the assessment and plan as outlined above.  72 yo male with history of CAD, persistent atrial fib s/p  ablation in 2022, HTN and DM who presented to the ED post foot surgery with bradycardia. He developed bradycardia during his surgery today with reported heart rates in the 30s. EKG on arrival to the ED with Mobitz 2 block. He is currently sinus bradycardia in the 50s. He has no symptoms.  Labs reviewed EKG reviewed by me. See above My exam: NAD, ZO:[id number]. Lungs: Clear bilaterally Ext: no edema  Plan: High grade AV block during surgery/anesthesia. Monitor on telemetry overnight. Since he has been asymptomatic and has now recovered to sinus brady, he most likely will not need a pacemaker. He will need a cardiac monitor at time of discharge.   Verne Carrow, MD 12/01/2022 4:59 PM

## 2022-12-01 NOTE — H&P (Addendum)
History and Physical    JAMESJOSEPH BAUN NWG:[id number] DOB: 10-Oct-1950 DOA: 12/01/2022  PCP: Farris Has, MD  Patient coming from: Surgical center  I have personally briefly reviewed patient's old medical records in [facility]  Chief Complaint: Bradycardia  HPI: Carlos Gutierrez is a 72 y.o. male with medical history significant of CAD, paroxysmal atrial fibrillation on anticoagulation, hypertension, type 2 diabetes mellitus, hyperlipidemia, GERD who was sent to the ED from surgical center after having foot surgery for tachycardia.  According to the patient, he has not been taking his aspirin or Xarelto since Sunday in anticipation of the surgery which actually happened today however postoperatively, while he was still in PACU, his heart rate was in 20s but he was not having any symptoms.  His podiatrist called cardiology and they recommended to observe him in PACU for some time to see if his heart rate improves, if not then to send to the ED.  His heart rate improved to low 30s, patient remained asymptomatic and then sent to the ED.  Patient seen and examined in the ED by myself.  His girlfriend is at the bedside.  Patient is having heart rate around 40s.  He is totally asymptomatic.  At baseline his heart rate remains anywhere from 55-65.  Patient is not on any rate control medications or any antiarrhythmic.  ED Course: Heart rate between 49-55.  Blood pressure slightly elevated but acceptable.  Mild thrombocytopenia but other labs are normal including troponin and magnesium.  Per cardiology recommendation, patient is being admitted under hospital service for observation.  Review of Systems: As per HPI otherwise negative.    Past Medical History:  Diagnosis Date   ACL tear    RIGHT KNEE   Atrial fibrillation (HCC) 2012   Rare occurrences. On ASA & Flecainide prn   Atrial flutter (HCC)    s/p ablation 2008. On Flecainide prn.   Broken leg 1968   Concussion 1966   History of 4    Diabetes mellitus without complication (HCC)    DVT (deep venous thrombosis) (HCC)    LEFT LOWER LEG   Dyslipidemia    Essential hypertension, benign    Insomnia    Low back pain    Obesity (BMI 30-39.9)    Obstructive sleep apnea on CPAP    Mild   PAF (paroxysmal atrial fibrillation) (HCC)    Rare occurrences. On ASA & Flecainide prn   Pure hypercholesterolemia    On Lipitor    Right shoulder pain    Intermittent   Status post ablation of atrial flutter 2008   Umbilical hernia     Past Surgical History:  Procedure Laterality Date   ablation for atrial flutter  01/2002   ATRIAL FIBRILLATION ABLATION N/A 07/18/2020   Procedure: ATRIAL FIBRILLATION ABLATION;  Surgeon: Lanier Prude, MD;  Location: [facility] INVASIVE CV LAB;  Service: Cardiovascular;  Laterality: N/A;   CARDIAC CATHETERIZATION  12/26/01   CARDIAC CATHETERIZATION  04/18/2019   COLONOSCOPY WITH PROPOFOL N/A 03/12/2016   Procedure: COLONOSCOPY WITH PROPOFOL;  Surgeon: Jeani Hawking, MD;  Location: [facility] ENDOSCOPY;  Service: Endoscopy;  Laterality: N/A;   COLONOSCOPY WITH PROPOFOL N/A 05/05/2019   Procedure: COLONOSCOPY WITH PROPOFOL;  Surgeon: Jeani Hawking, MD;  Location: [facility] ENDOSCOPY;  Service: Endoscopy;  Laterality: N/A;   coronary angiography  1997   40% stenosis   ESOPHAGOGASTRODUODENOSCOPY (EGD) WITH PROPOFOL N/A 10/15/2016   Procedure: ESOPHAGOGASTRODUODENOSCOPY (EGD) WITH PROPOFOL;  Surgeon: Jeani Hawking, MD;  Location: [facility]  ENDOSCOPY;  Service: Endoscopy;  Laterality: N/A;   ESOPHAGOGASTRODUODENOSCOPY (EGD) WITH PROPOFOL N/A 12/14/2019   Procedure: ESOPHAGOGASTRODUODENOSCOPY (EGD) WITH PROPOFOL;  Surgeon: Jeani Hawking, MD;  Location: [facility] ENDOSCOPY;  Service: Endoscopy;  Laterality: N/A;   LEFT HEART CATH AND CORONARY ANGIOGRAPHY N/A 02/11/2017   Procedure: LEFT HEART CATH AND CORONARY ANGIOGRAPHY;  Surgeon: Lyn Records, MD;  Location: [facility] CV LAB;  Service: Cardiovascular;  Laterality: N/A;   LEFT HEART CATH  AND CORONARY ANGIOGRAPHY N/A 04/18/2019   Procedure: LEFT HEART CATH AND CORONARY ANGIOGRAPHY;  Surgeon: Lyn Records, MD;  Location: [facility] CV LAB;  Service: Cardiovascular;  Laterality: N/A;   POLYPECTOMY  05/05/2019   Procedure: POLYPECTOMY;  Surgeon: Jeani Hawking, MD;  Location: [facility] ENDOSCOPY;  Service: Endoscopy;;   right knee surgery  1996   ACL, meniscus problems   UMBILICAL HERNIA REPAIR  04/2015     reports that he has never smoked. He has never used smokeless tobacco. He reports current alcohol use of about 9.0 - 12.0 standard drinks of alcohol per week. He reports that he does not use drugs.  Allergies  Allergen Reactions   Metformin And Related Nausea And Vomiting and Other (See Comments)    Dizziness and disorientation, too    Family History  Problem Relation Age of Onset   Hypertension Mother 46   Diabetes Mother    COPD Mother    Heart disease Mother    Heart disease Father 82   Hypertension Father    Heart failure Father    Heart attack Father    Diabetes Brother 55   Hypertension Brother 49   Heart attack Paternal Grandfather    Colon cancer Maternal Grandfather 60   Stroke Neg Hx     Prior to Admission medications   Medication Sig Start Date End Date Taking? Authorizing Provider  acetaminophen (TYLENOL) 500 MG tablet Take 1,000 mg by mouth as needed for mild pain, moderate pain or headache.     [provider]  Ascorbic Acid (VITAMIN C) 1000 MG tablet Take 1,000 mg by mouth in the morning.    [provider]  aspirin EC 81 MG tablet Take 1 tablet (81 mg total) by mouth daily. 05/07/19   Rodolph Bong, MD  atorvastatin (LIPITOR) 80 MG tablet Take 1 tablet (80 mg total) by mouth daily at 6 PM. 06/18/22   Corky Crafts, MD  cephALEXin (KEFLEX) 500 MG capsule Take 1 capsule (500 mg total) by mouth 3 (three) times daily. 12/01/22   Vivi Barrack, DPM  cetirizine (ZYRTEC) 10 MG tablet Take 10 mg by mouth at bedtime as needed for  allergies.    [provider]  Cholecalciferol (VITAMIN D3) 50 MCG (2000 UT) TABS Take 2,000 Units by mouth in the morning.    [provider]  clindamycin (CLEOCIN T) 1 % SWAB Apply 1 application topically 2 (two) times daily.    [provider]  glipiZIDE (GLUCOTROL XL) 2.5 MG 24 hr tablet Take 2.5 mg by mouth daily with breakfast.    [provider]  loratadine (CLARITIN) 10 MG tablet Take 10 mg by mouth daily as needed for allergies.    [provider]  melatonin 5 MG TABS Take 5 mg by mouth at bedtime.    [provider]  Multiple Vitamin (MULTIVITAMIN WITH MINERALS) TABS tablet Take 1 tablet by mouth in the morning.    [provider]  Multiple Vitamins-Minerals (EYE VITAMINS PO) Take  1 tablet by mouth in the morning and at bedtime. Focus Select Zinc Free (    [provider]  nitroGLYCERIN (NITROSTAT) 0.4 MG SL tablet Place 1 tablet (0.4 mg total) under the tongue every 5 (five) minutes x 3 doses as needed for chest pain. 02/18/21   Graciella Freer, PA-C  olmesartan (BENICAR) 40 MG tablet Take 1 tablet (40 mg total) by mouth daily. 12/01/22   Tereso Newcomer T, PA-C  oxyCODONE-acetaminophen (PERCOCET/ROXICET) 5-325 MG tablet Take 1-2 tablets by mouth every 6 (six) hours as needed for severe pain. 12/01/22   Vivi Barrack, DPM  pantoprazole (PROTONIX) 40 MG tablet Take 1 tablet (40 mg total) by mouth 2 (two) times daily. 08/18/22   Corky Crafts, MD  pantoprazole (PROTONIX) 40 MG tablet Take 1 tablet (40 mg total) by mouth 2 (two) times daily. 06/21/22   Corky Crafts, MD  promethazine (PHENERGAN) 25 MG tablet Take 1 tablet (25 mg total) by mouth every 8 (eight) hours as needed for nausea or vomiting. 12/01/22   Vivi Barrack, DPM  rivaroxaban (XARELTO) 20 MG TABS tablet TAKE 1 TABLET ONCE DAILY WITH DINNER. 07/30/22   Lanier Prude, MD  sildenafil (VIAGRA) 50 MG tablet Take 25 mg by mouth as  needed. 02/18/22   [provider]    Physical Exam: Vitals:   12/01/22 1415 12/01/22 1515  BP: (!) 167/92 (!) 162/87  Pulse: (!) 49 (!) 55  Resp: 14 16  Temp: 97.7 F (36.5 C)   SpO2: 100% 100%    Constitutional: NAD, calm, comfortable Vitals:   12/01/22 1415 12/01/22 1515  BP: (!) 167/92 (!) 162/87  Pulse: (!) 49 (!) 55  Resp: 14 16  Temp: 97.7 F (36.5 C)   SpO2: 100% 100%   Eyes: PERRL, lids and conjunctivae normal ENMT: Mucous membranes are moist. Posterior pharynx clear of any exudate or lesions.Normal dentition.  Neck: normal, supple, no masses, no thyromegaly Respiratory: clear to auscultation bilaterally, no wheezing, no crackles. Normal respiratory effort. No accessory muscle use.  Cardiovascular: Irregularly irregular rate and rhythm, no murmurs / rubs / gallops. No extremity edema. 2+ pedal pulses. No carotid bruits.  Abdomen: no tenderness, no masses palpated. No hepatosplenomegaly. Bowel sounds positive.  Musculoskeletal: no clubbing / cyanosis. No joint deformity upper and lower extremities. Good ROM, no contractures. Normal muscle tone.  Has surgical boot in the right foot. Skin: no rashes, lesions, ulcers. No induration Neurologic: CN 2-12 grossly intact. Sensation intact, DTR normal. Strength 5/5 in all 4.  Psychiatric: Normal judgment and insight. Alert and oriented x 3. Normal mood.    Labs on Admission: I have personally reviewed following labs and imaging studies  CBC: Recent Labs  Lab 12/01/22 1505  WBC 5.7  HGB 14.7  HCT 45.6  MCV 92.9  PLT 147*   Basic Metabolic Panel: Recent Labs  Lab 12/01/22 1505  NA 137  K 4.3  CL 109  CO2 20*  GLUCOSE 122*  BUN 16  CREATININE 1.04  CALCIUM 9.1  MG 2.1   GFR: CrCl cannot be calculated (Unknown ideal weight.). Liver Function Tests: No results for input(s): "AST", "ALT", "ALKPHOS", "BILITOT", "PROT", "ALBUMIN" in the last 168 hours. No results for input(s): "LIPASE", "AMYLASE" in the  last 168 hours. No results for input(s): "AMMONIA" in the last 168 hours. Coagulation Profile: No results for input(s): "INR", "PROTIME" in the last 168 hours. Cardiac Enzymes: No results for input(s): "CKTOTAL", "CKMB", "CKMBINDEX", "TROPONINI" in the  last 168 hours. BNP (last 3 results) No results for input(s): "PROBNP" in the last 8760 hours. HbA1C: No results for input(s): "HGBA1C" in the last 72 hours. CBG: No results for input(s): "GLUCAP" in the last 168 hours. Lipid Profile: No results for input(s): "CHOL", "HDL", "LDLCALC", "TRIG", "CHOLHDL", "LDLDIRECT" in the last 72 hours. Thyroid Function Tests: No results for input(s): "TSH", "T4TOTAL", "FREET4", "T3FREE", "THYROIDAB" in the last 72 hours. Anemia Panel: No results for input(s): "VITAMINB12", "FOLATE", "FERRITIN", "TIBC", "IRON", "RETICCTPCT" in the last 72 hours. Urine analysis:    Component Value Date/Time   COLORURINE YELLOW 04/21/2019 2010   APPEARANCEUR CLEAR 04/21/2019 2010   LABSPEC 1.009 04/21/2019 2010   PHURINE 6.0 04/21/2019 2010   GLUCOSEU NEGATIVE 04/21/2019 2010   HGBUR NEGATIVE 04/21/2019 2010   BILIRUBINUR NEGATIVE 04/21/2019 2010   KETONESUR NEGATIVE 04/21/2019 2010   PROTEINUR NEGATIVE 04/21/2019 2010   NITRITE NEGATIVE 04/21/2019 2010   LEUKOCYTESUR NEGATIVE 04/21/2019 2010    Radiological Exams on Admission: No results found.  EKG: Independently reviewed.  Atrial fibrillation with rates in 40s.  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  He is asymptomatic.  Will  admit for observation overnight.  He says that his podiatry has cleared him to resume Xarelto tonight.  Will resume that.  Check TSH.  Nonobstructive CAD: Prior cath in 2021.  Currently asymptomatic.  Statin and aspirin.  Essential hypertension: Slightly elevated in the ED.  Resume PTA medications.  Status post right foot surgery: Will resume pain medications and cephalexin as prescribed by podiatry.  Type 2 diabetes mellitus: On oral antiglycemic medications at home which I am going to hold.  Start on SSI.  Hyperlipidemia: Resume statin  GERD: Resume PPI  DVT prophylaxis: SCDs Start: 12/01/22 1650 Code Status: Full code Family Communication: Girlfriend present at bedside.  Plan of care discussed with patient in length and he verbalized understanding and agreed with it. Disposition Plan: Potential discharge tomorrow Consults called: Cardiology has seen the patient and will follow  Hughie Closs MD [facility]  *Please note that this is a verbal dictation therefore any spelling or grammatical errors are due to the "Dragon [employer] One" system interpretation.  Please page via Amion and do not message via secure chat for urgent patient care matters. Secure chat can be used for non urgent patient care matters. 12/01/2022, 5:04 PM  To contact the attending provider between 7A-7P or the covering provider during after hours 7P-7A, please log into the web site [email or web address]

## 2022-12-01 NOTE — Progress Notes (Signed)
Patient underwent surgical correction of the right with hammertoe repair. Patient was stable during the procedure however in PACU he went to A-fib and bradycardia.  He was transferred to the hospital.   I dilated the patient emergency room.  He has good cap refill time to the toes.  Pain currently controlled his nerve block is still intact.  He has pain medication ordered to take as needed.  Encouraged elevation.  He is nonweightbearing.  He should have crutches already and I will order him a knee scooter.  Will follow-up with him next week as an outpatient but please let us know if we can be of assistance during this admission.  Patients girlfriend was at bedside.

## 2022-12-02 ENCOUNTER — Other Ambulatory Visit: Payer: Self-pay | Admitting: Cardiology

## 2022-12-02 ENCOUNTER — Other Ambulatory Visit: Payer: Self-pay | Admitting: Podiatry

## 2022-12-02 ENCOUNTER — Inpatient Hospital Stay (HOSPITAL_COMMUNITY)
Admit: 2022-12-02 | Discharge: 2022-12-02 | Disposition: A | Discharge status: Home / Self Care | Payer: BC Managed Care – PPO | Attending: Cardiology | Admitting: Cardiology

## 2022-12-02 DIAGNOSIS — I441 Atrioventricular block, second degree: Secondary | ICD-10-CM | POA: Diagnosis not present

## 2022-12-02 DIAGNOSIS — G4733 Obstructive sleep apnea (adult) (pediatric): Secondary | ICD-10-CM

## 2022-12-02 DIAGNOSIS — I2510 Atherosclerotic heart disease of native coronary artery without angina pectoris: Secondary | ICD-10-CM

## 2022-12-02 DIAGNOSIS — I10 Essential (primary) hypertension: Secondary | ICD-10-CM | POA: Diagnosis not present

## 2022-12-02 DIAGNOSIS — R001 Bradycardia, unspecified: Secondary | ICD-10-CM | POA: Diagnosis not present

## 2022-12-02 DIAGNOSIS — E669 Obesity, unspecified: Secondary | ICD-10-CM

## 2022-12-02 DIAGNOSIS — M2012 Hallux valgus (acquired), left foot: Secondary | ICD-10-CM

## 2022-12-02 DIAGNOSIS — I4891 Unspecified atrial fibrillation: Secondary | ICD-10-CM | POA: Diagnosis not present

## 2022-12-02 DIAGNOSIS — E119 Type 2 diabetes mellitus without complications: Secondary | ICD-10-CM

## 2022-12-02 LAB — COMPREHENSIVE METABOLIC PANEL
ALT: 28 U/L (ref 0–44)
AST: 27 U/L (ref 15–41)
Albumin: 3.8 g/dL (ref 3.5–5.0)
Alkaline Phosphatase: 69 U/L (ref 38–126)
Anion gap: 8 (ref 5–15)
BUN: 15 mg/dL (ref 8–23)
CO2: 23 mmol/L (ref 22–32)
Calcium: 9 mg/dL (ref 8.9–10.3)
Chloride: 105 mmol/L (ref 98–111)
Creatinine, Ser: 1.14 mg/dL (ref 0.61–1.24)
GFR, Estimated: >60 mL/min (ref >60)
Glucose, Bld: 114 mg/dL — ABNORMAL HIGH (ref 70–99)
Potassium: 4 mmol/L (ref 3.5–5.1)
Sodium: 136 mmol/L (ref 135–145)
Total Bilirubin: 1.3 mg/dL — ABNORMAL HIGH (ref 0.3–1.2)
Total Protein: 6.6 g/dL (ref 6.5–8.1)

## 2022-12-02 LAB — CBC
HCT: 45.3 % (ref 39.0–52.0)
Hemoglobin: 14.9 g/dL (ref 13.0–17.0)
MCH: 30 pg (ref 26.0–34.0)
MCHC: 32.9 g/dL (ref 30.0–36.0)
MCV: 91.3 fL (ref 80.0–100.0)
Platelets: 123 10*3/uL — ABNORMAL LOW (ref 150–400)
RBC: 4.96 MIL/uL (ref 4.22–5.81)
RDW: 13.8 % (ref 11.5–15.5)
WBC: 6.6 10*3/uL (ref 4.0–10.5)
nRBC: 0 % (ref 0.0–0.2)

## 2022-12-02 LAB — TSH: TSH: 3.358 u[IU]/mL (ref 0.350–4.500)

## 2022-12-02 LAB — HEMOGLOBIN A1C
Hgb A1c MFr Bld: 6.1 % — ABNORMAL HIGH (ref 4.8–5.6)
Mean Plasma Glucose: 128.37 mg/dL

## 2022-12-02 LAB — CBG MONITORING, ED: Glucose-Capillary: 122 mg/dL — ABNORMAL HIGH (ref 70–99)

## 2022-12-02 LAB — HIV ANTIBODY (ROUTINE TESTING W REFLEX): HIV Screen 4th Generation wRfx: NONREACTIVE

## 2022-12-02 NOTE — ED Notes (Signed)
CV provider at bedside.

## 2022-12-02 NOTE — ED Notes (Signed)
IP provider at bedside.

## 2022-12-02 NOTE — Discharge Summary (Signed)
Physician Discharge Summary  Carlos Gutierrez WJX:[id number] DOB: [birth year] DOA: 12/01/2022  PCP: Farris Has, MD  Admit date: 12/01/2022 Discharge date: 12/02/2022  Admitted From: Home  Discharge disposition: Home  Recommendations for Outpatient Follow-Up:   Follow up with your primary care provider in one week.  Check CBC, BMP, magnesium in the next visit Follow-up with podiatry as has been scheduled in the clinic. Patient will be on a cardiac monitor on discharge with follow-up with cardiology. Ambulatory referral to atrial fibrillation clinic has been made.  Discharge Diagnosis:   Active Problems:   Atrial fibrillation with slow ventricular response (HCC)   OSA (obstructive sleep apnea)   HTN (hypertension)   Obesity (BMI 30-39.9)   CAD (coronary artery disease)   Type 2 diabetes mellitus without complication, without long-term current use of insulin (HCC)   Discharge Condition: Improved.  Diet recommendation: Low sodium, heart healthy.  Carbohydrate-modified.   Wound care: As per podiatry as outpatient.  Code status: Full.   History of Present Illness:   Carlos Gutierrez is a 72 y.o. male with past medical history significant of CAD, paroxysmal atrial fibrillation on anticoagulation, hypertension, type 2 diabetes mellitus, hyperlipidemia, GERD was sent to the ED from surgical center after sustaining tachycardia after foot surgery.  Patient had not been taking his aspirin or Xarelto for surgery.  While in the PACU his heart rate was  in 20s but he was not having any symptoms.  His podiatrist called cardiology and they recommended to observe him in PACU for some time to see if his heart rate improves, if not then to send to the ED.  His heart rate improved to low 30s, patient remained asymptomatic and then sent to the ED.  At baseline his heart rate remains anywhere from 55-65.  Patient is not on any rate control medications or any antiarrhythmic.  Blood pressure  slightly elevated.  Labs showed mild thrombocytopenia.  Cardiology was notified and patient was admitted to the hospital for further evaluation and treatment.  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  Continue Xarelto.  TSH of 3.3.  Plan for utpatient cardiac monitor on discharge.  Latest heart rate is in the 58.  Discussed with cardiology and okay for discharge home today.   History of nonobstructive CAD: Prior cath in 2021.  Asymptomatic.  Continue aspirin and statins.  Essential hypertension: Patient is on olmesartan 40 milligram daily at home.  Continue on discharge.  Status post right foot surgery:  Continue analgesia, cephalexin.  Podiatry to follow the patient next week.  Type 2 diabetes mellitus:  On glipizide at home.  Will resume on discharge.   Hyperlipidemia: Continue statin   GERD: Continue PPI  Disposition.  At this time, patient is stable for disposition home with outpatient PCP, podiatry and cardiology follow-up.  Medical Consultants:   Podiatry Cardiology  Procedures:    Right hammertoe repair. Subjective:   Today, patient was seen and examined at bedside.  Denies any dizziness lightheadedness shortness of breath fever chills.  Discharge Exam:   Vitals:   12/02/22 1000 12/02/22 1100  BP: (!) 143/79 (!) 147/77  Pulse: (!) 58 (!) 57  Resp:  20  Temp:  98.8 F (37.1 C)  SpO2: 96% 99%   Vitals:   12/02/22 0800 12/02/22 0805 12/02/22 1000 12/02/22 1100  BP: (!) 190/89 (!) 178/88 (!) 143/79 (!) 147/77  Pulse: (!) 57 64 (!) 58 (!) 57  Resp: 20   20  Temp: 98.8 F (37.1 C)   98.8 F (37.1 C)  TempSrc: Oral     SpO2: 99% 100% 96% 99%   There is no height or weight on file to calculate BMI.  General: Alert awake, not in obvious distress HENT: pupils equally  reacting to light,  No scleral pallor or icterus noted. Oral mucosa is moist.  Chest:   Diminished breath sounds bilaterally. No crackles or wheezes.  CVS: S1 &S2 heard. No murmur.  Bradycardia. Abdomen: Soft, nontender, nondistended.  Bowel sounds are heard.   Extremities: No cyanosis, clubbing.  Right foot with dressing in surgical boot. Psych: Alert, awake and oriented, normal mood CNS:  No cranial nerve deficits.  Power equal in all extremities.   Skin: Warm and dry.  No rashes noted.  The results of significant diagnostics from this hospitalization (including imaging, microbiology, ancillary and laboratory) are listed below for reference.     Diagnostic Studies:   No results found.   Labs:   Basic Metabolic Panel: Recent Labs  Lab 12/01/22 1505 12/02/22 0545  NA 137 136  K 4.3 4.0  CL 109 105  CO2 20* 23  GLUCOSE 122* 114*  BUN 16 15  CREATININE 1.04 1.14  CALCIUM 9.1 9.0  MG 2.1  --    GFR CrCl cannot be calculated (Unknown ideal weight.). Liver Function Tests: Recent Labs  Lab 12/02/22 0545  AST 27  ALT 28  ALKPHOS 69  BILITOT 1.3*  PROT 6.6  ALBUMIN 3.8   No results for input(s): "LIPASE", "AMYLASE" in the last 168 hours. No results for input(s): "AMMONIA" in the last 168 hours. Coagulation profile No results for input(s): "INR", "PROTIME" in the last 168 hours.  CBC: Recent Labs  Lab 12/01/22 1505 12/02/22 0545  WBC 5.7 6.6  HGB 14.7 14.9  HCT 45.6 45.3  MCV 92.9 91.3  PLT 147* 123*   Cardiac Enzymes: No results for input(s): "CKTOTAL", "CKMB", "CKMBINDEX", "TROPONINI" in the last 168 hours. BNP: Invalid input(s): "POCBNP" CBG: Recent Labs  Lab 12/01/22 2130 12/02/22 0743  GLUCAP 140* 122*   D-Dimer No results for input(s): "DDIMER" in the last 72 hours. Hgb A1c Recent Labs    12/02/22 0545  HGBA1C 6.1*   Lipid Profile No results for input(s): "CHOL", "HDL", "LDLCALC", "TRIG", "CHOLHDL", "LDLDIRECT" in the last 72  hours. Thyroid function studies Recent Labs    12/02/22 0545  TSH 3.358   Anemia work up No results for input(s): "VITAMINB12", "FOLATE", "FERRITIN", "TIBC", "IRON", "RETICCTPCT" in the last 72 hours. Microbiology No results found for this or any previous visit (from the past 240 hour(s)).   Discharge Instructions:   Discharge Instructions     Amb referral to AFIB Clinic   Complete by: As directed    Diet - low sodium heart healthy   Complete by: As directed    Diet Carb Modified   Complete by: As directed    Discharge instructions   Complete by: As directed    Follow-up with your primary care provider in 1 week.  Nonweightbearing and use crutches/ knee scooter.   Follow-up with podiatry next week as scheduled.   Continue cardiac monitor/follow-up arranged by cardiology.   Increase activity slowly   Complete by: As directed       Allergies as of 12/02/2022       Reactions  Metformin And Related Nausea And Vomiting, Other (See Comments)   Dizziness and disorientation, too        Medication List     TAKE these medications    acetaminophen 500 MG tablet Commonly known as: TYLENOL Take 1,000 mg by mouth as needed for mild pain, moderate pain or headache.   aspirin EC 81 MG tablet Take 1 tablet (81 mg total) by mouth daily.   atorvastatin 80 MG tablet Commonly known as: LIPITOR Take 1 tablet (80 mg total) by mouth daily at 6 PM.   cephALEXin 500 MG capsule Commonly known as: KEFLEX Take 1 capsule (500 mg total) by mouth 3 (three) times daily.   cetirizine 10 MG tablet Commonly known as: ZYRTEC Take 10 mg by mouth at bedtime as needed for allergies.   clindamycin 1 % Swab Commonly known as: CLEOCIN T Apply 1 application topically 2 (two) times daily.   EYE VITAMINS PO Take 1 tablet by mouth in the morning and at bedtime. Focus Select Zinc Free (   glipiZIDE 2.5 MG 24 hr tablet Commonly known as: GLUCOTROL XL Take 2.5 mg by mouth daily with  breakfast.   loratadine 10 MG tablet Commonly known as: CLARITIN Take 10 mg by mouth daily as needed for allergies.   melatonin 5 MG Tabs Take 5 mg by mouth at bedtime.   multivitamin with minerals Tabs tablet Take 1 tablet by mouth in the morning.   nitroGLYCERIN 0.4 MG SL tablet Commonly known as: NITROSTAT Place 1 tablet (0.4 mg total) under the tongue every 5 (five) minutes x 3 doses as needed for chest pain.   olmesartan 40 MG tablet Commonly known as: BENICAR Take 1 tablet (40 mg total) by mouth daily.   oxyCODONE-acetaminophen 5-325 MG tablet Commonly known as: PERCOCET/ROXICET Take 1-2 tablets by mouth every 6 (six) hours as needed for severe pain.   pantoprazole 40 MG tablet Commonly known as: PROTONIX Take 1 tablet (40 mg total) by mouth 2 (two) times daily.   pantoprazole 40 MG tablet Commonly known as: PROTONIX Take 1 tablet (40 mg total) by mouth 2 (two) times daily.   promethazine 25 MG tablet Commonly known as: PHENERGAN Take 1 tablet (25 mg total) by mouth every 8 (eight) hours as needed for nausea or vomiting.   rivaroxaban 20 MG Tabs tablet Commonly known as: Xarelto TAKE 1 TABLET ONCE DAILY WITH DINNER.   sildenafil 50 MG tablet Commonly known as: VIAGRA Take 25 mg by mouth as needed.   UNABLE TO FIND Take 1 tablet by mouth as needed. Tadal 20mg /Oxyto 100 international units /L-Arg 50-400 mg ODT   vitamin C 1000 MG tablet Take 1,000 mg by mouth in the morning.   Vitamin D3 50 MCG (2000 UT) Tabs Take 2,000 Units by mouth in the morning.        Follow-up Information     Farris Has, MD Follow up in 1 week(s).   Specialty: Family Medicine Contact information: [address] [place] [address] [phone]         Vivi Barrack, DPM. Go to.   Specialty: Podiatry Why: as has been scheduled Contact information: [address] [place] [address] [place] [address] [phone]                   Time coordinating discharge: 39 minutes  Signed:  Majesty Stehlin  [facility] 12/02/2022, 11:45 AM

## 2022-12-02 NOTE — Progress Notes (Signed)
Rounding Note    Patient Name: Carlos Gutierrez Date of Encounter: 12/02/2022  [facility] Cardiologist: Lalla Brothers  Subjective   No complaints.   Inpatient Medications    Scheduled Meds:  ascorbic acid  1,000 mg Oral Daily   aspirin EC  81 mg Oral Daily   atorvastatin  80 mg Oral q1800   cephALEXin  500 mg Oral TID   cholecalciferol  2,000 Units Oral Daily   insulin aspart  0-15 Units Subcutaneous TID WC   insulin aspart  0-5 Units Subcutaneous QHS   irbesartan  300 mg Oral Daily   loratadine  10 mg Oral Daily   melatonin  5 mg Oral QHS   pantoprazole  40 mg Oral BID   rivaroxaban  20 mg Oral Q supper   Continuous Infusions:  PRN Meds: acetaminophen **OR** acetaminophen, oxyCODONE-acetaminophen   Vital Signs    Vitals:   12/02/22 0619 12/02/22 0700 12/02/22 0800 12/02/22 0805  BP: (!) 169/86 (!) 163/83 (!) 190/89 (!) 178/88  Pulse: 66 (!) 58 (!) 57 64  Resp:   20   Temp:   98.8 F (37.1 C)   TempSrc:   Oral   SpO2: 100% 99% 99% 100%   No intake or output data in the 24 hours ending 12/02/22 0852    02/22/2022    8:28 AM 02/02/2022    8:10 AM 09/24/2021    8:11 AM  Last 3 Weights  Weight (lbs) 230 lb 228 lb 9.6 oz 234 lb  Weight (kg) 104.327 kg 103.692 kg 106.142 kg      Telemetry    Sinus brady. While asleep he had some Mobitz 1 block - Personally Reviewed  ECG      Physical Exam   GEN: No acute distress.   Neck: No JVD Cardiac: RRR, no murmurs, rubs, or gallops.  Respiratory: Clear to auscultation bilaterally. GI: Soft, nontender, non-distended  MS: No edema; No deformity. Neuro:  Nonfocal  Psych: Normal affect   Labs    High Sensitivity Troponin:   Recent Labs  Lab 12/01/22 1505  TROPONINIHS 10     Chemistry Recent Labs  Lab 12/01/22 1505 12/02/22 0545  NA 137 136  K 4.3 4.0  CL 109 105  CO2 20* 23  GLUCOSE 122* 114*  BUN 16 15  CREATININE 1.04 1.14  CALCIUM 9.1 9.0  MG 2.1  --   PROT  --  6.6  ALBUMIN  --   3.8  AST  --  27  ALT  --  28  ALKPHOS  --  69  BILITOT  --  1.3*  GFRNONAA >60 >60  ANIONGAP 8 8    Lipids No results for input(s): "CHOL", "TRIG", "HDL", "LABVLDL", "LDLCALC", "CHOLHDL" in the last 168 hours.  Hematology Recent Labs  Lab 12/01/22 1505 12/02/22 0545  WBC 5.7 6.6  RBC 4.91 4.96  HGB 14.7 14.9  HCT 45.6 45.3  MCV 92.9 91.3  MCH 29.9 30.0  MCHC 32.2 32.9  RDW 13.8 13.8  PLT 147* 123*   Thyroid  Recent Labs  Lab 12/02/22 0545  TSH 3.358    BNPNo results for input(s): "BNP", "PROBNP" in the last 168 hours.  DDimer No results for input(s): "DDIMER" in the last 168 hours.   Radiology    No results found.  Cardiac Studies     Patient Profile     72 y.o. male with history of CAD, persistent atrial fib s/p ablation in 2022, HTN and  DM who presented to the ED post foot surgery with bradycardia. He developed bradycardia during his surgery with reported heart rates in the 30s. EKG on arrival to the ED with Mobitz 2 block.   Assessment & Plan    Bradycardia: Resolved. He has no complaints. Will arrange 14 day live monitor. Ok to discharge home.   For questions or updates, please contact  [facility] Please consult [email or web address] for contact info under        Signed, Verne Carrow, MD  12/02/2022, 8:52 AM

## 2022-12-02 NOTE — Progress Notes (Signed)
ZIO AT applied at hospital.  Dr. Lalla Brothers to read.

## 2022-12-02 NOTE — ED Notes (Signed)
This RN reviewed discharge instructions with patient and support person. Both verbalized understanding and denied any further questions. PT well appearing upon discharge and reports tolerable pain. Pt wheeled to exit. Pt endorses ride home.

## 2022-12-05 DIAGNOSIS — I441 Atrioventricular block, second degree: Secondary | ICD-10-CM | POA: Diagnosis not present

## 2022-12-06 ENCOUNTER — Ambulatory Visit (INDEPENDENT_AMBULATORY_CARE_PROVIDER_SITE_OTHER): Payer: BC Managed Care – PPO

## 2022-12-06 ENCOUNTER — Encounter: Payer: Medicare Other | Admitting: Podiatry

## 2022-12-06 ENCOUNTER — Ambulatory Visit (INDEPENDENT_AMBULATORY_CARE_PROVIDER_SITE_OTHER): Payer: BC Managed Care – PPO | Admitting: Podiatry

## 2022-12-06 DIAGNOSIS — M2011 Hallux valgus (acquired), right foot: Secondary | ICD-10-CM

## 2022-12-06 DIAGNOSIS — Z09 Encounter for follow-up examination after completed treatment for conditions other than malignant neoplasm: Secondary | ICD-10-CM

## 2022-12-06 NOTE — Progress Notes (Signed)
Under the gauze  Chief Complaint  Patient presents with   Routine Post Op    POV #1 DOS 12/01/22 --- RIGHT FOOT BUNION CORRECTION, [place], [place]KEN VERSUS FUSION BIG TOE JOINT WITH METAPHALANGEAL JOINT, HAMMERTOE REPAIR SECOND TOE    Subjective:  Patient presents today status post bunionectomy with double osteotomy of the right foot as well as hammertoe repair with Weil shortening osteotomy second digit right.  DOS: 12/01/2022.  Dr. Ardelle Anton.  Patient doing well.  He has minimal WBAT in the cam boot with the assistance of a walker.  Dressings are clean dry and intact.  No new complaints  Past Medical History:  Diagnosis Date   ACL tear    RIGHT KNEE   Atrial fibrillation (HCC) 2012   Rare occurrences. On ASA & Flecainide prn   Atrial flutter (HCC)    s/p ablation 2008. On Flecainide prn.   Broken leg 1968   Concussion 1966   History of 4   Diabetes mellitus without complication (HCC)    DVT (deep venous thrombosis) (HCC)    LEFT LOWER LEG   Dyslipidemia    Essential hypertension, benign    Insomnia    Low back pain    Obesity (BMI 30-39.9)    Obstructive sleep apnea on CPAP    Mild   PAF (paroxysmal atrial fibrillation) (HCC)    Rare occurrences. On ASA & Flecainide prn   Pure hypercholesterolemia    On Lipitor    Right shoulder pain    Intermittent   Status post ablation of atrial flutter 2008   Umbilical hernia     Past Surgical History:  Procedure Laterality Date   ablation for atrial flutter  01/2002   ATRIAL FIBRILLATION ABLATION N/A 07/18/2020   Procedure: ATRIAL FIBRILLATION ABLATION;  Surgeon: Lanier Prude, MD;  Location: [facility];  Service: Cardiovascular;  Laterality: N/A;   CARDIAC CATHETERIZATION  12/26/01   CARDIAC CATHETERIZATION  04/18/2019   COLONOSCOPY WITH PROPOFOL N/A 03/12/2016   Procedure: COLONOSCOPY WITH PROPOFOL;  Surgeon: Jeani Hawking, MD;  Location: [facility] ENDOSCOPY;  Service: Endoscopy;  Laterality: N/A;   COLONOSCOPY WITH PROPOFOL  N/A 05/05/2019   Procedure: COLONOSCOPY WITH PROPOFOL;  Surgeon: Jeani Hawking, MD;  Location: [facility] ENDOSCOPY;  Service: Endoscopy;  Laterality: N/A;   coronary angiography  1997   40% stenosis   ESOPHAGOGASTRODUODENOSCOPY (EGD) WITH PROPOFOL N/A 10/15/2016   Procedure: ESOPHAGOGASTRODUODENOSCOPY (EGD) WITH PROPOFOL;  Surgeon: Jeani Hawking, MD;  Location: [facility] ENDOSCOPY;  Service: Endoscopy;  Laterality: N/A;   ESOPHAGOGASTRODUODENOSCOPY (EGD) WITH PROPOFOL N/A 12/14/2019   Procedure: ESOPHAGOGASTRODUODENOSCOPY (EGD) WITH PROPOFOL;  Surgeon: Jeani Hawking, MD;  Location: [facility] ENDOSCOPY;  Service: Endoscopy;  Laterality: N/A;   LEFT HEART CATH AND CORONARY ANGIOGRAPHY N/A 02/11/2017   Procedure: LEFT HEART CATH AND CORONARY ANGIOGRAPHY;  Surgeon: Lyn Records, MD;  Location: [facility];  Service: Cardiovascular;  Laterality: N/A;   LEFT HEART CATH AND CORONARY ANGIOGRAPHY N/A 04/18/2019   Procedure: LEFT HEART CATH AND CORONARY ANGIOGRAPHY;  Surgeon: Lyn Records, MD;  Location: [facility];  Service: Cardiovascular;  Laterality: N/A;   POLYPECTOMY  05/05/2019   Procedure: POLYPECTOMY;  Surgeon: Jeani Hawking, MD;  Location: [facility] ENDOSCOPY;  Service: Endoscopy;;   right knee surgery  1996   ACL, meniscus problems   UMBILICAL HERNIA REPAIR  04/2015    Allergies  Allergen Reactions   Metformin And Related Nausea And Vomiting and Other (See Comments)    Dizziness and  disorientation, too    Objective/Physical Exam Neurovascular status intact.  Incision well coapted with sutures intact. No sign of infectious process noted. No dehiscence. No active bleeding noted.  Moderate edema noted to the surgical extremity with ecchymosis around the great toe joint.  Radiographic Exam RT foot 12/06/2022:  Orthopedic hardware and osteotomies sites appear to be stable with routine healing.  Screw fixation with internal K wire fixation noted to the first ray.  Overall good realignment of the first ray and  second digit.  Assessment: 1. s/p bunionectomy with double osteotomy RT.  Hammertoe repair with Weil shortening osteotomy 2nd RT.. DOS: 12/01/2022   Plan of Care:  -Patient was evaluated. X-rays reviewed - Dressings changed.  Clean dry and intact x 1 week until next follow-up visit with Dr. Ardelle Anton -Continue minimal WBAT heel pressure only in the cam boot with the assistance of the walker -Return to clinic 1 week   Felecia Shelling, DPM [facility]  Dr. Felecia Shelling, DPM    [address] [place], [place] [address]                Office [phone]  Fax [phone]

## 2022-12-13 ENCOUNTER — Other Ambulatory Visit: Payer: Self-pay | Admitting: Gastroenterology

## 2022-12-13 DIAGNOSIS — K7460 Unspecified cirrhosis of liver: Secondary | ICD-10-CM | POA: Diagnosis not present

## 2022-12-13 DIAGNOSIS — K766 Portal hypertension: Secondary | ICD-10-CM | POA: Diagnosis not present

## 2022-12-13 DIAGNOSIS — I4891 Unspecified atrial fibrillation: Secondary | ICD-10-CM | POA: Diagnosis not present

## 2022-12-14 DIAGNOSIS — I10 Essential (primary) hypertension: Secondary | ICD-10-CM | POA: Diagnosis not present

## 2022-12-16 ENCOUNTER — Ambulatory Visit (INDEPENDENT_AMBULATORY_CARE_PROVIDER_SITE_OTHER): Payer: BC Managed Care – PPO | Admitting: Podiatry

## 2022-12-16 ENCOUNTER — Encounter: Payer: Self-pay | Admitting: Podiatry

## 2022-12-16 VITALS — Ht 72.0 in | Wt 230.0 lb

## 2022-12-16 DIAGNOSIS — M2042 Other hammer toe(s) (acquired), left foot: Secondary | ICD-10-CM

## 2022-12-16 DIAGNOSIS — M2011 Hallux valgus (acquired), right foot: Secondary | ICD-10-CM

## 2022-12-16 DIAGNOSIS — M2041 Other hammer toe(s) (acquired), right foot: Secondary | ICD-10-CM

## 2022-12-19 NOTE — Progress Notes (Signed)
   Chief Complaint  Patient presents with   Routine Post Op    Subjective:  72 year old male presents today status post bunionectomy with double osteotomy of the right foot as well as hammertoe repair with Weil shortening osteotomy second digit right.  She is doing well.  He has gone back to work.  He states.  Does not report any fevers or chills.  No chest pain shortness of breath.  Presents today for possible suture removal.  Objective/Physical Exam General: AAO x3, NAD  Dermatological: Incision appears to be healing well.  Sutures are intact.  There is minimal edema.  There is no erythema, drainage or pus or ascending cellulitis.  There is minimal bruising present.  Mild swelling still evident.  No history of infection noted.  Vascular: Dorsalis Pedis artery and Posterior Tibial artery pedal pulses are 2/4 bilateral with immedate capillary fill time.  There is no pain with calf compression, swelling, warmth, erythema.   Neruologic: Grossly intact via light touch bilateral.  Musculoskeletal: No significant pain on surgical sites today.  Mild residual hallux abductus present.  Gait: Unassisted, Nonantalgic.    Assessment: 1. s/p bunionectomy with double osteotomy RT.  Hammertoe repair with Weil shortening osteotomy 2nd RT.. DOS: 12/01/2022   Plan of Care:  -I removed the sutures that need to be removed.  Steri-Strips applied for reinforcement, antibiotic ointment and dressing.  Discussed we can start to change the dressing with a similar bandage. -Continue ice, elevate as well as compression to help with residual edema.  Still limited weightbearing mostly weightbearing to the heel as tolerated with crutches. -Monitor for any clinical signs or symptoms of infection and directed to call the office immediately should any occur or go to the ER.  No follow-ups on file.  Vivi Barrack DPM

## 2022-12-23 ENCOUNTER — Ambulatory Visit
Admission: RE | Admit: 2022-12-23 | Discharge: 2022-12-23 | Disposition: A | Discharge status: Home / Self Care | Payer: BC Managed Care – PPO | Source: Ambulatory Visit | Attending: Gastroenterology | Admitting: Gastroenterology

## 2022-12-23 DIAGNOSIS — K7460 Unspecified cirrhosis of liver: Secondary | ICD-10-CM

## 2022-12-30 ENCOUNTER — Encounter: Payer: Self-pay | Admitting: Podiatry

## 2022-12-30 ENCOUNTER — Ambulatory Visit (INDEPENDENT_AMBULATORY_CARE_PROVIDER_SITE_OTHER): Payer: BC Managed Care – PPO

## 2022-12-30 ENCOUNTER — Ambulatory Visit (INDEPENDENT_AMBULATORY_CARE_PROVIDER_SITE_OTHER): Payer: BC Managed Care – PPO | Admitting: Podiatry

## 2022-12-30 DIAGNOSIS — M2011 Hallux valgus (acquired), right foot: Secondary | ICD-10-CM

## 2022-12-30 DIAGNOSIS — M2041 Other hammer toe(s) (acquired), right foot: Secondary | ICD-10-CM

## 2022-12-30 DIAGNOSIS — M2042 Other hammer toe(s) (acquired), left foot: Secondary | ICD-10-CM

## 2023-01-02 NOTE — Progress Notes (Unsigned)
Cardiology Office Note:  .   Date:  01/02/2023  ID:  Carlos Gutierrez, Carlos Gutierrez [birth year], MRN [id number] PCP: Farris Has, MD  [facility] Providers Cardiologist:  Lesleigh Noe, MD (Inactive) Electrophysiologist:  Lanier Prude, MD {  History of Present Illness: .   Carlos Gutierrez is a 72 y.o. male w/PMHx of CAD (non-obstructive), HTN, HLD, DM, AFlutter (ablated 2003), and AFib, DVT, OSA w/CPAP   He saw Dr. Lalla Brothers Dec 2023, doing well, post ablation and off Tikosyn, planned to maintain [facility]  He was hospitalized 12/01/22 for bradycardia during foot surgery, associated with anesthesia/hypotension. (At a outpt surgical center) Cardiology consulted  Initial EKG appears to be second-degree AV block type II.  Review of telemetry shows episodes of bradycardia with heart rate dropping as low as 29.  Intermittent episodes of second-degree AV block type I and type II.  HR and conduction recovered Planned for obs monitoring Had nocturnal Mobitz one Planned for d/c w/ monitoring    Today's visit is scheduled as a post hospital visit  ROS: ***  *** no monitor yet *** symptoms, brady, AF *** xarelto, bleeding, dose, labs  Arrhythmia/AAD hx Flecainide (pill in pocket) stopped 2021 with progression CAD (remaining non-obstructive) Tikosyn started Feb 2021 > stopped May 2023 PVI ablation 07/18/21 (evidence of prior CTI ablation, had block)  Studies Reviewed: [name]    EKG done today and reviewed by myself:  ***  07/18/20: EPS/ablation CONCLUSIONS: 1. Successful PVI 2. CTI block, evidence of prior CTI ablation 3. Intracardiac echo reveals normal LA architecture and trivial pericardial effusion 4. No early apparent complications   PET stress  Jul 28, 2021 and showed no findings concerning for significant ischemia   LHC 04/18/19  30 to 40% distal left main, which is new/progressed from 2018. Luminal irregularities in the LAD mid and distal. First obtuse marginal 50% tandem  proximal to mid stenoses. 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  Unchanged compared to prior images.   04/18/2019: TTE 1. Left ventricular ejection fraction, by visual estimation, is 50 to  55%. The left ventricle has normal function. There is no left ventricular  hypertrophy.   2. Definity contrast agent was given IV to delineate the left ventricular  endocardial borders.   3. The left ventricle has no regional wall motion abnormalities.   4. Left ventricular diastolic parameters are indeterminate.   5. Global right ventricle has normal systolic function.The right  ventricular size is not well visualized. Right vetricular wall thickness  was not assessed.   6. Left atrial size was normal.   7. Right atrial size was normal.   8. The mitral valve is normal in structure. No evidence of mitral valve  regurgitation. No evidence of mitral stenosis.   9. The tricuspid valve is normal in structure. Tricuspid valve  regurgitation is trivial.  10. The aortic valve is grossly normal. Aortic valve regurgitation is not  visualized. No evidence of aortic valve sclerosis or stenosis.  11. The pulmonic valve was normal in structure. Pulmonic valve  regurgitation is trivial.  12. The inferior vena cava is normal in size with greater than 50%  respiratory variability, suggesting right atrial pressure of 3 mmHg.  13. TR signal is inadequate for assessing pulmonary artery systolic  pressure.     Risk Assessment/Calculations:    Physical Exam:   VS:  There were no vitals taken for this visit.  Wt Readings from Last 3 Encounters:  12/16/22 230 lb (104.3 kg)  02/22/22 230 lb (104.3 kg)  02/02/22 228 lb 9.6 oz (103.7 kg)    GEN: Well nourished, well developed in no acute distress NECK: No JVD; No carotid bruits CARDIAC: ***RRR, no murmurs, rubs, gallops RESPIRATORY:  *** CTA b/l without rales, wheezing or rhonchi  ABDOMEN: Soft,  non-tender, non-distended EXTREMITIES:  No edema; No deformity    ASSESSMENT AND PLAN: .    persistent AFib CHA2DS2Vasc is 5, on Xarelto, *** appropriately dosed *** burden by symptoms ***  CAD, non-obstructive by cath) ***  HTN ***  Secondary hypercoagulable state 2/2 AFib     {Are you ordering a CV Procedure (e.g. stress test, cath, DCCV, TEE, etc)?   Press F2        :[id number]}     Dispo: ***  Signed, Sheilah Pigeon, PA-C

## 2023-01-03 ENCOUNTER — Ambulatory Visit: Payer: BC Managed Care – PPO | Attending: Physician Assistant | Admitting: Physician Assistant

## 2023-01-03 ENCOUNTER — Encounter: Payer: Self-pay | Admitting: Physician Assistant

## 2023-01-03 VITALS — BP 118/70 | HR 62 | Ht 73.0 in | Wt 224.2 lb

## 2023-01-03 DIAGNOSIS — I10 Essential (primary) hypertension: Secondary | ICD-10-CM

## 2023-01-03 DIAGNOSIS — I4819 Other persistent atrial fibrillation: Secondary | ICD-10-CM | POA: Diagnosis not present

## 2023-01-03 DIAGNOSIS — D6869 Other thrombophilia: Secondary | ICD-10-CM

## 2023-01-03 DIAGNOSIS — I441 Atrioventricular block, second degree: Secondary | ICD-10-CM | POA: Diagnosis not present

## 2023-01-03 DIAGNOSIS — R001 Bradycardia, unspecified: Secondary | ICD-10-CM

## 2023-01-03 NOTE — Patient Instructions (Signed)
Medication Instructions:   Your physician recommends that you continue on your current medications as directed. Please refer to the Current Medication list given to you today.   *If you need a refill on your cardiac medications before your next appointment, please call your pharmacy*   Lab Work:  None ordered.  If you have labs (blood work) drawn today and your tests are completely normal, you will receive your results only by: [place] (if you have MyChart) OR A paper copy in the mail If you have any lab test that is abnormal or we need to change your treatment, we will call you to review the results.   Testing/Procedures:  None ordered.   Follow-Up: At [facility], you and your health needs are our priority.  As part of our continuing mission to provide you with exceptional heart care, we have created designated Provider Care Teams.  These Care Teams include your primary Cardiologist (physician) and Advanced Practice Providers (APPs -  Physician Assistants and Nurse Practitioners) who all work together to provide you with the care you need, when you need it.  We recommend signing up for the patient portal called "MyChart".  Sign up information is provided on this After Visit Summary.  MyChart is used to connect with patients for Virtual Visits (Telemedicine).  Patients are able to view lab/test results, encounter notes, upcoming appointments, etc.  Non-urgent messages can be sent to your provider as well.   To learn more about what you can do with MyChart, go to [email or web address].    Your next appointment:   3 month(s)  Provider:   Lars Mage, MD

## 2023-01-04 ENCOUNTER — Telehealth: Payer: Self-pay | Admitting: Cardiology

## 2023-01-04 NOTE — Telephone Encounter (Signed)
Spoke with Reuel Boom from [facility] who reports they are in the process of uploading the final report from the patient's heart monitor. They are calling to notify us of two episodes of complete heart block, one on 9/26 at 1142pm for 11 seconds, one on 10/03 at 2:12am for 15 seconds. The patient did not report any symptoms. Patient was seen by [facility] yesterday. Will await final report that is being expedited.

## 2023-01-04 NOTE — Telephone Encounter (Signed)
New Message:      Carlos Gutierrez has a critical EKG.

## 2023-01-04 NOTE — Addendum Note (Signed)
Encounter addended by: Andee Lineman A on: 01/04/2023 10:43 AM  Actions taken: Imaging Exam ended

## 2023-01-04 NOTE — Progress Notes (Signed)
   Chief Complaint  Patient presents with   Routine Post Op    PATIENT STATES THAT HE IS GOING OUT OF TOWN AT THE END OF THE MONTH AND PATIENT WANTS TO KNOW IF HE CAN HEEL TOE AND LOSE THE WALKER AND IF YOU ARE GONA TAKE THE PIN OUT IN 6WEEKS CAN YOU TAKE IT OUT THE 28TH OR 29TH OF THIS MONTH . AND CAN HE WEAR A PROCTED SHOE     Subjective:  72 year old male presents today status post bunionectomy with double osteotomy of the right foot as well as hammertoe repair with Weil shortening osteotomy second digit right.  States he is doing well.   Denies any fevers or chills.  Pain is controlled.   Objective/Physical Exam General: AAO x3, NAD  Dermatological: Incision appears to be healing well.  Sutures are intact.  There is minimal and improving edema.  There is no erythema, drainage or pus or ascending cellulitis.  There is minimal bruising present.  Mild swelling still evident.  No history of infection noted.  Vascular: Dorsalis Pedis artery and Posterior Tibial artery pedal pulses are 2/4 bilateral with immedate capillary fill time.  There is no pain with calf compression, swelling, warmth, erythema.   Neruologic: Grossly intact via light touch bilateral.  Musculoskeletal: No significant pain on surgical sites today.  Mild residual hallux abductus present.  Gait: Unassisted, Nonantalgic.    Assessment: 1. s/p bunionectomy with double osteotomy RT.  Hammertoe repair with Weil shortening osteotomy 2nd RT.. DOS: 12/01/2022   Plan of Care:  -Treatment options discussed including all alternatives, risks, and complications -Etiology of symptoms were discussed -X-rays were obtained and reviewed with the patient.  3 views of the foot obtained.  Hardware intact with any complicating factors. -I removed the remainder of the sutures did not need to be removed.  This is more on the proximal half of the incision for the bunion.  Incisions healing without any sign of infection or dehiscence.   Hardware is not palpable at this time. -Discussed cam boot, ice, elevation. -Medication as needed -Will plan on removing K wires next appointment  No follow-ups on file.  Vivi Barrack DPM

## 2023-01-06 NOTE — Telephone Encounter (Signed)
Per Dr. Lalla Brothers - patient needs an appointment with him to discuss PPM as his GI doctor want to but him on a beta blocker. Appointment has been scheduled.

## 2023-01-10 ENCOUNTER — Encounter: Payer: Self-pay | Admitting: Podiatry

## 2023-01-10 ENCOUNTER — Ambulatory Visit (INDEPENDENT_AMBULATORY_CARE_PROVIDER_SITE_OTHER): Payer: BC Managed Care – PPO

## 2023-01-10 ENCOUNTER — Ambulatory Visit (INDEPENDENT_AMBULATORY_CARE_PROVIDER_SITE_OTHER): Payer: Medicare Other | Admitting: Podiatry

## 2023-01-10 DIAGNOSIS — M2011 Hallux valgus (acquired), right foot: Secondary | ICD-10-CM

## 2023-01-10 DIAGNOSIS — M2042 Other hammer toe(s) (acquired), left foot: Secondary | ICD-10-CM

## 2023-01-10 DIAGNOSIS — M2041 Other hammer toe(s) (acquired), right foot: Secondary | ICD-10-CM

## 2023-01-14 DIAGNOSIS — I10 Essential (primary) hypertension: Secondary | ICD-10-CM | POA: Diagnosis not present

## 2023-01-16 NOTE — Progress Notes (Signed)
     Subjective:  72 year old male presents today status post bunionectomy with double osteotomy of the right foot as well as hammertoe repair with Weil shortening osteotomy second digit right.  States he is doing well.   Presents today for K wire removal.  Denies any fevers or chills.  Pain is controlled.   Objective/Physical Exam General: AAO x3, NAD  Dermatological: Incision appears to be healing well.  Sutures are intact.  There is minimal and improving edema.  There is no erythema, drainage or pus or ascending cellulitis.  No signs of infection noted today.   Vascular: Dorsalis Pedis artery and Posterior Tibial artery pedal pulses are 2/4 bilateral with immedate capillary fill time.  There is no pain with calf compression, swelling, warmth, erythema.   Neruologic: Grossly intact via light touch bilateral.  Musculoskeletal: No significant pain on surgical sites today.  Mild residual hallux abductus present.  Gait: Unassisted, Nonantalgic.    Assessment: 1. s/p bunionectomy with double osteotomy RT.  Hammertoe repair with Weil shortening osteotomy 2nd RT.. DOS: 12/01/2022   Plan of Care:  -Treatment options discussed including all alternatives, risks, and complications -Etiology of symptoms were discussed -X-rays were obtained and reviewed with the patient.  3 views of the foot obtained.  Hardware intact with any complicating factors. -Remove the K wire on the second toe today with any complications.  Vicodin was applied followed by dressing. -Continue weight-bear as tolerated in the cam boot.  Ice, elevation as well as compression of the residual edema. -Discussed at some point we may need to remove the wire along the first metatarsal osteotomy. -Monitor for any clinical signs or symptoms of infection and directed to call the office immediately should any occur or go to the ER.  X-ray next appointment  Vivi Barrack DPM

## 2023-01-29 ENCOUNTER — Other Ambulatory Visit: Payer: Self-pay | Admitting: Physician Assistant

## 2023-01-29 ENCOUNTER — Other Ambulatory Visit: Payer: Self-pay | Admitting: Interventional Cardiology

## 2023-01-31 ENCOUNTER — Ambulatory Visit (INDEPENDENT_AMBULATORY_CARE_PROVIDER_SITE_OTHER): Payer: BC Managed Care – PPO

## 2023-01-31 ENCOUNTER — Ambulatory Visit (INDEPENDENT_AMBULATORY_CARE_PROVIDER_SITE_OTHER): Payer: Medicare Other | Admitting: Podiatry

## 2023-01-31 ENCOUNTER — Encounter: Payer: Self-pay | Admitting: Podiatry

## 2023-01-31 DIAGNOSIS — M2011 Hallux valgus (acquired), right foot: Secondary | ICD-10-CM

## 2023-01-31 DIAGNOSIS — M2042 Other hammer toe(s) (acquired), left foot: Secondary | ICD-10-CM

## 2023-01-31 DIAGNOSIS — M2041 Other hammer toe(s) (acquired), right foot: Secondary | ICD-10-CM

## 2023-01-31 NOTE — Progress Notes (Unsigned)
     Subjective:  72 year old male presents today status post bunionectomy with double osteotomy of the right foot as well as hammertoe repair with Weil shortening osteotomy second digit right.  He has transition back into regular shoe.  He is not having any pain.  Does not report any fevers or chills.  He has no new concerns today.     Objective/Physical Exam General: AAO x3, NAD  Dermatological: Incision appears to be healing well.  Small scab present on the proximal aspect the incision.  There is no open lesion.  Minimal edema.  No erythema or warmth.  No drainage or pus or any signs of infection clinically.  Vascular: Dorsalis Pedis artery and Posterior Tibial artery pedal pulses are 2/4 bilateral with immedate capillary fill time.  There is no pain with calf compression, swelling, warmth, erythema.   Neruologic: Grossly intact via light touch bilateral.  Musculoskeletal: No significant pain on surgical sites today.  Mild residual hallux abductus present.  Gait: Unassisted, Nonantalgic.    Assessment: 1. s/p bunionectomy with double osteotomy RT.  Hammertoe repair with Weil shortening osteotomy 2nd RT.. DOS: 12/01/2022   Plan of Care:  -Treatment options discussed including all alternatives, risks, and complications -Etiology of symptoms were discussed -X-rays were obtained and reviewed with the patient.  3 views of the foot obtained.  Hardware intact with any complicating factors. Hardware is prominent.  -I discussed with him removal of the K wire.  Upon doing this in the office on December 4.  Currently although able to palpate the hardware there is skin the skin overlying is intact, no opening or signs of infection.  -Continue regular shoe as tolerated. -Ice/elevation as well as compression for the residual edema. -Monitor for any clinical signs or symptoms of infection and directed to call the office immediately should any occur or go to the ER.  No follow-ups on  file.  Vivi Barrack DPM

## 2023-02-02 ENCOUNTER — Encounter: Payer: Self-pay | Admitting: Cardiology

## 2023-02-02 ENCOUNTER — Ambulatory Visit: Payer: BC Managed Care – PPO | Attending: Cardiology | Admitting: Cardiology

## 2023-02-02 VITALS — BP 142/84 | HR 67 | Ht 73.0 in | Wt 223.4 lb

## 2023-02-02 DIAGNOSIS — I4819 Other persistent atrial fibrillation: Secondary | ICD-10-CM

## 2023-02-02 MED ORDER — AMLODIPINE BESYLATE 5 MG PO TABS: 5.0000 mg | ORAL_TABLET | Freq: Every day | ORAL | 3 refills | Status: DC

## 2023-02-02 NOTE — Patient Instructions (Addendum)
Medication Instructions:  Your physician has recommended you make the following change in your medication:  1) START taking amlodipine 5 mg daily *If you need a refill on your cardiac medications before your next appointment, please call your pharmacy*   Follow-Up: At [facility], you and your health needs are our priority.  As part of our continuing mission to provide you with exceptional heart care, we have created designated Provider Care Teams.  These Care Teams include your primary Cardiologist (physician) and Advanced Practice Providers (APPs -  Physician Assistants and Nurse Practitioners) who all work together to provide you with the care you need, when you need it.   Your next appointment:   6 months  Provider:   You will see one of the following Advanced Practice Providers on your designated Care Team:   Carlos Gutierrez, Carlos Gutierrez [address] [place], [place] Carlos Don, NP Carlos Brim, NP

## 2023-02-02 NOTE — Progress Notes (Signed)
Electrophysiology Office Follow up Visit Note:    Date:  02/02/2023   ID:  Marcquez, Vizzi [birth year], MRN [id number]  PCP:  Farris Has, MD  [facility] Cardiologist:  Lesleigh Noe, MD (Inactive)  [facility] Electrophysiologist:  Lanier Prude, MD    Interval History:     Carlos Gutierrez is a 72 y.o. male who presents for a follow up visit.   Discussed the use of AI scribe software for clinical note transcription with the patient, who gave verbal consent to proceed.  History of Present Illness   Mr. Crothers, a 72 year old with a history of coronary artery disease, hypertension, hyperlipidemia, diabetes, atrial flutter, atrial fibrillation, DVT, obstructive sleep apnea, and cirrhosis, presents for follow-up. He had an AFib ablation on May 6th, 2022 and was previously on dofetilide. He has been doing well since his last appointment and has been taking Xarelto for stroke prophylaxis. He had some bradycardia around the time of a procedure and a Zio monitor was ordered. The Zio monitor showed an average heart rate of sixty four beats per minute with no atrial fibrillation and rare ectopy. He also has a diagnosis of cirrhosis and his gastroenterologist had recommended a beta blocker, but it was decided that it was not a necessity if a cardiac condition would limit its use.  The patient has been experiencing some variability in his blood pressure, which has been a concern. He has been active and has been trying to manage his health through nutrition and exercise. He recently had foot surgery and is in the process of recovery. He has been taking olmesartan for his blood pressure for a long time.            Past medical, surgical, social and family history were reviewed.  ROS:   Please see the history of present illness.    All other systems reviewed and are negative.  EKGs/Labs/Other Studies Reviewed:    The following studies were reviewed today:     EKG  Interpretation Date/Time:  Wednesday February 02 2023 16:20:29 EST Ventricular Rate:  67 PR Interval:  312 QRS Duration:  104 QT Interval:  406 QTC Calculation: 429 R Axis:   65  Text Interpretation: Sinus rhythm with 1st degree A-V block Confirmed by Steffanie Dunn [phone]) on 02/02/2023 4:56:59 PM    Physical Exam:    VS:  BP (!) 142/84   Pulse 67   Ht 6\' 1"  (1.854 m)   Wt 223 lb 6.4 oz (101.3 kg)   SpO2 97%   BMI 29.47 kg/m     Wt Readings from Last 3 Encounters:  02/02/23 223 lb 6.4 oz (101.3 kg)  01/03/23 224 lb 3.2 oz (101.7 kg)  12/16/22 230 lb (104.3 kg)     Physical Exam   VITALS: P- 67, BP- 142/84 GENERAL: Well appearing male, no distress CHEST: Lungs clear to auscultation CARDIOVASCULAR: Regular rate and rhythm          ASSESSMENT:    1. Persistent atrial fibrillation (HCC)    PLAN:    In order of problems listed above:  Assessment and Plan    Bradycardia Nocturnal bradycardia noted on Xeo monitor. EKG showed sinus rhythm with a first degree AV delay and a ventricular rate of 67 beats per minute. No symptoms of lightheadedness, dizziness, or changes in exercise tolerance reported. -Monitor for symptoms of lightheadedness, dizziness, or changes in exercise tolerance. -If symptoms occur, contact the office for further evaluation.  Hypertension  Blood pressure readings variable, with average readings in the 150s. Currently on Olmesartan. -Add Amlodipine 5mg  daily. -Continue monitoring blood pressure at home. -Primary care to reassess blood pressure control at next appointment and consider increasing Amlodipine to 10mg  daily if needed.  Atrial Fibrillation No atrial fibrillation noted on recent Zio monitor. Patient had successful ablation on Jul 18, 2020, and is currently on Xarelto for stroke prophylaxis. -Continue Xarelto for stroke prophylaxis. -Follow-up in 6 months with an APP.  General Health Maintenance -Continue to stay active. -Follow-up  with primary care provider next week.               Signed, Steffanie Dunn, MD, [facility], [facility] 02/02/2023 9:22 PM    Electrophysiology [facility]

## 2023-02-09 DIAGNOSIS — E1169 Type 2 diabetes mellitus with other specified complication: Secondary | ICD-10-CM | POA: Diagnosis not present

## 2023-02-09 DIAGNOSIS — N400 Enlarged prostate without lower urinary tract symptoms: Secondary | ICD-10-CM | POA: Diagnosis not present

## 2023-02-09 DIAGNOSIS — E785 Hyperlipidemia, unspecified: Secondary | ICD-10-CM | POA: Diagnosis not present

## 2023-02-09 DIAGNOSIS — I10 Essential (primary) hypertension: Secondary | ICD-10-CM | POA: Diagnosis not present

## 2023-02-13 DIAGNOSIS — I10 Essential (primary) hypertension: Secondary | ICD-10-CM | POA: Diagnosis not present

## 2023-02-16 ENCOUNTER — Ambulatory Visit (INDEPENDENT_AMBULATORY_CARE_PROVIDER_SITE_OTHER): Payer: Medicare Other

## 2023-02-16 ENCOUNTER — Ambulatory Visit (INDEPENDENT_AMBULATORY_CARE_PROVIDER_SITE_OTHER): Payer: Medicare Other | Admitting: Podiatry

## 2023-02-16 DIAGNOSIS — Z969 Presence of functional implant, unspecified: Secondary | ICD-10-CM

## 2023-02-16 DIAGNOSIS — Z9889 Other specified postprocedural states: Secondary | ICD-10-CM

## 2023-02-16 MED ORDER — CEPHALEXIN 500 MG PO CAPS: 500.0000 mg | ORAL_CAPSULE | Freq: Three times a day (TID) | ORAL | 0 refills | Status: DC

## 2023-02-20 NOTE — Progress Notes (Signed)
Subjective: No chief complaint on file.  72 year old male presents the office today for her removal of the right foot.  He states he been doing well he is wearing his regular shoe.  He only has discomfort is where the K wire is prominent.  No open lesions that he reports.  No recent injury or changes.  He has no new concerns.  Objective: AAO x3, NAD DP/PT pulses palpable bilaterally, CRT less than 3 seconds Incision is well coapted and scar is formed.  The K wire is prominent on the proximal aspect and easily able to palpate this but there is no open lesion identified at this area.  There is no erythema warmth or any signs of infection.  There is no other areas of comfort.  Mild swelling.  Second toe does sit slightly dorsiflexed. No pain with calf compression, swelling, warmth, erythema  Assessment: Prominent change hardware with  Plan: -All treatment options discussed with the patient including all alternatives, risks, complications.  -Given the palpable hardware, pain and had to remove this.  Suspect complications at this stage segment.  I cleaned skin with alcohol and injection of lidocaine, Marcaine plain was infiltrated in a regional block fashion.  I also utilized 1.5 mL of lidocaine with epinephrine to help with hemostasis.  Once anesthetized I prepped the skin sterile fashion.  Timeouts performed.  I made a small 1 cm area of incision along the area of prominent hardware with a 15 blade scalpel.  Blunt dissection was then carried down to easily identify the K wire was removed in total.  There is no purulence or drainage or any signs of infection.  I irrigated copiously with saline.  The incision was closed with nylon.  Xeroform was applied followed by dressing.  He tolerated procedure any complications. -Surgical shoe dispensed -Postoperative instructions were discussed with the patient. -Monitor for any clinical signs or symptoms of infection and directed to call the office immediately  should any occur or go to the ER. -Patient encouraged to call the office with any questions, concerns, change in symptoms.   Vivi Barrack DPM

## 2023-02-21 ENCOUNTER — Encounter: Payer: Self-pay | Admitting: Podiatry

## 2023-02-25 ENCOUNTER — Ambulatory Visit (INDEPENDENT_AMBULATORY_CARE_PROVIDER_SITE_OTHER): Payer: Medicare Other | Admitting: Podiatry

## 2023-02-25 ENCOUNTER — Encounter: Payer: Self-pay | Admitting: Podiatry

## 2023-02-25 DIAGNOSIS — Z969 Presence of functional implant, unspecified: Secondary | ICD-10-CM

## 2023-02-25 DIAGNOSIS — M2011 Hallux valgus (acquired), right foot: Secondary | ICD-10-CM

## 2023-02-26 NOTE — Progress Notes (Signed)
Subjective: Chief Complaint  Patient presents with   Routine Post Op    [name] Right foot suture removal patient states doing well but thinks he has a blister on top of foot. Nail trim while he is here.   72 year old male presents the office today for suture removal status post hardware removal.  States he is doing well.  No significant pain.  No fevers or chills he reports he has no other concerns today.  Objective: AAO x3, NAD DP/PT pulses palpable bilaterally, CRT less than 3 seconds Incision well coapted with sutures intact there is no evidence of dehiscence.  There is mild edema but is no erythema or warmth.  There is no drainage or pus.  No fluctuation or any obvious signs of infection noted today.  No pain on exam. No pain with calf compression, swelling, warmth, erythema  Assessment: Status post hardware removal, healing well  Plan: -All treatment options discussed with the patient including all alternatives, risks, complications.  -Sutures removed without complications.  Steri-Strip applied for reinforcement and incisions healing well.  Continue a small amount of antibiotic ointment dressing changes daily.  He can wash with soap and water, dry thoroughly and apply a similar bandage.  He can start to transition to regular shoe as tolerated.  Continue ice and elevate as well as compression to help with residual edema. -Patient encouraged to call the office with any questions, concerns, change in symptoms.   Vivi Barrack DPM

## 2023-03-01 ENCOUNTER — Other Ambulatory Visit: Payer: Self-pay | Admitting: Physician Assistant

## 2023-03-16 DIAGNOSIS — I10 Essential (primary) hypertension: Secondary | ICD-10-CM | POA: Diagnosis not present

## 2023-03-28 ENCOUNTER — Ambulatory Visit: Payer: BC Managed Care – PPO | Admitting: Cardiology

## 2023-04-01 ENCOUNTER — Encounter: Payer: Self-pay | Admitting: Cardiology

## 2023-04-01 MED ORDER — AMLODIPINE BESYLATE 10 MG PO TABS: 10.0000 mg | ORAL_TABLET | Freq: Every day | ORAL | 0 refills | Status: DC

## 2023-04-04 ENCOUNTER — Ambulatory Visit: Payer: Medicare Other | Admitting: Podiatry

## 2023-04-05 DIAGNOSIS — Z86007 Personal history of in-situ neoplasm of skin: Secondary | ICD-10-CM | POA: Diagnosis not present

## 2023-04-05 DIAGNOSIS — L570 Actinic keratosis: Secondary | ICD-10-CM | POA: Diagnosis not present

## 2023-04-05 DIAGNOSIS — Z08 Encounter for follow-up examination after completed treatment for malignant neoplasm: Secondary | ICD-10-CM | POA: Diagnosis not present

## 2023-04-05 DIAGNOSIS — Z85828 Personal history of other malignant neoplasm of skin: Secondary | ICD-10-CM | POA: Diagnosis not present

## 2023-04-15 DIAGNOSIS — R972 Elevated prostate specific antigen [PSA]: Secondary | ICD-10-CM | POA: Diagnosis not present

## 2023-04-16 DIAGNOSIS — I10 Essential (primary) hypertension: Secondary | ICD-10-CM | POA: Diagnosis not present

## 2023-05-02 ENCOUNTER — Encounter: Payer: Self-pay | Admitting: Podiatry

## 2023-05-02 NOTE — Telephone Encounter (Signed)
Dawn, can you please assist? Thanks.

## 2023-05-05 ENCOUNTER — Ambulatory Visit: Payer: Medicare Other | Admitting: Podiatry

## 2023-05-09 ENCOUNTER — Ambulatory Visit (INDEPENDENT_AMBULATORY_CARE_PROVIDER_SITE_OTHER): Payer: Medicare Other | Admitting: Podiatry

## 2023-05-09 ENCOUNTER — Encounter: Payer: Self-pay | Admitting: Podiatry

## 2023-05-09 ENCOUNTER — Ambulatory Visit (INDEPENDENT_AMBULATORY_CARE_PROVIDER_SITE_OTHER): Payer: Medicare Other

## 2023-05-09 DIAGNOSIS — M21619 Bunion of unspecified foot: Secondary | ICD-10-CM | POA: Diagnosis not present

## 2023-05-09 DIAGNOSIS — Z9889 Other specified postprocedural states: Secondary | ICD-10-CM

## 2023-05-11 NOTE — Progress Notes (Signed)
 Subjective: Chief Complaint  Patient presents with   Routine Post Op    RM# POV  right foot patient states doing well has no concerns at this time.    73 year old male presents the office today for suture removal status post hardware removal.  States he is doing well.  He is back in regular shoes increase doing more normal activities as tolerated.  Does not have any pain and no increase in swelling.  He has no concerns today.  No injuries.  Objective: AAO x3, NAD DP/PT pulses palpable bilaterally, CRT less than 3 seconds Incision well coapted with sutures intact there is no evidence of dehiscence.  There is minimal edema present there is no erythema or warmth.  There is no drainage or pus.  No fluctuation or any obvious signs of infection noted today.  No pain on exam.  There is still some residual hallux abductus present.  There is decrease range of motion of the MTPJ of the second but there is no pain or crepitation noted. No pain with calf compression, swelling, warmth, erythema  Assessment: Status post hardware removal, healing well  Plan: -All treatment options discussed with the patient including all alternatives, risks, complications.  -X-rays obtained reviewed.  Multiple views were obtained.  Osteotomy.  Healed.  Hardware intact.  The second screw seems to be somewhat prominent. -I discussed with him removal of hardware if needed does not currently cause any pain I cannot palpate the hardware.  Will continue to monitor. -Discussed continue range of motion exercises particularly on the second MTPJ.  Continue shoes, good arch support. -Continue ice, elevate -At this point hhe is doing well without discharge of the postoperative course but encouraged to call the questions or concerns or any changes in the meantime.  Return if symptoms worsen or fail to improve.  Vivi Barrack DPM

## 2023-06-28 ENCOUNTER — Other Ambulatory Visit: Payer: Self-pay | Admitting: Cardiology

## 2023-07-04 ENCOUNTER — Other Ambulatory Visit: Payer: Self-pay | Admitting: Cardiology

## 2023-08-05 ENCOUNTER — Other Ambulatory Visit: Payer: Self-pay | Admitting: *Deleted

## 2023-08-05 DIAGNOSIS — I4819 Other persistent atrial fibrillation: Secondary | ICD-10-CM

## 2023-08-05 MED ORDER — RIVAROXABAN 20 MG PO TABS: ORAL_TABLET | ORAL | 1 refills | Status: AC

## 2023-08-05 NOTE — Telephone Encounter (Signed)
 Xarelto  20mg  refill request received. Pt is 73 years old, weight-101.3kg, Crea-1.14 on 12/02/22, last seen byDr. Marven Slimmer on 02/02/23, Diagnosis-Afib, CrCl-85.16 mL/min; Dose is appropriate based on dosing criteria. Will send in refill to requested pharmacy.

## 2023-09-25 ENCOUNTER — Other Ambulatory Visit: Payer: Self-pay | Admitting: Interventional Cardiology

## 2023-09-27 NOTE — Telephone Encounter (Signed)
 Pt's pharmacy is requesting a refill on medication pantoprazole . Would Dr. Cindie like to refill this medication? Please address

## 2023-09-30 ENCOUNTER — Other Ambulatory Visit: Payer: Self-pay

## 2023-09-30 NOTE — Telephone Encounter (Signed)
 Pt's pharmacy is requesting a refill on medication pantoprazole . Pt doe not have a primary Cardiologist yet. Would Dr. Cindie like to refill this medication? Please address

## 2023-10-04 ENCOUNTER — Other Ambulatory Visit: Payer: Self-pay | Admitting: Student

## 2023-10-07 ENCOUNTER — Other Ambulatory Visit: Payer: Self-pay

## 2023-10-07 MED ORDER — PANTOPRAZOLE SODIUM 40 MG PO TBEC: 40.0000 mg | DELAYED_RELEASE_TABLET | Freq: Two times a day (BID) | ORAL | 0 refills | Status: DC

## 2023-10-27 ENCOUNTER — Other Ambulatory Visit: Payer: Self-pay | Admitting: Physician Assistant

## 2023-11-07 ENCOUNTER — Ambulatory Visit (INDEPENDENT_AMBULATORY_CARE_PROVIDER_SITE_OTHER): Admitting: Podiatry

## 2023-11-07 ENCOUNTER — Ambulatory Visit (INDEPENDENT_AMBULATORY_CARE_PROVIDER_SITE_OTHER)

## 2023-11-07 DIAGNOSIS — M2012 Hallux valgus (acquired), left foot: Secondary | ICD-10-CM

## 2023-11-07 DIAGNOSIS — M2011 Hallux valgus (acquired), right foot: Secondary | ICD-10-CM

## 2023-11-07 DIAGNOSIS — M722 Plantar fascial fibromatosis: Secondary | ICD-10-CM | POA: Diagnosis not present

## 2023-11-07 MED ORDER — TRIAMCINOLONE ACETONIDE 10 MG/ML IJ SUSP
5.0000 mg | Freq: Once | INTRAMUSCULAR | Status: AC
  Administered 2023-11-07: 5 mg via INTRAMUSCULAR

## 2023-11-07 NOTE — Patient Instructions (Signed)
For inserts I like POWERSTEPS, SUPERFEET, AETREX  ----  While at your visit today you received a steroid injection in your foot or ankle to help with your pain. Along with having the steroid medication there is some "numbing" medication in the shot that you received. Due to this you may notice some numbness to the area for the next couple of hours.   I would recommend limiting activity for the next few days to help the steroid injection take affect.    The actually benefit from the steroid injection may take up to 2-7 days to see a difference. You may actually experience a small (as in 10%) INCREASE in pain in the first 24 hours---that is common. It would be best if you can ice the area today and take anti-inflammatory medications (such as Ibuprofen, Motrin, or Aleve) if you are able to take these medications. If you were prescribed another medication to help with the pain go ahead and start that medication today    Things to watch out for that you should contact us or a health care provider urgently would include: 1. Unusual (as in more than 10%) increase in pain 2. New fever > 101.5 3. New swelling or redness of the injected area.  4. Streaking of red lines around the area injected.  If you have any questions or concerns about this, please give our office a call at [phone].    --  Plantar Fasciitis (Heel Spur Syndrome) with Rehab The plantar fascia is a fibrous, ligament-like, soft-tissue structure that spans the bottom of the foot. Plantar fasciitis is a condition that causes pain in the foot due to inflammation of the tissue. SYMPTOMS  Pain and tenderness on the underneath side of the foot. Pain that worsens with standing or walking. CAUSES  Plantar fasciitis is caused by irritation and injury to the plantar fascia on the underneath side of the foot. Common mechanisms of injury include: Direct trauma to bottom of the foot. Damage to a small nerve that runs under the foot where  the main fascia attaches to the heel bone. Stress placed on the plantar fascia due to bone spurs. RISK INCREASES WITH:  Activities that place stress on the plantar fascia (running, jumping, pivoting, or cutting). Poor strength and flexibility. Improperly fitted shoes. Tight calf muscles. Flat feet. Failure to warm-up properly before activity. Obesity. PREVENTION Warm up and stretch properly before activity. Allow for adequate recovery between workouts. Maintain physical fitness: Strength, flexibility, and endurance. Cardiovascular fitness. Maintain a health body weight. Avoid stress on the plantar fascia. Wear properly fitted shoes, including arch supports for individuals who have flat feet.  PROGNOSIS  If treated properly, then the symptoms of plantar fasciitis usually resolve without surgery. However, occasionally surgery is necessary.  RELATED COMPLICATIONS  Recurrent symptoms that may result in a chronic condition. Problems of the lower back that are caused by compensating for the injury, such as limping. Pain or weakness of the foot during push-off following surgery. Chronic inflammation, scarring, and partial or complete fascia tear, occurring more often from repeated injections.  TREATMENT  Treatment initially involves the use of ice and medication to help reduce pain and inflammation. The use of strengthening and stretching exercises may help reduce pain with activity, especially stretches of the Achilles tendon. These exercises may be performed at home or with a therapist. Your caregiver may recommend that you use heel cups of arch supports to help reduce stress on the plantar fascia. Occasionally, corticosteroid injections are given  to reduce inflammation. If symptoms persist for greater than 6 months despite non-surgical (conservative), then surgery may be recommended.   MEDICATION  If pain medication is necessary, then nonsteroidal anti-inflammatory medications, such as  aspirin and ibuprofen, or other minor pain relievers, such as acetaminophen, are often recommended. Do not take pain medication within 7 days before surgery. Prescription pain relievers may be given if deemed necessary by your caregiver. Use only as directed and only as much as you need. Corticosteroid injections may be given by your caregiver. These injections should be reserved for the most serious cases, because they may only be given a certain number of times.  HEAT AND COLD Cold treatment (icing) relieves pain and reduces inflammation. Cold treatment should be applied for 10 to 15 minutes every 2 to 3 hours for inflammation and pain and immediately after any activity that aggravates your symptoms. Use ice packs or massage the area with a piece of ice (ice massage). Heat treatment may be used prior to performing the stretching and strengthening activities prescribed by your caregiver, physical therapist, or athletic trainer. Use a heat pack or soak the injury in warm water.  SEEK IMMEDIATE MEDICAL CARE IF: Treatment seems to offer no benefit, or the condition worsens. Any medications produce adverse side effects.  EXERCISES- RANGE OF MOTION (ROM) AND STRETCHING EXERCISES - Plantar Fasciitis (Heel Spur Syndrome) These exercises may help you when beginning to rehabilitate your injury. Your symptoms may resolve with or without further involvement from your physician, physical therapist or athletic trainer. While completing these exercises, remember:  Restoring tissue flexibility helps normal motion to return to the joints. This allows healthier, less painful movement and activity. An effective stretch should be held for at least 30 seconds. A stretch should never be painful. You should only feel a gentle lengthening or release in the stretched tissue.  RANGE OF MOTION - Toe Extension, Flexion Sit with your right / left leg crossed over your opposite knee. Grasp your toes and gently pull them  back toward the top of your foot. You should feel a stretch on the bottom of your toes and/or foot. Hold this stretch for 10 seconds. Now, gently pull your toes toward the bottom of your foot. You should feel a stretch on the top of your toes and or foot. Hold this stretch for 10 seconds. Repeat  times. Complete this stretch 3 times per day.   RANGE OF MOTION - Ankle Dorsiflexion, Active Assisted Remove shoes and sit on a chair that is preferably not on a carpeted surface. Place right / left foot under knee. Extend your opposite leg for support. Keeping your heel down, slide your right / left foot back toward the chair until you feel a stretch at your ankle or calf. If you do not feel a stretch, slide your bottom forward to the edge of the chair, while still keeping your heel down. Hold this stretch for 10 seconds. Repeat 3 times. Complete this stretch 2 times per day.   STRETCH  Gastroc, Standing Place hands on wall. Extend right / left leg, keeping the front knee somewhat bent. Slightly point your toes inward on your back foot. Keeping your right / left heel on the floor and your knee straight, shift your weight toward the wall, not allowing your back to arch. You should feel a gentle stretch in the right / left calf. Hold this position for 10 seconds. Repeat 3 times. Complete this stretch 2 times per day.  STRETCH  Soleus, Standing Place hands on wall. Extend right / left leg, keeping the other knee somewhat bent. Slightly point your toes inward on your back foot. Keep your right / left heel on the floor, bend your back knee, and slightly shift your weight over the back leg so that you feel a gentle stretch deep in your back calf. Hold this position for 10 seconds. Repeat 3 times. Complete this stretch 2 times per day.  STRETCH  Gastrocsoleus, Standing  Note: This exercise can place a lot of stress on your foot and ankle. Please complete this exercise only if specifically instructed  by your caregiver.  Place the ball of your right / left foot on a step, keeping your other foot firmly on the same step. Hold on to the wall or a rail for balance. Slowly lift your other foot, allowing your body weight to press your heel down over the edge of the step. You should feel a stretch in your right / left calf. Hold this position for 10 seconds. Repeat this exercise with a slight bend in your right / left knee. Repeat 3 times. Complete this stretch 2 times per day.   STRENGTHENING EXERCISES - Plantar Fasciitis (Heel Spur Syndrome)  These exercises may help you when beginning to rehabilitate your injury. They may resolve your symptoms with or without further involvement from your physician, physical therapist or athletic trainer. While completing these exercises, remember:  Muscles can gain both the endurance and the strength needed for everyday activities through controlled exercises. Complete these exercises as instructed by your physician, physical therapist or athletic trainer. Progress the resistance and repetitions only as guided.  STRENGTH - Towel Curls Sit in a chair positioned on a non-carpeted surface. Place your foot on a towel, keeping your heel on the floor. Pull the towel toward your heel by only curling your toes. Keep your heel on the floor. Repeat 3 times. Complete this exercise 2 times per day.  STRENGTH - Ankle Inversion Secure one end of a rubber exercise band/tubing to a fixed object (table, pole). Loop the other end around your foot just before your toes. Place your fists between your knees. This will focus your strengthening at your ankle. Slowly, pull your big toe up and in, making sure the band/tubing is positioned to resist the entire motion. Hold this position for 10 seconds. Have your muscles resist the band/tubing as it slowly pulls your foot back to the starting position. Repeat 3 times. Complete this exercises 2 times per day.  Document Released:  03/01/2005 Document Revised: 05/24/2011 Document Reviewed: 06/13/2008 [facility] Patient Information 2014 [place], [place].

## 2023-11-09 NOTE — Progress Notes (Signed)
 Subjective:  Patient ID: Carlos Gutierrez, male    DOB: 18-Dec-1950,  MRN: [id number]  Chief Complaint  Patient presents with   Foot Pain    Left heel pain for about a month and just wanted to have the right foot looked at     Discussed the use of AI scribe software for clinical note transcription with the patient, who gave verbal consent to proceed.  History of Present Illness Carlos Gutierrez is a 73 year old male who presents with right foot and left heel pain.  His right big toe deviates laterally, and the second toe remains in a fixed position. He recalls a previous discussion about possibly removing something from the second toe.  Left heel pain has persisted for approximately three and a half to four weeks, beginning after regular walking and hiking. Despite resting the heel, the pain continues. Swelling is present on the left side, with fluid retention in the left leg, but no ankle pain.  He uses ice on the heel and has attempted exercises, but the pain remains localized to the heel area. He is on two blood pressure medications and has an allergy to metformin.      Objective:    Physical Exam General: AAO x3, NAD  Dermatological: Skin is warm, dry and supple bilateral. There are no open sores, no preulcerative lesions, no rash or signs of infection present.  Vascular: Dorsalis Pedis artery and Posterior Tibial artery pedal pulses are 2/4 bilateral with immedate capillary fill time.  Pitting edema present bilaterally left side worse than right.  No pain with calf compression, erythema or warmth.  Neruologic: Grossly intact via light touch bilateral.   Musculoskeletal: Usual hallux abductus noted on the right side second toe was sitting dorsiflexed at the MPJ on the right side.  Mild restriction of first MTPJ range of motion.  The left side there is tenderness palpation along the plantar medial tubercle of the calcaneus at the insertion of the plantar fascia.  There is  no pain with lateral compression of calcaneus.  No pain in the Achilles tendon.  Gait: Unassisted, Nonantalgic.     No images are attached to the encounter.    Results RADIOLOGY Foot X-ray: Arthritic changes in the first metatarsophalangeal joint with joint space narrowing and osteophyte formation. Calcaneus appears normal, creased calcaneal inclination angle.   Assessment:   1. Valgus deformity of both great toes   2. Plantar fasciitis, left      Plan:  Patient was evaluated and treated and all questions answered.  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 Joint retains some movement. Goal to prevent further deviation. Joint fusion considered if worsens. - Provide splint to prevent further deviation. - Advise against screw removal unless painful. - If becomes more symptomatic would likely need to have MPJ arthrodesis.  Right second toe post-surgical deformity Post-surgical deformity managed conservatively. - Provide splint to maintain alignment.  Left heel pain due to muscle strain (plantar fasciitis-like) Heel pain due to muscle strain, similar to plantar fasciitis. Exacerbated by flat foot and poor shoe support. X-ray normal heel bone, arthritis in big toe joint. - Administer steroid injection.  See procedure note below. - Advise daily icing and rolling exercises with frozen water bottle. - Provide exercises to stretch calf, Achilles, and foot muscles. - Recommend shoe inserts to raise arch. - Provide brace for additional support. - Advise against anti-inflammatories due to  blood pressure and swelling concerns.  Lower extremity edema Edema possibly related to blood pressure medications. No diuretics used. Swelling could be due to fluid retention from medications. - Discuss with primary care provider regarding medication-related edema.   Procedure: Injection  Tendon/Ligament Discussed alternatives, risks, complications and verbal consent was obtained.  Location: Left plantar fascia at the glabrous junction; medial approach. Skin Prep: Alcohol. Injectate: 0.5cc 0.5% marcaine plain, 0.5 cc 2% lidocaine  plain and, 1 cc kenalog  10. Disposition: Patient tolerated procedure well. Injection site dressed with a band-aid.  Post-injection care was discussed and return precautions discussed.     Return in about 2 months (around 01/07/2024).

## 2023-12-23 ENCOUNTER — Other Ambulatory Visit: Payer: Self-pay | Admitting: Physician Assistant

## 2023-12-25 ENCOUNTER — Encounter: Payer: Self-pay | Admitting: Podiatry

## 2023-12-27 ENCOUNTER — Ambulatory Visit: Admitting: Podiatry

## 2023-12-27 ENCOUNTER — Encounter: Payer: Self-pay | Admitting: Podiatry

## 2023-12-27 DIAGNOSIS — M722 Plantar fascial fibromatosis: Secondary | ICD-10-CM

## 2023-12-27 MED ORDER — TRIAMCINOLONE ACETONIDE 10 MG/ML IJ SUSP
5.0000 mg | Freq: Once | INTRAMUSCULAR | Status: AC
  Administered 2023-12-27: 5 mg via INTRAMUSCULAR

## 2023-12-27 NOTE — Progress Notes (Signed)
 Subjective: Chief Complaint  Patient presents with   Plantar Fasciitis    Rm12 Plantar fasciitis flare left foot that started on Sunday/ Pt has tried dme, otc inserts and stretches with no improvement.   73 year old male presents the office today for concerns of recurrent pain to his left foot.  He states the last injection was helpful.  He does start to wear his inserts but he did not break them and it was more painful.  He has been doing the stretching, icing on regular basis.  He also uses warm water and Epsom salts.  No injuries.  No new concerns otherwise.  Objective: AAO x3, NAD DP/PT pulses palpable bilaterally, CRT less than 3 seconds Tenderness to palpation along the plantar medial tubercle of the calcaneus at the insertion of plantar fascia on the left foot. There is no pain along the course of the plantar fascia within the arch of the foot. Plantar fascia appears to be intact. There is no pain with lateral compression of the calcaneus or pain with vibratory sensation. There is no pain along the course or insertion of the achilles tendon. No other areas of tenderness to bilateral lower extremities.  Negative Tinel's sign. No pain with calf compression, swelling, warmth, erythema  Assessment: Left heel pain, plantar fasciitis  Plan: -All treatment options discussed with the patient including all alternatives, risks, complications.  -Discussed second steroid injection.  He wishes to proceed.  Verbal consent obtained.  See procedure note below. -Discussed continue shoes, inserts.  Discussed break-in period for inserts. -Continue stretching, icing on a regular basis.  Continue with plantar fascia brace. -If needed consider physical therapy, shockwave or PRP injection. -Patient encouraged to call the office with any questions, concerns, change in symptoms.   Procedure: Injection Tendon/Ligament Discussed alternatives, risks, complications and verbal consent was obtained.  Location:  Left plantar fascia at the glabrous junction; medial approach. Skin Prep: Alcohol. Injectate: 0.5cc 0.5% marcaine plain, 0.5 cc 2% lidocaine  plain and, 1 cc kenalog  10. Disposition: Patient tolerated procedure well. Injection site dressed with a band-aid.  Post-injection care was discussed and return precautions discussed.   No follow-ups on file.  Donnice JONELLE Fees DPM

## 2023-12-27 NOTE — Patient Instructions (Addendum)
 While at your visit today you received a steroid injection in your foot or ankle to help with your pain. Along with having the steroid medication there is some numbing medication in the shot that you received. Due to this you may notice some numbness to the area for the next couple of hours.   I would recommend limiting activity for the next few days to help the steroid injection take affect.    The actually benefit from the steroid injection may take up to 2-7 days to see a difference. You may actually experience a small (as in 10%) INCREASE in pain in the first 24 hours---that is common. It would be best if you can ice the area today and take anti-inflammatory medications (such as Ibuprofen, Motrin, or Aleve) if you are able to take these medications. If you were prescribed another medication to help with the pain go ahead and start that medication today    Things to watch out for that you should contact us  or a health care provider urgently would include: 1. Unusual (as in more than 10%) increase in pain 2. New fever > 101.5 3. New swelling or redness of the injected area.  4. Streaking of red lines around the area injected.  If you have any questions or concerns about this, please give our office a call at [phone].     --  Plantar Fasciitis (Heel Spur Syndrome) with Rehab The plantar fascia is a fibrous, ligament-like, soft-tissue structure that spans the bottom of the foot. Plantar fasciitis is a condition that causes pain in the foot due to inflammation of the tissue. SYMPTOMS  Pain and tenderness on the underneath side of the foot. Pain that worsens with standing or walking. CAUSES  Plantar fasciitis is caused by irritation and injury to the plantar fascia on the underneath side of the foot. Common mechanisms of injury include: Direct trauma to bottom of the foot. Damage to a small nerve that runs under the foot where the main fascia attaches to the heel bone. Stress placed  on the plantar fascia due to bone spurs. RISK INCREASES WITH:  Activities that place stress on the plantar fascia (running, jumping, pivoting, or cutting). Poor strength and flexibility. Improperly fitted shoes. Tight calf muscles. Flat feet. Failure to warm-up properly before activity. Obesity. PREVENTION Warm up and stretch properly before activity. Allow for adequate recovery between workouts. Maintain physical fitness: Strength, flexibility, and endurance. Cardiovascular fitness. Maintain a health body weight. Avoid stress on the plantar fascia. Wear properly fitted shoes, including arch supports for individuals who have flat feet.  PROGNOSIS  If treated properly, then the symptoms of plantar fasciitis usually resolve without surgery. However, occasionally surgery is necessary.  RELATED COMPLICATIONS  Recurrent symptoms that may result in a chronic condition. Problems of the lower back that are caused by compensating for the injury, such as limping. Pain or weakness of the foot during push-off following surgery. Chronic inflammation, scarring, and partial or complete fascia tear, occurring more often from repeated injections.  TREATMENT  Treatment initially involves the use of ice and medication to help reduce pain and inflammation. The use of strengthening and stretching exercises may help reduce pain with activity, especially stretches of the Achilles tendon. These exercises may be performed at home or with a therapist. Your caregiver may recommend that you use heel cups of arch supports to help reduce stress on the plantar fascia. Occasionally, corticosteroid injections are given to reduce inflammation. If symptoms persist for greater than  6 months despite non-surgical (conservative), then surgery may be recommended.   MEDICATION  If pain medication is necessary, then nonsteroidal anti-inflammatory medications, such as aspirin  and ibuprofen, or other minor pain relievers, such  as acetaminophen , are often recommended. Do not take pain medication within 7 days before surgery. Prescription pain relievers may be given if deemed necessary by your caregiver. Use only as directed and only as much as you need. Corticosteroid injections may be given by your caregiver. These injections should be reserved for the most serious cases, because they may only be given a certain number of times.  HEAT AND COLD Cold treatment (icing) relieves pain and reduces inflammation. Cold treatment should be applied for 10 to 15 minutes every 2 to 3 hours for inflammation and pain and immediately after any activity that aggravates your symptoms. Use ice packs or massage the area with a piece of ice (ice massage). Heat treatment may be used prior to performing the stretching and strengthening activities prescribed by your caregiver, physical therapist, or athletic trainer. Use a heat pack or soak the injury in warm water.  SEEK IMMEDIATE MEDICAL CARE IF: Treatment seems to offer no benefit, or the condition worsens. Any medications produce adverse side effects.  EXERCISES- RANGE OF MOTION (ROM) AND STRETCHING EXERCISES - Plantar Fasciitis (Heel Spur Syndrome) These exercises may help you when beginning to rehabilitate your injury. Your symptoms may resolve with or without further involvement from your physician, physical therapist or athletic trainer. While completing these exercises, remember:  Restoring tissue flexibility helps normal motion to return to the joints. This allows healthier, less painful movement and activity. An effective stretch should be held for at least 30 seconds. A stretch should never be painful. You should only feel a gentle lengthening or release in the stretched tissue.  RANGE OF MOTION - Toe Extension, Flexion Sit with your right / left leg crossed over your opposite knee. Grasp your toes and gently pull them back toward the top of your foot. You should feel a stretch  on the bottom of your toes and/or foot. Hold this stretch for 10 seconds. Now, gently pull your toes toward the bottom of your foot. You should feel a stretch on the top of your toes and or foot. Hold this stretch for 10 seconds. Repeat  times. Complete this stretch 3 times per day.   RANGE OF MOTION - Ankle Dorsiflexion, Active Assisted Remove shoes and sit on a chair that is preferably not on a carpeted surface. Place right / left foot under knee. Extend your opposite leg for support. Keeping your heel down, slide your right / left foot back toward the chair until you feel a stretch at your ankle or calf. If you do not feel a stretch, slide your bottom forward to the edge of the chair, while still keeping your heel down. Hold this stretch for 10 seconds. Repeat 3 times. Complete this stretch 2 times per day.   STRETCH  Gastroc, Standing Place hands on wall. Extend right / left leg, keeping the front knee somewhat bent. Slightly point your toes inward on your back foot. Keeping your right / left heel on the floor and your knee straight, shift your weight toward the wall, not allowing your back to arch. You should feel a gentle stretch in the right / left calf. Hold this position for 10 seconds. Repeat 3 times. Complete this stretch 2 times per day.  STRETCH  Soleus, Standing Place hands on wall. Extend right /  left leg, keeping the other knee somewhat bent. Slightly point your toes inward on your back foot. Keep your right / left heel on the floor, bend your back knee, and slightly shift your weight over the back leg so that you feel a gentle stretch deep in your back calf. Hold this position for 10 seconds. Repeat 3 times. Complete this stretch 2 times per day.  STRETCH  Gastrocsoleus, Standing  Note: This exercise can place a lot of stress on your foot and ankle. Please complete this exercise only if specifically instructed by your caregiver.  Place the ball of your right / left  foot on a step, keeping your other foot firmly on the same step. Hold on to the wall or a rail for balance. Slowly lift your other foot, allowing your body weight to press your heel down over the edge of the step. You should feel a stretch in your right / left calf. Hold this position for 10 seconds. Repeat this exercise with a slight bend in your right / left knee. Repeat 3 times. Complete this stretch 2 times per day.   STRENGTHENING EXERCISES - Plantar Fasciitis (Heel Spur Syndrome)  These exercises may help you when beginning to rehabilitate your injury. They may resolve your symptoms with or without further involvement from your physician, physical therapist or athletic trainer. While completing these exercises, remember:  Muscles can gain both the endurance and the strength needed for everyday activities through controlled exercises. Complete these exercises as instructed by your physician, physical therapist or athletic trainer. Progress the resistance and repetitions only as guided.  STRENGTH - Towel Curls Sit in a chair positioned on a non-carpeted surface. Place your foot on a towel, keeping your heel on the floor. Pull the towel toward your heel by only curling your toes. Keep your heel on the floor. Repeat 3 times. Complete this exercise 2 times per day. 71 STRENGTH - Ankle Inversion Secure one end of a rubber exercise band/tubing to a fixed object (table, pole). Loop the other end around your foot just before your toes. Place your fists between your knees. This will focus your strengthening at your ankle. Slowly, pull your big toe up and in, making sure the band/tubing is positioned to resist the entire motion. Hold this position for 10 seconds. Have your muscles resist the band/tubing as it slowly pulls your foot back to the starting position. Repeat 3 times. Complete this exercises 2 times per day.  Document Released: 03/01/2005 Document Revised: 05/24/2011 Document Reviewed:  06/13/2008 [facility] Patient Information 2014 [place], [place].  WEARING INSTRUCTIONS FOR ORTHOTICS  Don't expect to be comfortable wearing your orthotic devices for the first time.  Like eyeglasses, you may be aware of them as time passes, they will not be uncomfortable and you will enjoy wearing them.  FOLLOW THESE INSTRUCTIONS EXACTLY!  Wear your orthotic devices for:       Not more than 1 hour the first day.       Not more than 2 hours the second day.       Not more than 3 hours the third day and so on.        Or wear them for as long as they feel comfortable.       If you experience discomfort in your feet or legs take them out.  When feet & legs feel       better, put them back in.  You do need to be consistent and wear them  a little        everyday. 2.   If at any time the orthotic devices become acutely uncomfortable before the       time for that particular day, STOP WEARING THEM. 3.   On the next day, do not increase the wearing time. 4.   Subsequently, increase the wearing time by 15-30 minutes only if comfortable to do       so. 5.   You will be seen by your doctor about 2-4 weeks after you receive your orthotic       devices, at which time you will probably be wearing your devices comfortably        for about 8 hours or more a day. 6.   Some patients occasionally report mild aches or discomfort in other parts of the of       body such as the knees, hips or back after 3 or 4 consecutive hours of wear.  If this       is the case with you, do not extend your wearing time.  Instead, cut it back an hour or       two.  In all likelihood, these symptoms will disappear in a short period of time as your       body posture realigns itself and functions more efficiently. 7.   It is possible that your orthotic device may require some small changes or adjustment       to improve their function or make them more comfortable.   This is usually not done       before one to three months  have elapsed.  These adjustments are made in        accordance with the changed position your feet are assuming as a result of       improved biomechanical function. 8.   In women's shoes, it's not unusual for your heel to slip out of the shoe, particularly if       they are step-in-shoes.  If this is the case, try other shoes or other styles.  Try to       purchase shoes which have deeper heal seats or higher heel counters. 9.   Squeaking of orthotics devices in the shoes is due to the movement of the devices       when they are functioning normally.  To eliminate squeaking, simply dust some       baby powder into your shoes before inserting the devices.  If this does not work,        apply soap or wax to the edges of the orthotic devices or put a tissue into the shoes. 10. It is important that you follow these directions explicitly.  Failure to do so will simply       prolong the adjustment period or create problems which are easily avoided.  It makes       no difference if you are wearing your orthotic devices for only a few hours after        several months, so long as you are wearing them comfortably for those hours. 11. If you have any questions or complaints, contact our office.  We have no way of       knowing about your problems unless you tell us .  If we do not hear from you, we will       assume that you are proceeding well.

## 2024-01-09 ENCOUNTER — Ambulatory Visit: Admitting: Podiatry

## 2024-01-22 ENCOUNTER — Other Ambulatory Visit: Payer: Self-pay | Admitting: Interventional Cardiology

## 2024-02-22 ENCOUNTER — Other Ambulatory Visit: Payer: Self-pay | Admitting: Physician Assistant

## 2024-02-29 ENCOUNTER — Encounter: Payer: Self-pay | Admitting: Physician Assistant

## 2024-02-29 ENCOUNTER — Ambulatory Visit: Attending: Physician Assistant | Admitting: Physician Assistant

## 2024-02-29 VITALS — BP 140/80 | HR 60 | Ht 73.0 in | Wt 226.0 lb

## 2024-02-29 DIAGNOSIS — I10 Essential (primary) hypertension: Secondary | ICD-10-CM | POA: Diagnosis present

## 2024-02-29 DIAGNOSIS — I2510 Atherosclerotic heart disease of native coronary artery without angina pectoris: Secondary | ICD-10-CM | POA: Diagnosis present

## 2024-02-29 DIAGNOSIS — E119 Type 2 diabetes mellitus without complications: Secondary | ICD-10-CM | POA: Insufficient documentation

## 2024-02-29 DIAGNOSIS — R001 Bradycardia, unspecified: Secondary | ICD-10-CM | POA: Diagnosis present

## 2024-02-29 DIAGNOSIS — I480 Paroxysmal atrial fibrillation: Secondary | ICD-10-CM | POA: Diagnosis present

## 2024-02-29 DIAGNOSIS — E7800 Pure hypercholesterolemia, unspecified: Secondary | ICD-10-CM | POA: Diagnosis present

## 2024-02-29 NOTE — Assessment & Plan Note (Addendum)
 History of intermittent complete heart block.  He has a long first-degree block on EKG.  He is not symptomatic.  He has not had syncope, near syncope or exercise intolerance.  He can get his heart rate up into the 130s during exercise.  Continue to avoid AV nodal blocking agents.

## 2024-02-29 NOTE — Assessment & Plan Note (Addendum)
 Moderate nonobstructive coronary artery diseaseby cardiac catheterization in 2021. PET MPI in 2023 was low risk. He has not had chest pain to suggest angina. He works out regularly and has not had exercise intolerance. He has some shoulder pain that seems to be related to arthritis. We discussed +/- ETT. However, with his activity level and lack of symptoms that sound cardiac, I do not think this is necessary. He agrees.  - Continue ASA 81 mg daily, Lipitor  80 mg daily, nitroglycerin  as needed - Follow-up 6 months

## 2024-02-29 NOTE — Assessment & Plan Note (Addendum)
Recent LDL optimal.  Continue atorvastatin 80 mg daily.

## 2024-02-29 NOTE — Assessment & Plan Note (Addendum)
 Blood pressure somewhat elevated today.  He had a salty meal yesterday.  Otherwise blood pressures have been well-controlled at home. - Continue amlodipine  10 mg daily, olmesartan  40 mg daily - Continue to monitor at home

## 2024-02-29 NOTE — Assessment & Plan Note (Addendum)
 Recent A1c optimal.

## 2024-02-29 NOTE — Patient Instructions (Signed)
 Medication Instructions:  Your physician recommends that you continue on your current medications as directed. Please refer to the Current Medication list given to you today. *If you need a refill on your cardiac medications before your next appointment, please call your pharmacy*  Lab Work: None ordered If you have labs (blood work) drawn today and your tests are completely normal, you will receive your results only by: MyChart Message (if you have MyChart) OR A paper copy in the mail If you have any lab test that is abnormal or we need to change your treatment, we will call you to review the results.  Testing/Procedures: None ordered  Follow-Up: At [facility], you and your health needs are our priority.  As part of our continuing mission to provide you with exceptional heart care, our providers are all part of one team.  This team includes your primary Cardiologist (physician) and Advanced Practice Providers or APPs (Physician Assistants and Nurse Practitioners) who all work together to provide you with the care you need, when you need it.  Your next appointment:   6 month(s)  Provider:   Lonni Cash, MD    We recommend signing up for the patient portal called MyChart.  Sign up information is provided on this After Visit Summary.  MyChart is used to connect with patients for Virtual Visits (Telemedicine).  Patients are able to view lab/test results, encounter notes, upcoming appointments, etc.  Non-urgent messages can be sent to your provider as well.   To learn more about what you can do with MyChart, go to [email or web address].   Other Instructions

## 2024-02-29 NOTE — Assessment & Plan Note (Addendum)
 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 He is maintaining NSR. He is tolerating anticoagulation well. Creatinine clearance is 86 mL/min. 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 Therefore, he is not on AV nodal blocking agents.  - Continue Xarelto  20 mg daily.

## 2024-02-29 NOTE — Progress Notes (Signed)
 OFFICE NOTE:    Date:  02/29/2024  ID:  Carlos Gutierrez, DOB 10-Feb-1951, MRN [id number] PCP: Kip Righter, MD  [facility] Providers Cardiologist:  Lonni Cash, MD Cardiology APP:  Lelon Glendia ONEIDA DEVONNA Lelon Glendia ONEIDA, [place]  Electrophysiologist:  OLE ONEIDA HOLTS, MD        Paroxysmal atrial fibrillation/flutter   S/p CTI ablation 2003 S/p PVI ablation (Dr. Holts) 07/2020 Previously on Dofetilide  >> DC'd  TTE 09/21/2016: Mild LVH, EF 55-60, no RWMA, Gr 2 DD, mild LAE  TTE 04/18/2019: EF 50-55, no RWMA, normal RVSF, trivial TR, trivial PI Coronary artery disease (mod nonobstructive) LHC 02/11/2017: Ostial LAD 50, ostial RI 65, ostial OM1 60, proximal RCA 40, distal RCA 70 LHC 04/18/2019: Mid LM 40, ostial LAD 50, ostial RI 65, ostial OM1 60, proximal RCA 40, distal RCA 70 PET MPI 07/28/2021: No ischemia or infarction, EF 50, global MBFR 2.3 (normal); low risk Bradycardia  Monitor 11/2022: 2 episodes of CHB, HR 24-108 EP eval - plan to monitor for symptoms Diabetes mellitus  Hypertension  Hx of DVT  Hx of pulmonary embolism  OSA  Hx of LGI bleed  Cirrhosis 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 Rollin GERD         Discussed the use of AI scribe software for clinical note transcription with the patient, who gave verbal consent to proceed. History of Present Illness Carlos Gutierrez is a 73 y.o. male for follow up of CAD, AFib. He is a former pt of Dr. Claudene and last saw him in 01/2022. He has been followed by Dr. Holts for EP. He was last seen in 01/2023. No intervention was felt to be necessary for bradycardia unless he becomes asymptomatic. He is not on AV nodal blockers.   He is here alone. He has no symptoms of angina or exertional limitations and engages in regular aerobic exercise with a trainer, achieving a heart rate of up to 134 bpm at times without issues. He has not had dizziness, syncope, or exercise intolerance. He has chronic shoulder  pain, particularly in the left shoulder, attributed to arthritis. The pain occurs in the mornings and sometimes during sleep. It is exacerbated by certain positions. He experiences occasional swelling in his left ankle, which he attributes to a past DVT diagnosed in 2019. The swelling resolves with elevation and he uses compression hose when flying.  He retired in 06-06-23 and has been traveling and spending time with family, including grandchildren. He will be going to [place]  for Christmas and has a cruise planned in [place] in June 06, 2023. His wife passed away 2 years ago. He now has a partner and they have purchased a new home together.    Review of Systems  Cardiovascular:  Negative for claudication.  -See HPI    Studies Reviewed:  EKG Interpretation Date/Time:  Wednesday February 29 2024 13:39:54 EST Ventricular Rate:  60 PR Interval:  320 QRS Duration:  104 QT Interval:  398 QTC Calculation: 398 R Axis:   31  Text Interpretation: Sinus rhythm with 1st degree A-V block When compared with ECG of 02-Feb-2023 16:20, No significant change was found Confirmed by Lelon Glendia [phone]) on 02/29/2024 2:00:38 PM    LABS 12/02/2022: K 4, creatinine 1.14, eGFR >60, ALT 28, Hgb 14.9, PLT 123K, TSH 3.358 Results Labs Hemoglobin 15.3 (08/11/2023) GFR (01/2024): 91 (08/11/2023) K 4.2 (08/11/2023) LDL (08/09/2023): 69 Total cholesterol (08/09/2023): 139 HDL (08/09/2023): 54 TSH (94/70/7974): 2.78  Magnesium  (  08/11/2023): 2.1 Hemoglobin A1c (02/05/2024): 6.3 Microalbumin to creatinine ratio (02/05/2024): 3.8 mg/g Platelet count (08/11/2023): 168,000 Creatinine (08/11/2023): 0.89 ALT (08/11/2023): 30   Creatinine clearance: 86   Risk Assessment/Calculations: CHA2DS2-VASc Score = 4   This indicates a 4.8% annual risk of stroke. The patient's score is based upon: CHF History: 0 HTN History: 1 Diabetes History: 1 Stroke History: 0 Vascular Disease History: 1 Age Score: 1 Gender  Score: 0     HYPERTENSION CONTROL Vitals:   02/29/24 1331 02/29/24 1442  BP: (!) 142/76 (!) 140/80    The patient's blood pressure is elevated above target today.  In order to address the patient's elevated BP: Blood pressure will be monitored at home to determine if medication changes need to be made.         Physical Exam:  VS:  BP (!) 140/80   Pulse 60   Ht 6' 1 (1.854 m)   Wt 226 lb (102.5 kg)   SpO2 97%   BMI 29.82 kg/m        Wt Readings from Last 3 Encounters:  02/29/24 226 lb (102.5 kg)  02/02/23 223 lb 6.4 oz (101.3 kg)  01/03/23 224 lb 3.2 oz (101.7 kg)    Constitutional:      Appearance: Healthy appearance. Not in distress.  Neck:     Vascular: No carotid bruit. JVD normal.  Pulmonary:     Breath sounds: Normal breath sounds. No wheezing. No rales.  Cardiovascular:     Normal rate. Regular rhythm.     Murmurs: There is no murmur.  Edema:    Peripheral edema absent.  Abdominal:     Palpations: Abdomen is soft.       Assessment and Plan:    Assessment & Plan Paroxysmal atrial fibrillation [facility]) Status post PVI ablation in May 2020. He is maintaining NSR. He is tolerating anticoagulation well. Creatinine clearance is 86 mL/min. 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 Therefore, he is not on AV nodal blocking agents.  - Continue Xarelto  20 mg daily. Coronary artery disease involving native heart without angina pectoris, unspecified vessel or lesion type Moderate nonobstructive coronary artery diseaseby cardiac catheterization in 2021. PET MPI in 2023 was low risk. He has not had chest pain to suggest angina. He works out regularly and has not had exercise intolerance. He has some shoulder pain that seems to be related to arthritis. We discussed +/- ETT. However, with his activity level and lack of symptoms that sound cardiac, I do not think this is necessary. He agrees.  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  He has a long first-degree block on EKG.  He is not symptomatic.  He has not had syncope, near syncope or exercise intolerance.  He can get his heart rate up into the 130s during exercise.  Continue to avoid AV nodal blocking agents. Primary hypertension Blood pressure somewhat elevated today.  He had a salty meal yesterday.  Otherwise blood pressures have been well-controlled at home. - Continue amlodipine  10 mg daily, olmesartan  40 mg daily - Continue to monitor at home Pure hypercholesterolemia Recent LDL optimal. - Continue atorvastatin  80 mg daily Type 2 diabetes mellitus without complication, without long-term current use of insulin  (HCC) Recent A1c optimal.         Dispo:  Return in about 6 months (around 08/29/2024) for  Routine Follow Up, w/ Dr. Verlin.  Signed, Glendia Ferrier, PA-C

## 2024-03-23 ENCOUNTER — Other Ambulatory Visit: Payer: Self-pay | Admitting: Student

## 2024-03-29 NOTE — Progress Notes (Signed)
 " Cardiology Office Note:  .   Date:  03/29/2024  ID:  Carlos, Gutierrez Sep 03, 1950, MRN [id number] PCP: Carlos Righter, MD  [facility] Providers Cardiologist:  Carlos Cash, MD Cardiology APP:  Carlos Gutierrez Carlos Carlos Gutierrez ONEIDA, PA-C  Electrophysiologist:  Carlos Gutierrez HOLTS, MD (Inactive) {  History of Present Illness: .   Carlos Gutierrez is a 74 y.o. male w/PMHx of  CAD (non-obstructive), HTN, HLD, DM, OSA w/CPAP Cirrhosis, GIB, GERD AFlutter (ablated 2003), and AFib, DVT  He saw Dr. Holts Dec 2023, doing well, post ablation and off Tikosyn , planned to maintain [facility]  He was hospitalized 12/01/22 for bradycardia during foot surgery, associated with anesthesia/hypotension. (At a outpt surgical center) Cardiology consulted  Initial EKG appears to be second-degree AV block type II.  Review of telemetry shows episodes of bradycardia with heart rate dropping as low as 29.  Intermittent episodes of second-degree AV block type I and type II.  HR and conduction recovered Planned for obs monitoring Had nocturnal Mobitz one Planned for d/c w/ monitoring  I saw him 01/03/23 He is doing well Gets the pin removed and boot off next week (hopefully) He denies any CP, palpitations or cardiac awareness No AFib No dizzy spells, near syncope or syncope No bleeding or signs of bleeding Prior to his foot surgery: Going the gym 3-4x/week, walking 3-4 miles regularly as well with excellent exertional capacity Since home from the hospital no symptoms of bradycardia He finds his HR 50's mostly and 60's No symptoms of bradycardia Discussed symptoms to seek attention for Plan to avoid nodal blocking agents 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 Holts  He saw Dr. Holts 02/02/23, gastroenterologist had recommended a beta blocker, but it was decided that it was not a necessity if a cardiac condition would limit its use  Added  amlodipine  for BP  He saw Carlos Gutierrez 02/29/24, denied any symptoms of angina, or bradycardia.  Reported HRs towards 130's with exercise.  Retired, travelling. Advised monitoring BP (slightly elevated at this visit)  Today's visit is scheduled as a a 6 mo visit ROS:   He continues to do very well No new complaints/concerns since his visit with Carlos Gutierrez. Golfs (weather permitting), exercises regularly No AFib  Ran out of refills of his protonix , went ahead and started OTC but was uncertain how he should dose/take it. I advised he take it as the prescribed protonix  was written/reach out to his PMD for guidance/recommendations  Arrhythmia/AAD hx Flecainide  (pill in pocket) stopped 2021 with progression CAD (remaining non-obstructive) Tikosyn  started Feb 2021 > stopped May 2023 PVI ablation 07/18/21 (evidence of prior CTI block)  Studies Reviewed: SABRA    EKG not done today  07/18/20: EPS/ablation CONCLUSIONS: 1. Successful PVI 2. CTI block, evidence of prior CTI ablation 3. Intracardiac echo reveals normal LA architecture and trivial pericardial effusion 4. No early apparent complications   PET stress  Jul 28, 2021 and showed no findings concerning for significant ischemia   LHC 04/18/19  30 to 40% distal left main, which is new/progressed from 2018. Luminal irregularities in the LAD mid and distal. First obtuse marginal 50% tandem proximal to mid stenoses. 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  Unchanged compared to prior images.   04/18/2019: TTE 1. Left ventricular ejection fraction, by visual estimation, is 50 to  55%. The left ventricle has  normal function. There is no left ventricular  hypertrophy.   2. Definity  contrast agent was given IV to delineate the left ventricular  endocardial borders.   3. The left ventricle has no regional wall motion abnormalities.   4. Left ventricular diastolic parameters are indeterminate.    5. Global right ventricle has normal systolic function.The right  ventricular size is not well visualized. Right vetricular wall thickness  was not assessed.   6. Left atrial size was normal.   7. Right atrial size was normal.   8. The mitral valve is normal in structure. No evidence of mitral valve  regurgitation. No evidence of mitral stenosis.   9. The tricuspid valve is normal in structure. Tricuspid valve  regurgitation is trivial.  10. The aortic valve is grossly normal. Aortic valve regurgitation is not  visualized. No evidence of aortic valve sclerosis or stenosis.  11. The pulmonic valve was normal in structure. Pulmonic valve  regurgitation is trivial.  12. The inferior vena cava is normal in size with greater than 50%  respiratory variability, suggesting right atrial pressure of 3 mmHg.  13. TR signal is inadequate for assessing pulmonary artery systolic  pressure.     Risk Assessment/Calculations:    Physical Exam:   VS:  There were no vitals taken for this visit.   Wt Readings from Last 3 Encounters:  02/29/24 226 lb (102.5 kg)  02/02/23 223 lb 6.4 oz (101.3 kg)  01/03/23 224 lb 3.2 oz (101.7 kg)    GEN: Well nourished, well developed in no acute distress NECK: No JVD; No carotid bruits CARDIAC: RRR, no murmurs, rubs, gallops RESPIRATORY: CTA b/l without rales, wheezing or rhonchi  ABDOMEN: Soft, non-tender, non-distended EXTREMITIES: No edema; No deformity    ASSESSMENT AND PLAN: .    persistent AFib CHA2DS2Vasc is 6, on Xarelto , appropriately dosed No/low burden by symptoms  Discussed Dr. Hiram relocation/depature from our service, he was aware Given stability of his AFib post ablation, offerred to see him PRN from an EP perspective though he felt most comfortable staying on with us . Plan to transition to Dr. Kennyth  CAD, (non-obstructive by cath) No symptoms He reports being found with cirrhosis (non ETOH related) C/w Dr.  McAlhany/team   HTN Looks good  Secondary hypercoagulable state 2/2 AFib    Dispo: in clinic with EP again in a year, sooner if needed  Signed, Carlos Macario Arthur, PA-C   "

## 2024-03-30 ENCOUNTER — Ambulatory Visit: Attending: Physician Assistant | Admitting: Physician Assistant

## 2024-03-30 VITALS — BP 124/64 | HR 88 | Ht 73.0 in | Wt 221.0 lb

## 2024-03-30 DIAGNOSIS — D6869 Other thrombophilia: Secondary | ICD-10-CM | POA: Diagnosis not present

## 2024-03-30 DIAGNOSIS — I2510 Atherosclerotic heart disease of native coronary artery without angina pectoris: Secondary | ICD-10-CM | POA: Insufficient documentation

## 2024-03-30 DIAGNOSIS — I4819 Other persistent atrial fibrillation: Secondary | ICD-10-CM | POA: Insufficient documentation

## 2024-03-30 DIAGNOSIS — I10 Essential (primary) hypertension: Secondary | ICD-10-CM | POA: Diagnosis present

## 2024-03-30 NOTE — Patient Instructions (Signed)
 Medication Instructions:   Your physician recommends that you continue on your current medications as directed. Please refer to the Current Medication list given to you today.  *If you need a refill on your cardiac medications before your next appointment, please call your pharmacy*  Lab Work: NONE ORDERED  TODAY    If you have labs (blood work) drawn today and your tests are completely normal, you will receive your results only by: MyChart Message (if you have MyChart) OR A paper copy in the mail If you have any lab test that is abnormal or we need to change your treatment, we will call you to review the results.  Testing/Procedures:  NONE ORDERED  TODAY    Follow-Up: At [facility], you and your health needs are our priority.  As part of our continuing mission to provide you with exceptional heart care, our providers are all part of one team.  This team includes your primary Cardiologist (physician) and Advanced Practice Providers or APPs (Physician Assistants and Nurse Practitioners) who all work together to provide you with the care you need, when you need it.  Your next appointment:   1 year(s)  Provider:   Fonda Kitty, MD     We recommend signing up for the patient portal called MyChart.  Sign up information is provided on this After Visit Summary.  MyChart is used to connect with patients for Virtual Visits (Telemedicine).  Patients are able to view lab/test results, encounter notes, upcoming appointments, etc.  Non-urgent messages can be sent to your provider as well.   To learn more about what you can do with MyChart, go to [email or web address].   Other Instructions
